# Patient Record
Sex: Female | Born: 1980 | Race: White | Hispanic: No | Marital: Married | Smoking: Never smoker
Health system: Southern US, Community
[De-identification: ages and names within clinical notes are randomized; demographics above are authoritative.]

## PROBLEM LIST (undated history)

## (undated) DIAGNOSIS — N390 Urinary tract infection, site not specified: Secondary | ICD-10-CM

## (undated) DIAGNOSIS — K760 Fatty (change of) liver, not elsewhere classified: Secondary | ICD-10-CM

## (undated) DIAGNOSIS — L509 Urticaria, unspecified: Secondary | ICD-10-CM

## (undated) DIAGNOSIS — R946 Abnormal results of thyroid function studies: Secondary | ICD-10-CM

## (undated) DIAGNOSIS — E782 Mixed hyperlipidemia: Secondary | ICD-10-CM

## (undated) DIAGNOSIS — J309 Allergic rhinitis, unspecified: Secondary | ICD-10-CM

## (undated) DIAGNOSIS — R5383 Other fatigue: Secondary | ICD-10-CM

## (undated) DIAGNOSIS — G43909 Migraine, unspecified, not intractable, without status migrainosus: Secondary | ICD-10-CM

## (undated) DIAGNOSIS — M25552 Pain in left hip: Secondary | ICD-10-CM

## (undated) DIAGNOSIS — A549 Gonococcal infection, unspecified: Secondary | ICD-10-CM

## (undated) DIAGNOSIS — R109 Unspecified abdominal pain: Secondary | ICD-10-CM

## (undated) DIAGNOSIS — R0789 Other chest pain: Secondary | ICD-10-CM

## (undated) DIAGNOSIS — N83209 Unspecified ovarian cyst, unspecified side: Secondary | ICD-10-CM

## (undated) DIAGNOSIS — N760 Acute vaginitis: Secondary | ICD-10-CM

## (undated) DIAGNOSIS — K219 Gastro-esophageal reflux disease without esophagitis: Secondary | ICD-10-CM

## (undated) DIAGNOSIS — R7611 Nonspecific reaction to tuberculin skin test without active tuberculosis: Secondary | ICD-10-CM

## (undated) DIAGNOSIS — F410 Panic disorder [episodic paroxysmal anxiety] without agoraphobia: Secondary | ICD-10-CM

## (undated) DIAGNOSIS — G510 Bell's palsy: Secondary | ICD-10-CM

## (undated) DIAGNOSIS — Z0000 Encounter for general adult medical examination without abnormal findings: Secondary | ICD-10-CM

## (undated) DIAGNOSIS — K589 Irritable bowel syndrome without diarrhea: Secondary | ICD-10-CM

## (undated) DIAGNOSIS — H469 Unspecified optic neuritis: Secondary | ICD-10-CM

## (undated) DIAGNOSIS — Z8669 Personal history of other diseases of the nervous system and sense organs: Secondary | ICD-10-CM

## (undated) DIAGNOSIS — L739 Follicular disorder, unspecified: Secondary | ICD-10-CM

## (undated) DIAGNOSIS — R768 Other specified abnormal immunological findings in serum: Secondary | ICD-10-CM

## (undated) HISTORY — DX: Pain in left hip: M25.552

## (undated) HISTORY — DX: Gastro-esophageal reflux disease without esophagitis: K21.9

## (undated) HISTORY — DX: Urticaria, unspecified: L50.9

## (undated) HISTORY — DX: Other chest pain: R07.89

## (undated) HISTORY — DX: Abnormal results of thyroid function studies: R94.6

## (undated) HISTORY — DX: Mixed hyperlipidemia: E78.2

## (undated) HISTORY — DX: Encounter for general adult medical examination without abnormal findings: Z00.00

## (undated) HISTORY — PX: RHINOPLASTY: SHX2354

## (undated) HISTORY — DX: Unspecified optic neuritis: H46.9

## (undated) HISTORY — DX: Unspecified abdominal pain: R10.9

## (undated) HISTORY — DX: Unspecified ovarian cyst, unspecified side: N83.209

## (undated) HISTORY — DX: Follicular disorder, unspecified: L73.9

## (undated) HISTORY — DX: Allergic rhinitis, unspecified: J30.9

## (undated) HISTORY — DX: Urinary tract infection, site not specified: N39.0

## (undated) HISTORY — DX: Acute vaginitis: N76.0

## (undated) HISTORY — DX: Migraine, unspecified, not intractable, without status migrainosus: G43.909

## (undated) HISTORY — DX: Gonococcal infection, unspecified: A54.9

## (undated) HISTORY — DX: Fatty (change of) liver, not elsewhere classified: K76.0

## (undated) HISTORY — DX: Other specified abnormal immunological findings in serum: R76.8

## (undated) HISTORY — PX: OTHER SURGICAL HISTORY: SHX169

## (undated) HISTORY — DX: Panic disorder (episodic paroxysmal anxiety): F41.0

## (undated) HISTORY — DX: Other fatigue: R53.83

## (undated) HISTORY — DX: Bell's palsy: G51.0

## (undated) HISTORY — PX: UPPER GASTROINTESTINAL ENDOSCOPY: SHX188

## (undated) HISTORY — DX: Nonspecific reaction to tuberculin skin test without active tuberculosis: R76.11

## (undated) HISTORY — DX: Personal history of other diseases of the nervous system and sense organs: Z86.69

## (undated) HISTORY — DX: Irritable bowel syndrome without diarrhea: K58.9

---

## 2004-07-02 HISTORY — PX: APPENDECTOMY: SHX54

## 2006-07-02 HISTORY — PX: BREAST ENHANCEMENT SURGERY: SHX7

## 2007-07-03 LAB — CONVERTED CEMR LAB

## 2010-06-01 ENCOUNTER — Ambulatory Visit: Payer: Self-pay | Admitting: Family Medicine

## 2010-06-01 ENCOUNTER — Telehealth: Payer: Self-pay | Admitting: Family Medicine

## 2010-06-01 DIAGNOSIS — K219 Gastro-esophageal reflux disease without esophagitis: Secondary | ICD-10-CM | POA: Insufficient documentation

## 2010-06-05 ENCOUNTER — Telehealth (INDEPENDENT_AMBULATORY_CARE_PROVIDER_SITE_OTHER): Payer: Self-pay | Admitting: *Deleted

## 2010-07-14 ENCOUNTER — Encounter: Payer: Self-pay | Admitting: Family Medicine

## 2010-07-23 ENCOUNTER — Encounter: Payer: Self-pay | Admitting: Orthopedic Surgery

## 2010-08-01 NOTE — Assessment & Plan Note (Signed)
Summary: NEW PT EST // RS   Vital Signs:  Patient profile:   30 year old female Height:      59.25 inches (150.50 cm) Weight:      106 pounds (48.18 kg) BMI:     21.31 O2 Sat:      100 % on Room air Temp:     98.3 degrees F (36.83 degrees C) oral Pulse rate:   72 / minute BP sitting:   106 / 75  (left arm) Cuff size:   regular  Vitals Entered By: Josph Macho RMA (June 01, 2010 8:14 AM)  O2 Flow:  Room air CC: Establish new patient/ Left hip pain X1 month/ CF Is Patient Diabetic? No   History of Present Illness: Patient is a 30 year old Hispanic female who is in today for a new patient appointment in complaining of left hip pain. She denies ever having a similar pain in the past. About a month ago she was running and stepped into a hole members tweaking her left hip slightly but it was not severe pain at that time. By that night terrors the pain intensified somewhat and she was wearing high heels and unfortunately since that time the pain has become progressively worse. 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 She reports the pain is primarily lateral hip initially this was the only place she had pain but then over time she developed pain around the posterior hip. She has a chronic baseline pain and with certain movements and twists the pain gets significantly sharper. Sometimes the pain that awakens her at night at times. She denies any radicular symptoms, weakness, numbness tingling or pain down into the leg or the foot. She denies any central back pain but does note the pain radiates up from her posterior hip into her left flank with certain movements. She has tried alternating some Tylenol and Ibuprofen but only in small doses infrequently. No relief noted.   She has a long history of gastritis which is doing well at the moment but has caused her  trouble intermittently since her teens. She even underwent upper endoscopy years ago due to a family h/o gastric CA and parents both had H Pylori gastritis in past.   Her other history is significant for right breast pain s/p augmentation and trouble with Migraines related to menses which are irregular. No recent illness/f/c/CP/palp/SOB/GI or GU c/o.  Preventive Screening-Counseling & Management  Alcohol-Tobacco     Smoking Status: never  Caffeine-Diet-Exercise     Does Patient Exercise: no      Drug Use:  no.    Current Medications (verified): 1)  None  Allergies (verified): No Known Drug Allergies  Past History:  Past Surgical History: elective rhinoplasty and cheek bones corrected at 30yo breast augmentaion in 2008 Appendectomy in 2006, enlarged upper endoscopy  Family History: Father: 42, BPH,  H Pylori gastritis Mother: 52, strokes, heart disease, hyperlipidemia, rheumatoid arthritis, uterine fibroids s/p hysterectomy in late 20s, fibrocystic breasts, H Pylori gastritis Siblings: 2 step brothers and 1 step sister MGM: 35, hyperlipidemia, hypertriglyceridemia, OA, possible RA, glaucoma MGF: deceased in mid 47s, skin cancer, prostate cancer, throat cancer, stomach cancer, gastritis PGM: 86, dementia, arthritis, thrombophlebitis PGF: deceased in 93s, MVA Children:  son: 29 yo, A[employer]W  Social History: Occupation: Engineer, 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  Occupation:  employed Smoking Status:  never Drug Use:  no Does Patient Exercise:  no  Review of Systems  The patient denies anorexia, fever, weight loss, weight gain, vision loss, decreased hearing, hoarseness, chest pain, syncope, dyspnea on exertion, peripheral edema, prolonged cough, headaches, hemoptysis, abdominal pain, melena, hematochezia, severe  indigestion/heartburn, hematuria, incontinence, genital sores, muscle weakness, suspicious skin lesions, transient blindness, difficulty walking, depression, unusual weight change, abnormal bleeding, and enlarged lymph nodes.    Physical Exam  General:  Well-developed,well-nourished,in no acute distress; alert,appropriate and cooperative throughout examination Head:  Normocephalic and atraumatic without obvious abnormalities. No apparent alopecia or balding. Eyes:  No corneal or conjunctival inflammation noted. EOMI.  Ears:  External ear exam shows no significant lesions or deformities.  Otoscopic examination reveals clear canals, tympanic membranes are intact bilaterally without bulging, retraction, inflammation or discharge. Hearing is grossly normal bilaterally. Nose:  External nasal examination shows no deformity or inflammation. Nasal mucosa are pink and moist without lesions or exudates. Mouth:  Oral mucosa and oropharynx without lesions or exudates.  Teeth in good repair. Neck:  No deformities, masses, or tenderness noted. Lungs:  Normal respiratory effort, chest expands symmetrically. Lungs are clear to auscultation, no crackles or wheezes. Heart:  Normal rate and regular rhythm. S1 and S2 normal without gallop, murmur, click, rub or other extra sounds. Abdomen:  Bowel sounds positive,abdomen soft and non-tender without masses, organomegaly or hernias noted. Msk:  left hip pain with palp over posterior hip and lateral hip, pain with straight leg raise over lateral hip and pain with internal and external of left hip. No pain with rotation right hip Pulses:  R and L carotid, dorsalis pedis and posterior tibial pulses are full and equal bilaterally Extremities:  No clubbing, cyanosis, edema, or deformity noted with normal full range of motion of all joints.   Neurologic:  No cranial nerve deficits noted. Station and gait are normal. Plantar reflexes are down-going bilaterally. DTRs are  symmetrical throughout. Sensory, motor and coordinative functions appear intact. Skin:  Intact without suspicious lesions or rashes Cervical Nodes:  No lymphadenopathy noted Psych:  Cognition and judgment appear intact. Alert and cooperative with normal attention span and concentration. No apparent delusions, illusions, hallucinations   Impression & Recommendations:  Problem # 1:  HIP PAIN, LEFT (ICD-719.45)  Her updated medication list for this problem includes:    Cyclobenzaprine Hcl 10 Mg Tabs (Cyclobenzaprine hcl) .Marland Kitchen... 1/2 to 1 tab by mouth three times a day as needed pain  muscle relaxer may cause sedation    Naproxen Sodium 220 Mg Tabs (Naproxen sodium) .Marland Kitchen... 1-2 tabs by mouth two times a day as needed pain with food  Orders: T-Hip Comp Left Min 2-views (73510TC) Depo- Medrol 40mg  (J1030) Admin of Therapeutic Inj  intramuscular or subcutaneous (96372) Moist heat and gentle stretching, call if symptoms worsen.  Problem # 2:  BREAST PAIN, RIGHT (ICD-611.71) since she had breast augmentation she has had chronic pain in right breast she says it ranges from a 4 to an 8 in intensity.  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 Will check CBC  Problem # 4:  GASTRITIS, HX OF (ICD-V12.79) Doing well at this time, will use Prilosec while using NSAIDs  Complete Medication List: 1)  Cyclobenzaprine Hcl 10 Mg Tabs (Cyclobenzaprine hcl) .... 1/2 to 1 tab by mouth three  times a day as needed pain  muscle relaxer may cause sedation 2)  Naproxen Sodium 220 Mg Tabs (Naproxen sodium) .Marland Kitchen.. 1-2 tabs by mouth two times a day as needed pain with food 3)  Prilosec 20 Mg Cpdr (Omeprazole) .Marland Kitchen.. 1 tab by mouth daily as needed gastritis or naproxen use 4)  Fish Oil 1000 Mg Caps (Omega-3 fatty acids) .Marland Kitchen.. 1 cap by mouth daily  Patient Instructions: 1)  You may move around but avoid painful motions. Apply moist heat three times a day and then  very gentle stretching to area as tolerated. 2)  Take 5001000 mg of tylenol every 6-8 hours as needed for relief of pain or comfort of fever. Avoid taking more than 3000 mg in a 24 hour period( can cause liver damage in higher doses). May alternate with the Naproxen. 3)  Call if not improving 4)  needs fasting labs  at [facility] in 4-6 weeks prior to visit, needs visit for annual check up/gyn visit after that 5)  FLP, Renal, hepatic, CBC, TSH, Sed rate, UA 6)  for v70.0 7)  Please give her the address and directions to [facility] on [address] to get the xray done today Prescriptions: CYCLOBENZAPRINE HCL 10 MG TABS (CYCLOBENZAPRINE HCL) 1/2 to 1 tab by mouth three times a day as needed pain  muscle relaxer may cause sedation  #45 x 1   Entered and Authorized by:   Danise Edge MD   Signed by:   Danise Edge MD on 06/01/2010   Method used:   [facility]ronically to        [facility] [address] (retail[facility]       [address]       [place], [place]  [address]       Ph: [phone] or [phone]       Fax: [phone]   RxID:   [id number]    Medication Administration  Injection # 1:    Medication: Depo- Medrol 40mg     Diagnosis: HIP PAIN, LEFT (ICD-719.45)    Route: IM    Site: LUOQ gluteus    Exp Date: 5/12    Lot #: [id number]    Mfr: Pharmacia    Patient tolerated injection without complications    Given by: Josph Macho RMA (June 01, 2010 10:06 AM)  Orders Added: 1)  T-Hip Comp Left Min 2-views [73510TC] 2)  Depo- Medrol 40mg  [J1030] 3)  Admin of Therapeutic Inj  intramuscular or subcutaneous [96372] 4)  New Patient Level IV [22025]    Preventive Care Screening  Last Tetanus Booster:    Date:  07/02/2008    Results:  Tdap   Pap Smear:    Date:  07/03/2007    Results:  historical

## 2010-08-01 NOTE — Progress Notes (Signed)
Summary: Ortho referral  Phone Note Call from Patient   Summary of Call: Pt called this morning stating her hip pain is still the same as last week. Pt would like the referral to Ortho? And for an MRI? Please advise? Initial call taken by: Josph Macho RMA,  June 05, 2010 8:11 AM  Follow-up for Phone Call        Please notify patient I put the orthopaedic referral through. They will likely want the MRI upon referral. Follow-up by: Danise Edge MD,  June 05, 2010 8:17 AM  Additional Follow-up for Phone Call Additional follow up Details #1::        Patient informed Additional Follow-up by: Josph Macho RMA,  June 05, 2010 8:58 AM

## 2010-08-01 NOTE — Progress Notes (Signed)
Summary: Hip pain  Phone Note Call from Patient Call back at [phone]   Caller: Patient Reason for Call: Talk to Nurse Summary of Call: Pt says her hip pain has gotten worse, pls advise Initial call taken by: Lannette Donath,  June 01, 2010 2:43 PM  Follow-up for Phone Call        Patient states she didn't notice any relief from the Depo injection. Pt  also states she did take the muscle relaxer (Cyclobenzaprine) an hour ago and is just very "sleepy" now, pain is still the same. Pt is just wandering how long it will take for the Depo or the muscle relaxer to work? pt did do the xray around 11am this morning. Follow-up by: Josph Macho RMA,  June 01, 2010 2:57 PM  Additional Follow-up for Phone Call Additional follow up Details #1::        Call in Tramadol 50mg  1-2q hrs as needed for pain #15.  No RF.  Inform pt that this could cause more sedation and the depo can take 16-24 hours to work. / per Dr Milinda Cave.  Left a detailed message on pts spouses cell phone [phone] Additional Follow-up by: Josph Macho RMA,  June 01, 2010 4:42 PM    New/Updated Medications: TRAMADOL HCL 50 MG TABS (TRAMADOL HCL) 1-2 q 6 hours as needed for pain Prescriptions: TRAMADOL HCL 50 MG TABS (TRAMADOL HCL) 1-2 q 6 hours as needed for pain  #15 x 0   Entered by:   Josph Macho RMA   Authorized by:   Michell Heinrich M.D.   Signed by:   Josph Macho RMA on 06/01/2010   Method used:   [facility]        [facility]  [place] [address] [facility]       [address]       [place], [place]  [address]       Ph: [phone] or [phone]       Fax: [phone]   RxID:   [id number]   Appended Document: Hip pain Pt would like to know if this is bursitis since nothing showed on xray? States that is what was mentioned to her during the visit? pt would also like to know what the next steps would be? Does she need to get a steriod injection?  Appended Document: Hip pain If patient  is too uncomfortable we can give her a referral to ortho for further eval or she can call us on Monday for same if no better.  Appended Document: Hip pain Pt states she is feeling a little better.Marland Kitchen She will call us Monday if she decides she needs to proceed with the referral

## 2010-10-26 ENCOUNTER — Encounter: Payer: Self-pay | Admitting: Family Medicine

## 2010-10-26 ENCOUNTER — Ambulatory Visit (INDEPENDENT_AMBULATORY_CARE_PROVIDER_SITE_OTHER): Payer: Managed Care, Other (non HMO) | Admitting: Family Medicine

## 2010-10-26 VITALS — BP 112/75 | HR 67 | Temp 99.2°F | Ht 59.25 in | Wt 112.5 lb

## 2010-10-26 DIAGNOSIS — H8120 Vestibular neuronitis, unspecified ear: Secondary | ICD-10-CM

## 2010-10-26 MED ORDER — PROMETHAZINE HCL 50 MG/ML IJ SOLN
12.5000 mg | Freq: Once | INTRAMUSCULAR | Status: AC
  Administered 2010-10-26: 15 mg via INTRAMUSCULAR

## 2010-10-26 MED ORDER — PROMETHAZINE HCL 12.5 MG RE SUPP: 12.5000 mg | Freq: Four times a day (QID) | RECTAL | Status: AC | PRN

## 2010-10-26 MED ORDER — MECLIZINE HCL 32 MG PO TABS: 32.0000 mg | ORAL_TABLET | Freq: Three times a day (TID) | ORAL | Status: AC | PRN

## 2010-10-26 MED ORDER — PROMETHAZINE HCL 12.5 MG PO TABS: 12.5000 mg | ORAL_TABLET | Freq: Four times a day (QID) | ORAL | Status: AC | PRN

## 2010-10-26 NOTE — Assessment & Plan Note (Addendum)
DDX: vestibular neuronitis, meniere's dz, migrainous vertigo, early stage of zoster affecting 8th CN, BPPV, CNS lesion (less likely). Her ear pain and ringing fit more with meniere's dz, but her disease course at this point can't be described as RECURRENT/EPISODIC. Also, her ear pain (and possibly her episodic HA's) could be a totally separate issue from her current vertiginous illness (like a manifestation of TMJ syndrome on the left--she does grind her teeth a lot in her sleep per husband).    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   Discussed self-limited nature of vestibular neuronitis, viral etiology. Symptomatic care discussed: phenergan for nausea (12.5mg  IM in office, rx for pills/supp given), meclizine for dizziness. Out of work today and tomorrow.  Call if not significantly improved in 3-4 days or if new problems or worsening of illness occur.

## 2010-10-26 NOTE — Progress Notes (Addendum)
OFFICE VISIT  10/26/2010   CC:  Chief Complaint  Patient presents with  . Dizziness    since 6 this morning  . Headache    X 1 day  . Otalgia    X 1 day, left      HPI:    Patient is a 30 y.o. Hispanic female who presents for nausea and dizziness. Feeling tired/fatigued last 2-3 days, then got mild HA yesterday--like one of her typical HA's (left sided predominantly, with ear aching), ears were ringing. Upon waking up this morning at 6AM, her headache was gone but she had severe sensation of the room spinning, severe nausea but no vomiting, and still having left ear pain and some ringing (milder than yesterday; she's unable to localize the ringing to one side).  The vertigo is constant but is slightly improved by lying down and made a bit worse by getting up and walking (this is NOT a strictly positional vertigo she describes).  She says she feels like she is falling/veering to one side when she tries to walk. No known fever, no ear drainage.  She denies ST or cough but has hay fever symptoms that have been no worse than her usual lately.  Took excedrin yesterday but none today.  Took a loratidine tab today. No vision complaints.  Hearing feels normal.  She says she has a family member with meniere's dz (aunt).  She does report a similar occurrence of an episode like this about 2 yrs ago and says it lasted only an hour or so.    Of note, she is seeing a chiropracter for her left hip pain and says she no longer takes tramadol, flexeril, or naproxen.  Past Medical History  Diagnosis Date  . Migraines   Left hip pain 06/2010  Past Surgical History  Procedure Date  . Rhinoplasty     at 30 yo  . Cheek bones corrected     at 30 yo  . Breast enhancement surgery 2008  . Appendectomy 2006    Enlarged  . Upper gastrointestinal endoscopy    Family History  Problem Relation Age of Onset  . Stroke Mother   . Heart disease Mother   . Hyperlipidemia Mother   . Rheum arthritis Mother    . Fibroids Mother     Uterine  . Fibrocystic breast disease Mother   . Hyperlipidemia Maternal Grandmother   . Osteoarthritis Maternal Grandmother   . Glaucoma Maternal Grandmother   . Cancer Maternal Grandfather     Skin, prostate, throat, stomach   History   Social History  . Marital Status: Married    Spouse Name: N/A    Number of Children: N/A  . Years of Education: N/A   Occupational History  . Not on file.   Social History Main Topics  . Smoking status: Never Smoker   . Smokeless tobacco: Never Used  . Alcohol Use: 0.0 oz/week    0 drink(s) per week     ocassional  . Drug Use: No  . Sexually Active: Yes -- Female partner(s)   Other Topics Concern  . Not on file   Social History Narrative  . 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   Outpatient Prescriptions Prior to Visit  Medication Sig Dispense Refill  . naproxen sodium (ANAPROX) 220 MG tablet Take 220 mg by mouth 2 (two) times daily between meals as needed. For pain/ take with food       .  omeprazole (PRILOSEC) 20 MG capsule Take 20 mg by mouth daily.        . cyclobenzaprine (FLEXERIL) 10 MG tablet Take 10 mg by mouth 3 (three) times daily as needed. For pain       . fish oil-omega-3 fatty acids 1000 MG capsule Take 1,000 mg by mouth daily.        . traMADol (ULTRAM) 50 MG tablet Take 50 mg by mouth every 6 (six) hours as needed. For pain        No facility-administered medications prior to visit.    No Known Allergies  ROS As per HPI.  Also, no abdominal pain, no rash, no joint pains, no CP, no SOB.  PE: Blood pressure 112/75, pulse 67, temperature 99.2 F (37.3 C), temperature source Oral, height 4' 11.25" (1.505 m), weight 112 lb 8 oz (51.03 kg), last menstrual period 10/06/2010, SpO2 99.00%. GEN: alert, lying on exam bed on her side, tired appearing but NAD.  Oriented x 4.  Female companion in room with her. HEENT: Scalp without lesions or hair loss.  Ears: EACs: moderate  cerumen bilat, but TMs viewed fine and appear normal.  Mild tenderness on left with insertion of ear speculum.   Eyes: no injection, icteris, swelling, or exudate.  EOMI, PERRLA. Nose: no drainage or turbinate edema/swelling.  No injection or focal lesion.  Mouth: lips without lesion/swelling.  Oral mucosa pink and moist.  Dentition intact and without obvious caries or gingival swelling.  Oropharynx without erythema, exudate, or swelling.  Neck: supple, ROM full. No lymphadenopathy, thyromegaly, or mass. Chest: symmetric expansion, nonlabored respirations.  Clear and equal breath sounds in all lung fields.   CV: RRR, no m/r/g.  Peripheral pulses 2+ and symmetric. EXT: no clubbing, cyanosis, or edema.  NEURO: CN 2-12 intact grossly bilaterally.  No focal weakness.  Unsteady gait. Extraocular motion caused the patient intense increase in her nausea so much that testing for nystagmus was limited, but I did question some horizontal nystagmus.   LABS:  none  IMPRESSION AND PLAN:  Vestibular neuronitis DDX: vestibular neuronitis, meniere's dz, migrainous vertigo, early stage of zoster affecting 8th CN, BPPV, CNS lesion (less likely). Her ear pain and ringing fit more with meniere's dz, but her disease course at this point can't be described as RECURRENT/EPISODIC. Also, her ear pain (and possibly her episodic HA's) could be a totally separate issue from her current vertiginous illness (like a manifestation of TMJ syndrome on the left--she does grind her teeth a lot in her sleep per husband).    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   Discussed self-limited nature of vestibular neuronitis, viral etiology. Symptomatic care discussed: phenergan for nausea (12.5mg  IM in office, rx for pills/supp given), meclizine for dizziness. Out of work today and tomorrow.   Call if not significantly improved in 3-4 days or if new problems or worsening of illness occur.     FOLLOW UP: Return if symptoms worsen or fail to improve.

## 2010-10-30 ENCOUNTER — Telehealth: Payer: Self-pay | Admitting: Family Medicine

## 2010-10-30 NOTE — Telephone Encounter (Signed)
Please advise 

## 2010-10-30 NOTE — Telephone Encounter (Signed)
Patient still having dizziness. Husband [place] would like to know how long is this suppose to last.

## 2010-10-31 NOTE — Telephone Encounter (Signed)
Her dizziness should be MUCH better if not gone by now.  I called the home phone # we have in her demographics and LMOM to call back so I could get some specifics from her.

## 2011-01-17 ENCOUNTER — Ambulatory Visit (INDEPENDENT_AMBULATORY_CARE_PROVIDER_SITE_OTHER): Payer: Managed Care, Other (non HMO) | Admitting: Family Medicine

## 2011-01-17 ENCOUNTER — Encounter: Payer: Self-pay | Admitting: Family Medicine

## 2011-01-17 VITALS — BP 113/75 | HR 67 | Temp 98.6°F | Ht 59.25 in | Wt 114.1 lb

## 2011-01-17 DIAGNOSIS — J029 Acute pharyngitis, unspecified: Secondary | ICD-10-CM

## 2011-01-17 DIAGNOSIS — J40 Bronchitis, not specified as acute or chronic: Secondary | ICD-10-CM

## 2011-01-17 DIAGNOSIS — J209 Acute bronchitis, unspecified: Secondary | ICD-10-CM

## 2011-01-17 MED ORDER — AZITHROMYCIN 250 MG PO TABS: ORAL_TABLET | ORAL | Status: AC

## 2011-01-17 MED ORDER — BENZONATATE 100 MG PO CAPS: 100.0000 mg | ORAL_CAPSULE | Freq: Three times a day (TID) | ORAL | Status: DC | PRN

## 2011-01-17 NOTE — Patient Instructions (Signed)
Bronchitis Bronchitis is the body's way of reacting to injury and/or infection (inflammation) of the bronchi. Bronchi are the air tubes that extend from the windpipe into the lungs. If the inflammation becomes severe, it may cause shortness of breath.  CAUSES Inflammation may be caused by:  A virus.   Germs (bacteria).   Dust.   Allergens.   Pollutants and many other irritants.  The cells lining the bronchial tree are covered with tiny hairs (cilia). These constantly beat upward, away from the lungs, toward the mouth. This keeps the lungs free of pollutants. When these cells become too irritated and are unable to do their job, mucus begins to develop. This causes the characteristic cough of bronchitis. The cough clears the lungs when the cilia are unable to do their job. Without either of these protective mechanisms, the mucus would settle in the lungs. Then you would develop pneumonia. Smoking is a common cause of bronchitis and can contribute to pneumonia. Stopping this habit is the single most important thing you can do to help yourself. TREATMENT  Your caregiver may prescribe an antibiotic if the cough is caused by bacteria. Also, medicines that open up your airways make it easier to breathe. Your caregiver may also recommend or prescribe an expectorant. It will loosen the mucus to be coughed up. Only take over-the-counter or prescription medicines for pain, discomfort, or fever as directed by your caregiver.   Removing whatever causes the problem (smoking, for example) is critical to preventing the problem from getting worse.   Cough suppressants may be prescribed for relief of cough symptoms.   Inhaled medicines may be prescribed to help with symptoms now and to help prevent problems from returning.   For those with recurrent (chronic) bronchitis, there may be a need for steroid medicines.  SEEK IMMEDIATE MEDICAL CARE IF:  During treatment, you develop more pus-like mucus  (purulent sputum).   You or your child has an oral temperature above 104, not controlled by medicine.   Your baby is older than 3 months with a rectal temperature of 102 F (38.9 C) or higher.   Your baby is 57 months old or younger with a rectal temperature of 100.4 F (38 C) or higher.   You become progressively more ill.   You have increased difficulty breathing, wheezing, or shortness of breath.  It is necessary to seek immediate medical care if you are elderly or sick from any other disease. MAKE SURE YOU:  Understand these instructions.   Will watch your condition.   Will get help right away if you are not doing well or get worse.  Document Released: 06/18/2005 Document Re-Released: 09/12/2009 [facility] Patient Information 2011 [place], [place].

## 2011-01-18 ENCOUNTER — Encounter: Payer: Self-pay | Admitting: Family Medicine

## 2011-01-18 NOTE — Progress Notes (Signed)
Danielle Sims [id number] [birth year] 01/18/2011      Progress Note-Follow Up  Subjective  Chief Complaint  Chief Complaint  Patient presents with  . Bronchitis    chest tightness, sore throat, cough, lost voice X 2 days    HPI  Patient is a 30 year old female in today with 2 days worth of chest congestion, cough, clear to yellowish rhinorrhea, hoarseness and sore throat. She's also noted some mild nausea and malaise as well as some low-grade chills. No obvious fevers, ear pain, headache or chest pain. No anorexia or nausea. She does work in a pediatric office and it had some recent cases of strep throat come through. No abdominal pain, diarrhea. She has not taken any medications to help her with her symptoms and her cough did awaken her on several occasions last night.  Past Medical History  Diagnosis Date  . Migraines   . Bronchitis, acute 01/18/2011    Past Surgical History  Procedure Date  . Rhinoplasty     at 30 yo  . Cheek bones corrected     at 30 yo  . Breast enhancement surgery 2008  . Appendectomy 2006    Enlarged  . Upper gastrointestinal endoscopy     Family History  Problem Relation Age of Onset  . Stroke Mother   . Heart disease Mother   . Hyperlipidemia Mother   . Rheum arthritis Mother   . Fibroids Mother     Uterine  . Fibrocystic breast disease Mother   . Hyperlipidemia Maternal Grandmother   . Osteoarthritis Maternal Grandmother   . Glaucoma Maternal Grandmother   . Cancer Maternal Grandfather     Skin, prostate, throat, stomach    History   Social History  . Marital Status: Married    Spouse Name: N/A    Number of Children: N/A  . Years of Education: N/A   Occupational History  . Not on file.   Social History Main Topics  . Smoking status: Never Smoker   . Smokeless tobacco: Never Used  . Alcohol Use: 0.0 oz/week    0 drink(s) per week     ocassional  . Drug Use: No  . Sexually Active: Yes -- Female partner(s)   Other Topics Concern    . Not on file   Social History Narrative  . No narrative on file    Current Outpatient Prescriptions on File Prior to Visit  Medication Sig Dispense Refill  . aspirin-acetaminophen-caffeine (EXCEDRIN MIGRAINE) 250-250-65 MG per tablet Take 1 tablet by mouth every 6 (six) hours as needed.        Marland Kitchen omeprazole (PRILOSEC) 20 MG capsule Take 20 mg by mouth daily.        Marland Kitchen loratadine (CLARITIN) 10 MG tablet Take 10 mg by mouth daily.        . naproxen sodium (ANAPROX) 220 MG tablet Take 220 mg by mouth 2 (two) times daily between meals as needed. For pain/ take with food         No Known Allergies  Review of Systems  Review of Systems  Constitutional: Positive for chills and malaise/fatigue. Negative for fever.  HENT: Positive for congestion and sore throat. Negative for nosebleeds.   Eyes: Negative for discharge.  Respiratory: Positive for cough. Negative for sputum production, shortness of breath and wheezing.   Cardiovascular: Negative for chest pain, palpitations and leg swelling.  Gastrointestinal: Positive for nausea. Negative for heartburn, vomiting, abdominal pain, diarrhea, constipation, blood in stool and melena.  Genitourinary: Negative for dysuria.  Musculoskeletal: Negative for myalgias and falls.  Skin: Negative for rash.  Neurological: Negative for loss of consciousness and headaches.  Endo/Heme/Allergies: Negative for polydipsia.  Psychiatric/Behavioral: Negative for depression and suicidal ideas. The patient is not nervous/anxious and does not have insomnia.     Objective  BP 113/75  Pulse 67  Temp(Src) 98.6 F (37 C) (Oral)  Ht 4' 11.25" (1.505 m)  Wt 114 lb 1.9 oz (51.764 kg)  BMI 22.86 kg/m2  SpO2 99%  LMP 12/25/2010  Physical Exam See below    Assessment & Plan  Bronchitis, acute Patient is in with several days worth of SOB, cough, hoarseness and irritation. 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 Will treat with Tessalon Perles for the cough and a  Zpack. Call if symptoms worsen    Physical Exam  Constitutional: She is oriented to person, place, and time and well-developed, well-nourished, and in no distress. No distress.  HENT:  Head: Normocephalic and atraumatic.  Eyes: Conjunctivae are normal.  Neck: Neck supple. No thyromegaly present.  Cardiovascular: Normal rate, regular rhythm and normal heart sounds.   No murmur heard. Pulmonary/Chest: Effort normal and breath sounds normal. She has no wheezes.       Slight crackle rul  Abdominal: She exhibits no distension and no mass.  Musculoskeletal: She exhibits no edema.  Lymphadenopathy:    She has no cervical adenopathy.  Neurological: She is alert and oriented to person, place, and time.  Skin: Skin is warm and dry. No rash noted. She is not diaphoretic.  Psychiatric: Memory, affect and judgment normal.

## 2011-01-18 NOTE — Assessment & Plan Note (Signed)
Patient is in with several days worth of SOB, cough, hoarseness and irritation. 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 Will treat with Tessalon Perles for the cough and a Zpack. Call if symptoms worsen

## 2011-01-19 ENCOUNTER — Encounter: Payer: Self-pay | Admitting: *Deleted

## 2011-01-19 ENCOUNTER — Telehealth: Payer: Self-pay | Admitting: Family Medicine

## 2011-01-19 NOTE — Telephone Encounter (Signed)
Patient not getting any better on her 3rd day of Z pack she has been taking her cough medicine also . She is at home from work today and would like a note for work faxed to her home at [phone].

## 2011-01-19 NOTE — Telephone Encounter (Signed)
Letter printed.  I have attempted to contact this patient by phone with the following results: left message for husband, [place] to return my call on answering machine (home).

## 2011-01-19 NOTE — Telephone Encounter (Signed)
OK to give her a work note to keep her out until Monday and fax to her home. Remind her the Zpak will work for a full ten days but if this is viral it will just take 7 -14 days to run its course. She can call next week if she thinks she needs to be seen again

## 2011-01-19 NOTE — Telephone Encounter (Signed)
RC from husband, advised of below and faxed note to number given.

## 2011-02-09 ENCOUNTER — Other Ambulatory Visit: Payer: Self-pay | Admitting: Physician Assistant

## 2011-02-09 DIAGNOSIS — G44009 Cluster headache syndrome, unspecified, not intractable: Secondary | ICD-10-CM

## 2011-02-09 DIAGNOSIS — R42 Dizziness and giddiness: Secondary | ICD-10-CM

## 2011-02-11 ENCOUNTER — Ambulatory Visit
Admission: RE | Admit: 2011-02-11 | Discharge: 2011-02-11 | Disposition: A | Discharge status: Home / Self Care | Payer: Managed Care, Other (non HMO) | Source: Ambulatory Visit | Attending: Physician Assistant | Admitting: Physician Assistant

## 2011-02-11 DIAGNOSIS — G44009 Cluster headache syndrome, unspecified, not intractable: Secondary | ICD-10-CM

## 2011-02-11 DIAGNOSIS — R42 Dizziness and giddiness: Secondary | ICD-10-CM

## 2011-02-11 MED ORDER — GADOBENATE DIMEGLUMINE 529 MG/ML IV SOLN
10.0000 mL | Freq: Once | INTRAVENOUS | Status: AC | PRN
  Administered 2011-02-11: 10 mL via INTRAVENOUS

## 2011-02-21 ENCOUNTER — Ambulatory Visit: Payer: Managed Care, Other (non HMO) | Admitting: Family Medicine

## 2011-05-31 ENCOUNTER — Encounter: Payer: Self-pay | Admitting: Family Medicine

## 2011-05-31 ENCOUNTER — Ambulatory Visit (INDEPENDENT_AMBULATORY_CARE_PROVIDER_SITE_OTHER): Payer: Managed Care, Other (non HMO) | Admitting: Family Medicine

## 2011-05-31 ENCOUNTER — Encounter: Payer: Self-pay | Admitting: Internal Medicine

## 2011-05-31 DIAGNOSIS — K5289 Other specified noninfective gastroenteritis and colitis: Secondary | ICD-10-CM

## 2011-05-31 DIAGNOSIS — R109 Unspecified abdominal pain: Secondary | ICD-10-CM

## 2011-05-31 DIAGNOSIS — R102 Pelvic and perineal pain: Secondary | ICD-10-CM

## 2011-05-31 DIAGNOSIS — R1110 Vomiting, unspecified: Secondary | ICD-10-CM

## 2011-05-31 DIAGNOSIS — K529 Noninfective gastroenteritis and colitis, unspecified: Secondary | ICD-10-CM

## 2011-05-31 DIAGNOSIS — K219 Gastro-esophageal reflux disease without esophagitis: Secondary | ICD-10-CM

## 2011-05-31 DIAGNOSIS — Z8719 Personal history of other diseases of the digestive system: Secondary | ICD-10-CM

## 2011-05-31 DIAGNOSIS — K589 Irritable bowel syndrome without diarrhea: Secondary | ICD-10-CM

## 2011-05-31 DIAGNOSIS — D510 Vitamin B12 deficiency anemia due to intrinsic factor deficiency: Secondary | ICD-10-CM

## 2011-05-31 HISTORY — DX: Gastro-esophageal reflux disease without esophagitis: K21.9

## 2011-05-31 HISTORY — DX: Irritable bowel syndrome, unspecified: K58.9

## 2011-05-31 LAB — RENAL FUNCTION PANEL
BUN: 14 mg/dL (ref 6–23)
Creatinine, Ser: 0.6 mg/dL (ref 0.4–1.2)
GFR: 132.5 mL/min (ref >60.00)
Phosphorus: 3.2 mg/dL (ref 2.3–4.6)
Sodium: 140 mEq/L (ref 135–145)

## 2011-05-31 LAB — POCT URINALYSIS DIPSTICK
Nitrite, UA: NEGATIVE
Protein, UA: NEGATIVE
Urobilinogen, UA: 0.2

## 2011-05-31 LAB — CBC
HCT: 39.8 % (ref 36.0–46.0)
MCV: 91.4 fl (ref 78.0–100.0)
RDW: 14.1 % (ref 11.5–14.6)
WBC: 8.2 10*3/uL (ref 4.5–10.5)

## 2011-05-31 LAB — HEPATIC FUNCTION PANEL
Albumin: 4.2 g/dL (ref 3.5–5.2)
Total Bilirubin: 0.5 mg/dL (ref 0.3–1.2)

## 2011-05-31 LAB — H. PYLORI ANTIBODY, IGG: H Pylori IgG: NEGATIVE

## 2011-05-31 LAB — SEDIMENTATION RATE: Sed Rate: 20 mm/hr (ref 0–22)

## 2011-05-31 LAB — VITAMIN B12: Vitamin B-12: 384 pg/mL (ref 211–911)

## 2011-05-31 MED ORDER — PROMETHAZINE HCL 25 MG PO TABS: 25.0000 mg | ORAL_TABLET | Freq: Four times a day (QID) | ORAL | Status: AC | PRN

## 2011-05-31 NOTE — Assessment & Plan Note (Addendum)
Last endoscopy was roughly 5 years ago and she has a family history of Gastric CA in a maternal aunt who died at 87 and a FH of a MGF who died at 17 from colon cancer so she is referred today to Gastroenterology for further surveillance. Can continue Omeprazole prn

## 2011-05-31 NOTE — Progress Notes (Signed)
Danielle Sims [id number] [birth year] 05/31/2011      Progress Note-Follow Up  Subjective  Chief Complaint  Chief Complaint  Patient presents with  . Emesis    dry hives  . stomach discomfort    this morning- cramps in lower abdomen  . Diarrhea    started at 1:00 this morning    HPI  30 year old female who is in today with sudden onset of nausea vomiting and diarrhea overnight. She went to bed last night without any symptoms and woke up at 1 AM nausea has vomited small amounts 3 times but has also had 7-8 loose stool tonight. Unfortunately she has now rectal irritation and burning and occasional spots of blood on the tissue as well. She has been struggling with nausea and heaving. No blood in the vomitus. She does describe most her, pain is lower. She does have a history of gastritis and the pain is usually higher. She has had an appendectomy in the past she had an upper endoscopy roughly 5 years ago and she reports a colonoscopy was recommended for 2 years ago due to a family history of a maternal aunt who died at age 61 with gastric cancer and a maternal grandfather who died at 45 of colon cancer. She has not proceeded. Of note she has a father who has previously been H. pylori positive with his gastritis but a mother who has had H. pylori negative with his gastritis. She has felt flushed and cold but no obvious fevers. She has not routinely been taking anti-inflammatories although she did take a dose of Excedrin migraine earlier in the week but just once. She has gone very few fluids down overnight but was eating and drinking normally prior to and after the clear  Past Medical History  Diagnosis Date  . Migraines   . Bronchitis, acute 01/18/2011    Past Surgical History  Procedure Date  . Rhinoplasty     at 30 yo  . Cheek bones corrected     at 30 yo  . Breast enhancement surgery 2008  . Appendectomy 2006    Enlarged  . Upper gastrointestinal endoscopy     Family History  Problem  Relation Age of Onset  . Stroke Mother   . Heart disease Mother   . Hyperlipidemia Mother   . Rheum arthritis Mother   . Fibroids Mother     Uterine  . Fibrocystic breast disease Mother   . Hyperlipidemia Maternal Grandmother   . Osteoarthritis Maternal Grandmother   . Glaucoma Maternal Grandmother   . Cancer Maternal Grandfather     Skin, prostate, throat, stomach    History   Social History  . Marital Status: Married    Spouse Name: N/A    Number of Children: N/A  . Years of Education: N/A   Occupational History  . Not on file.   Social History Main Topics  . Smoking status: Never Smoker   . Smokeless tobacco: Never Used  . Alcohol Use: 0.0 oz/week    0 drink(s) per week     ocassional  . Drug Use: No  . Sexually Active: Yes -- Female partner(s)   Other Topics Concern  . Not on file   Social History Narrative  . No narrative on file    Current Outpatient Prescriptions on File Prior to Visit  Medication Sig Dispense Refill  . aspirin-acetaminophen-caffeine (EXCEDRIN MIGRAINE) 250-250-65 MG per tablet Take 1 tablet by mouth every 6 (six) hours as needed.        Marland Kitchen  omeprazole (PRILOSEC) 20 MG capsule Take 20 mg by mouth daily.          No Known Allergies  Review of Systems  Review of Systems  Constitutional: Negative for fever and malaise/fatigue.  HENT: Negative for congestion.   Eyes: Negative for discharge.  Respiratory: Negative for shortness of breath.   Cardiovascular: Negative for chest pain, palpitations and leg swelling.  Gastrointestinal: Negative for nausea, abdominal pain and diarrhea.  Genitourinary: Negative for dysuria.  Musculoskeletal: Negative for falls.  Skin: Negative for rash.  Neurological: Negative for loss of consciousness and headaches.  Endo/Heme/Allergies: Negative for polydipsia.  Psychiatric/Behavioral: Negative for depression and suicidal ideas. The patient is not nervous/anxious and does not have insomnia.      Objective  BP 102/69  Pulse 70  Temp(Src) 98.3 F (36.8 C) (Oral)  Ht 4' 11.25" (1.505 m)  Wt 113 lb (51.256 kg)  BMI 22.63 kg/m2  SpO2 100%  LMP 05/22/2011  Physical Exam  Physical Exam  Constitutional: She is oriented to person, place, and time and well-developed, well-nourished, and in no distress. No distress.  HENT:  Head: Normocephalic and atraumatic.  Eyes: Conjunctivae are normal.  Neck: Neck supple. No thyromegaly present.  Cardiovascular: Normal rate, regular rhythm and normal heart sounds.   No murmur heard. Pulmonary/Chest: Effort normal and breath sounds normal. She has no wheezes.  Abdominal: She exhibits no distension and no mass.  Musculoskeletal: She exhibits no edema.  Lymphadenopathy:    She has no cervical adenopathy.  Neurological: She is alert and oriented to person, place, and time.  Skin: Skin is warm and dry. No rash noted. She is not diaphoretic.  Psychiatric: Memory, affect and judgment normal.       Assessment & Plan  Gastroenteritis Abrupt onset of symptoms about 9 hours ago with nausea, vomitting and diarrhea. 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 We will check labs including CBC, sed rate, UA and H Pylori. She has a family history of family members with H Pylori although her personal testing has been negative in the past. She is sent home with promethazine to use and push fluids and rest. Taken out of work for today. Seek further care if symptoms worsen  ANEMIA, PERNICIOUS Patient no longer taking her shots and is instead taking Vitamin B12 2500mg  SL daily. Will recheck Vitamin B 12 levels with blood draw today  Suprapubic pain Will check a UA today  GASTRITIS, HX OF Last endoscopy was roughly 5 years ago and she has a family history of Gastric CA in a maternal aunt who died at 45 and a FH of a MGF who died at 87 from colon cancer so she is referred today to Gastroenterology for further  surveillance. Can continue Omeprazole prn

## 2011-05-31 NOTE — Assessment & Plan Note (Signed)
Will check a UA today

## 2011-05-31 NOTE — Assessment & Plan Note (Signed)
Patient no longer taking her shots and is instead taking Vitamin B12 2500mg  SL daily. Will recheck Vitamin B 12 levels with blood draw today

## 2011-05-31 NOTE — Patient Instructions (Signed)
Viral Gastroenteritis Viral gastroenteritis is also known as stomach flu. This condition affects the stomach and intestinal tract. The illness typically lasts 3 to 8 days. Most people develop an immune response. This eventually gets rid of the virus. While this natural response develops, the virus can make you quite ill.  CAUSES  Diarrhea and vomiting are often caused by a virus. Medicines (antibiotics) that kill germs will not help unless there is also a germ (bacterial) infection. SYMPTOMS  The most common symptom is diarrhea. This can cause severe loss of fluids (dehydration) and body salt (electrolyte) imbalance. TREATMENT  Treatments for this illness are aimed at rehydration. Antidiarrheal medicines are not recommended. They do not decrease diarrhea volume and may be harmful. Usually, home treatment is all that is needed. The most serious cases involve vomiting so severely that you are not able to keep down fluids taken by mouth (orally). In these cases, intravenous (IV) fluids are needed. Vomiting with viral gastroenteritis is common, but it will usually go away with treatment. HOME CARE INSTRUCTIONS  Small amounts of fluids should be taken frequently. Large amounts at one time may not be tolerated. Plain water may be harmful in infants and the elderly. Oral rehydration solutions (ORS) are available at pharmacies and grocery stores. ORS replace water and important electrolytes in proper proportions. Sports drinks are not as effective as ORS and may be harmful due to sugars worsening diarrhea.  As a general guideline for children, replace any new fluid losses from diarrhea or vomiting with ORS as follows:   If your child weighs 22 pounds or under (10 kg or less), give 60-120 mL (1/4 - 1/2 cup or 2 - 4 ounces) of ORS for each diarrheal stool or vomiting episode.   If your child weighs more than 22 pounds (more than 10 kgs), give 120-240 mL (1/2 - 1 cup or 4 - 8 ounces) of ORS for each diarrheal  stool or vomiting episode.   In a child with vomiting, it may be helpful to give the above ORS replacement in 5 mL (1 teaspoon) amounts every 5 minutes, then increase as tolerated.   While correcting for dehydration, children should eat normally. However, foods high in sugar should be avoided because this may worsen diarrhea. Large amounts of carbonated soft drinks, juice, gelatin desserts, and other highly sugared drinks should be avoided.   After correction of dehydration, other liquids that are appealing to the child may be added. Children should drink small amounts of fluids frequently and fluids should be increased as tolerated.   Adults should eat normally while drinking more fluids than usual. Drink small amounts of fluids frequently and increase as tolerated. Drink enough water and fluids to keep your urine clear or pale yellow. Broths, weak decaffeinated tea, lemon-lime soft drinks (allowed to go flat), and ORS replace fluids and electrolytes.   Avoid:   Carbonated drinks.   Juice.   Extremely hot or cold fluids.   Caffeine drinks.   Fatty, greasy foods.   Alcohol.   Tobacco.   Too much intake of anything at one time.   Gelatin desserts.   Probiotics are active cultures of beneficial bacteria. They may lessen the amount and number of diarrheal stools in adults. Probiotics can be found in yogurt with active cultures and in supplements.   Wash your hands well to avoid spreading bacteria and viruses.   Antidiarrheal medicines are not recommended for infants and children.   Only take over-the-counter or prescription medicines for   pain, discomfort, or fever as directed by your caregiver. Do not give aspirin to children.   For adults with dehydration, ask your caregiver if you should continue all prescribed and over-the-counter medicines.   If your caregiver has given you a follow-up appointment, it is very important to keep that appointment. Not keeping the appointment  could result in a lasting (chronic) or permanent injury and disability. If there is any problem keeping the appointment, you must call to reschedule.  SEEK IMMEDIATE MEDICAL CARE IF:   You are unable to keep fluids down.   There is no urine output in 6 to 8 hours or there is only a small amount of very dark urine.   You develop shortness of breath.   There is blood in the vomit (may look like coffee grounds) or stool.   Belly (abdominal) pain develops, increases, or localizes.   There is persistent vomiting or diarrhea.   You have a fever.   Your baby is older than 3 months with a rectal temperature of 102 F (38.9 C) or higher.   Your baby is 84 months old or younger with a rectal temperature of 100.4 F (38 C) or higher.  MAKE SURE YOU:   Understand these instructions.   Will watch your condition.   Will get help right away if you are not doing well or get worse.  Document Released: 06/18/2005 Document Revised: 02/28/2011 Document Reviewed: 10/30/2006 [facility] Patient Information 2012 [place], [place]. 24 hours of clear liquids with gatorade/pedialyte and ginger ale Then BRAT diet as tolerated Try Preparation H for the rectal irritation if no response consider Anusol HC supppositories twice daily

## 2011-05-31 NOTE — Assessment & Plan Note (Addendum)
Abrupt onset of symptoms about 9 hours ago with nausea, vomitting and diarrhea. 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 We will check labs including CBC, sed rate, UA and H Pylori. She has a family history of family members with H Pylori although her personal testing has been negative in the past. She is sent home with promethazine to use and push fluids and rest. Taken out of work for today. Seek further care if symptoms worsen

## 2011-06-01 ENCOUNTER — Telehealth: Payer: Self-pay | Admitting: Family Medicine

## 2011-06-01 NOTE — Telephone Encounter (Signed)
Patient requesting work excuse 05/31/11 & 06/01/11. Please fax to [phone]. Please let patient know if any results are avail from [facility]

## 2011-06-01 NOTE — Telephone Encounter (Signed)
Work note is fine for 11/29 and 06/01/11

## 2011-06-01 NOTE — Telephone Encounter (Signed)
Please advise 

## 2011-06-01 NOTE — Telephone Encounter (Signed)
I left a detailed message on patients voicemail about lab results. Please just advise the work note?

## 2011-06-01 NOTE — Telephone Encounter (Signed)
Letter faxed and patient informed.

## 2011-06-18 ENCOUNTER — Ambulatory Visit: Payer: Managed Care, Other (non HMO) | Admitting: Internal Medicine

## 2011-07-12 ENCOUNTER — Other Ambulatory Visit (HOSPITAL_COMMUNITY)
Admission: RE | Admit: 2011-07-12 | Discharge: 2011-07-12 | Disposition: A | Discharge status: Home / Self Care | Payer: Managed Care, Other (non HMO) | Source: Ambulatory Visit | Attending: Family Medicine | Admitting: Family Medicine

## 2011-07-12 ENCOUNTER — Encounter: Payer: Self-pay | Admitting: Family Medicine

## 2011-07-12 ENCOUNTER — Ambulatory Visit (INDEPENDENT_AMBULATORY_CARE_PROVIDER_SITE_OTHER): Payer: Managed Care, Other (non HMO) | Admitting: Family Medicine

## 2011-07-12 DIAGNOSIS — H469 Unspecified optic neuritis: Secondary | ICD-10-CM

## 2011-07-12 DIAGNOSIS — S61209A Unspecified open wound of unspecified finger without damage to nail, initial encounter: Secondary | ICD-10-CM

## 2011-07-12 DIAGNOSIS — D229 Melanocytic nevi, unspecified: Secondary | ICD-10-CM

## 2011-07-12 DIAGNOSIS — K219 Gastro-esophageal reflux disease without esophagitis: Secondary | ICD-10-CM

## 2011-07-12 DIAGNOSIS — K625 Hemorrhage of anus and rectum: Secondary | ICD-10-CM

## 2011-07-12 DIAGNOSIS — D239 Other benign neoplasm of skin, unspecified: Secondary | ICD-10-CM

## 2011-07-12 DIAGNOSIS — R51 Headache: Secondary | ICD-10-CM

## 2011-07-12 DIAGNOSIS — Z0000 Encounter for general adult medical examination without abnormal findings: Secondary | ICD-10-CM

## 2011-07-12 DIAGNOSIS — O99280 Endocrine, nutritional and metabolic diseases complicating pregnancy, unspecified trimester: Secondary | ICD-10-CM

## 2011-07-12 DIAGNOSIS — IMO0002 Reserved for concepts with insufficient information to code with codable children: Secondary | ICD-10-CM

## 2011-07-12 DIAGNOSIS — T7840XA Allergy, unspecified, initial encounter: Secondary | ICD-10-CM

## 2011-07-12 DIAGNOSIS — Z124 Encounter for screening for malignant neoplasm of cervix: Secondary | ICD-10-CM

## 2011-07-12 DIAGNOSIS — Z01419 Encounter for gynecological examination (general) (routine) without abnormal findings: Secondary | ICD-10-CM | POA: Insufficient documentation

## 2011-07-12 HISTORY — DX: Unspecified optic neuritis: H46.9

## 2011-07-12 MED ORDER — HYDROCORTISONE ACETATE 25 MG RE SUPP: 25.0000 mg | Freq: Two times a day (BID) | RECTAL | Status: AC

## 2011-07-12 MED ORDER — DIPHENHYDRAMINE HCL 25 MG PO TABS: 25.0000 mg | ORAL_TABLET | Freq: Four times a day (QID) | ORAL | Status: DC | PRN

## 2011-07-12 MED ORDER — FLUTICASONE PROPIONATE 50 MCG/ACT NA SUSP: 2.0000 | Freq: Every day | NASAL | Status: DC

## 2011-07-12 MED ORDER — LORATADINE 10 MG PO TABS: 10.0000 mg | ORAL_TABLET | Freq: Every day | ORAL | Status: DC

## 2011-07-12 NOTE — Patient Instructions (Signed)
Rectal Bleeding     Rectal bleeding is when blood passes out of the anus. It is usually a sign that something is wrong. It may not be serious, but it should always be evaluated. Rectal bleeding may present as bright red blood or extremely dark stools. The color may range from dark red or maroon to black (like tar). It is important that the cause of rectal bleeding be identified so treatment can be started and the problem corrected.  CAUSES   · Hemorrhoids. These are enlarged (dilated) blood vessels or veins in the anal or rectal area.   · Fistulas. These are abnormal, burrowing channels that usually run from inside the rectum to the skin around the anus. They can bleed.   · Anal fissures. This is a tear in the tissue of the anus. Bleeding occurs with bowel movements.   · Diverticulosis. This is a condition in which pockets or sacs project from the bowel wall. Occasionally, the sacs can bleed.   · Diverticulitis. This is an infection involving diverticulosis of the colon.   · Proctitis and colitis. These are conditions in which the rectum, colon, or both, can become inflamed and pitted (ulcerated).   · Polyps and cancer. Polyps are non-cancerous (benign) growths in the colon that may bleed. Certain types of polyps turn into cancer.   · Protrusion of the rectum. Part of the rectum can project from the anus and bleed.   · Certain medicines.   · Intestinal infections.   · Blood vessel abnormalities.   HOME CARE INSTRUCTIONS  · Eat a high-fiber diet to keep your stool soft.   · Limit activity.   · Drink enough fluids to keep your urine clear or pale yellow.   · Warm baths may be useful to soothe rectal pain.   · Follow up with your caregiver as directed.   SEEK IMMEDIATE MEDICAL CARE IF:  · You develop increased bleeding.   · You have black or dark red stools.   · You vomit blood or material that looks like coffee grounds.   · You have abdominal pain or tenderness.   · You have a fever.   · You feel weak, nauseous, or  you faint.   · You have severe rectal pain or you are unable to have a bowel movement.   MAKE SURE YOU:  · Understand these instructions.   · Will watch your condition.   · Will get help right away if you are not doing well or get worse.   Document Released: 12/08/2001 Document Revised: 02/28/2011 Document Reviewed: 12/03/2010  [employer]® Patient Information ©2012 [employer].

## 2011-07-13 ENCOUNTER — Encounter: Payer: Self-pay | Admitting: Family Medicine

## 2011-07-13 DIAGNOSIS — T7840XA Allergy, unspecified, initial encounter: Secondary | ICD-10-CM | POA: Insufficient documentation

## 2011-07-13 DIAGNOSIS — J309 Allergic rhinitis, unspecified: Secondary | ICD-10-CM

## 2011-07-13 DIAGNOSIS — G510 Bell's palsy: Secondary | ICD-10-CM

## 2011-07-13 DIAGNOSIS — G43909 Migraine, unspecified, not intractable, without status migrainosus: Secondary | ICD-10-CM

## 2011-07-13 HISTORY — DX: Bell's palsy: G51.0

## 2011-07-13 HISTORY — DX: Allergic rhinitis, unspecified: J30.9

## 2011-07-13 HISTORY — DX: Migraine, unspecified, not intractable, without status migrainosus: G43.909

## 2011-07-13 NOTE — Assessment & Plan Note (Signed)
Using Excedrine Migraine as needed. Encouraged good hydration and sleep

## 2011-07-13 NOTE — Assessment & Plan Note (Signed)
Previously referred to GI for persistent and worsening reflux but did not keep the appt. Agrees to referral again due to new symptoms. Hemeoccult negative today but is given Anusol HC suppositories to use if symptoms return while she awaits her appt

## 2011-07-13 NOTE — Assessment & Plan Note (Signed)
Try Claritin in am and Benadryl qhs, given rx for Fluticasone to use prn.

## 2011-07-13 NOTE — Assessment & Plan Note (Signed)
She has had it since her teens but she believes it is enlarging and now has two colors, will refer to dermatology for further evaluation

## 2011-07-13 NOTE — Assessment & Plan Note (Signed)
Pap smear taken today 

## 2011-07-13 NOTE — Progress Notes (Signed)
Patient ID: Danielle Sims, female   DOB: 12-23-1980, 31 y.o.   MRN: [id number] Danielle Sims [id number] 24-Apr-1981 07/13/2011      Progress Note-Follow Up  Subjective  Chief Complaint  Chief Complaint  Patient presents with  . Annual Exam    no problems  . Gynecologic Exam    no problems  . Hemorrhoids    bleeding    HPI  Patient is a 31 year old Hispanic female who is in today for annual GYN exam. She is denying any significant GYN complaints. No discharge, lesions, breast complaints but she is struggling with a few concerns. She has worsening allergies at the present time. Is using Benadryl at bedtime with some effect but her symptoms returned the next day. She has congestion, itching, rhinorrhea. No fevers, chills. She has had some headaches recently which have been responsive to Excedrin Migraine and are tolerable. No chest pains, palpitations, shortness of breath, GU complaints. She does continue to struggle with some reflux symptoms and unfortunately also is struggling with some low-grade constipation. She is to strain every one to 2 days and recently has noted some blood on the stool intermittently over the past 2 weeks. It occurs after she's had to strain. His most recent episode did have some blood dripping after the bowel movement occurred. She has recently been referred to gastroenterology for her persistent worsening reflux symptoms but was unable to make the appointment. Now agrees to proceed. She has a mole in her left ear she said since he thinks roughly 17 but not recently has begun to enlarge and she would like to have evaluated.  Past Medical History  Diagnosis Date  . Migraines   . Bronchitis, acute 01/18/2011  . Gastroenteritis 05/31/2011  . Suprapubic pain 05/31/2011  . Cervical cancer screening 07/12/2011  . Rectal bleeding 07/13/2011  . Headache 07/13/2011  . Allergic state 07/13/2011    Past Surgical History  Procedure Date  . Rhinoplasty     at 31 yo  . Cheek bones  corrected     at 31 yo  . Breast enhancement surgery 2008  . Appendectomy 2006    Enlarged  . Upper gastrointestinal endoscopy     Family History  Problem Relation Age of Onset  . Stroke Mother   . Heart disease Mother   . Hyperlipidemia Mother   . Rheum arthritis Mother   . Fibroids Mother     Uterine  . Fibrocystic breast disease Mother   . Hyperlipidemia Maternal Grandmother   . Osteoarthritis Maternal Grandmother   . Glaucoma Maternal Grandmother   . Cancer Maternal Grandfather     Skin, prostate, throat, stomach    History   Social History  . Marital Status: Married    Spouse Name: N/A    Number of Children: N/A  . Years of Education: N/A   Occupational History  . Not on file.   Social History Main Topics  . Smoking status: Never Smoker   . Smokeless tobacco: Never Used  . Alcohol Use: 0.0 oz/week    0 drink(s) per week     ocassional  . Drug Use: No  . Sexually Active: Yes -- Female partner(s)   Other Topics Concern  . Not on file   Social History Narrative  . No narrative on file    Current Outpatient Prescriptions on File Prior to Visit  Medication Sig Dispense Refill  . aspirin-acetaminophen-caffeine (EXCEDRIN MIGRAINE) 250-250-65 MG per tablet Take 1 tablet by mouth every 6 (six) hours as  needed.        Marland Kitchen omeprazole (PRILOSEC) 20 MG capsule Take 20 mg by mouth daily.          No Known Allergies  Review of Systems  Review of Systems  Constitutional: Negative for fever and malaise/fatigue.  HENT: Positive for congestion. Negative for sore throat.   Eyes: Negative for discharge.  Respiratory: Positive for sputum production. Negative for cough, shortness of breath and wheezing.   Cardiovascular: Negative for chest pain, palpitations and leg swelling.  Gastrointestinal: Positive for heartburn, constipation and blood in stool. Negative for nausea, abdominal pain, diarrhea and melena.  Genitourinary: Negative for dysuria.  Musculoskeletal:  Negative for falls.  Skin: Positive for itching. Negative for rash.  Neurological: Negative for loss of consciousness and headaches.  Endo/Heme/Allergies: Positive for environmental allergies. Negative for polydipsia.  Psychiatric/Behavioral: Negative for depression and suicidal ideas. The patient is not nervous/anxious and does not have insomnia.     Objective  BP 101/66  Pulse 70  Temp(Src) 99.2 F (37.3 C) (Temporal)  Ht 4' 11.25" (1.505 m)  Wt 112 lb (50.803 kg)  BMI 22.43 kg/m2  SpO2 99%  LMP 06/26/2011  Physical Exam  Physical Exam  Constitutional: She is oriented to person, place, and time and well-developed, well-nourished, and in no distress. No distress.  HENT:  Head: Normocephalic and atraumatic.  Right Ear: External ear normal.  Left Ear: External ear normal.  Nose: Nose normal.  Mouth/Throat: Oropharynx is clear and moist. No oropharyngeal exudate.  Eyes: Conjunctivae and EOM are normal. Pupils are equal, round, and reactive to light. Right eye exhibits no discharge. Left eye exhibits no discharge. No scleral icterus.  Neck: Neck supple. No thyromegaly present.  Cardiovascular: Normal rate, regular rhythm and normal heart sounds.   No murmur heard. Pulmonary/Chest: Effort normal and breath sounds normal. She has no wheezes.  Abdominal: Soft. Bowel sounds are normal. She exhibits no distension and no mass. There is no tenderness.  Genitourinary: Vagina normal, uterus normal, cervix normal, right adnexa normal and left adnexa normal. Guaiac negative stool. No vaginal discharge found.       Breast exam normal, no lesions, skin changes, discharge.  Musculoskeletal: She exhibits no edema.  Lymphadenopathy:    She has no cervical adenopathy.  Neurological: She is alert and oriented to person, place, and time.  Skin: Skin is warm and dry. No rash noted. She is not diaphoretic.       Raised, dark mole on tragus of left ear. Roughly 8 mm and 2 shades of brown    Psychiatric: Memory, affect and judgment normal.    No results found for this basename: TSH   Lab Results  Component Value Date   WBC 8.2 05/31/2011   HGB 13.3 05/31/2011   HCT 39.8 05/31/2011   MCV 91.4 05/31/2011   PLT 361.0 05/31/2011   Lab Results  Component Value Date   CREATININE 0.6 05/31/2011   BUN 14 05/31/2011   NA 140 05/31/2011   K 4.2 05/31/2011   CL 107 05/31/2011   CO2 27 05/31/2011   Lab Results  Component Value Date   ALT 20 05/31/2011   AST 20 05/31/2011   ALKPHOS 62 05/31/2011   BILITOT 0.5 05/31/2011      Assessment & Plan  Cervical cancer screening Pap smear taken today  Mole (skin) She has had it since her teens but she believes it is enlarging and now has two colors, will refer to dermatology for further evaluation  Rectal bleeding Previously referred to GI for persistent and worsening reflux but did not keep the appt. Agrees to referral again due to new symptoms. Hemeoccult negative today but is given Anusol HC suppositories to use if symptoms return while she awaits her appt  Headache Using Excedrine Migraine as needed. Encouraged good hydration and sleep  Allergic state Try Claritin in am and Benadryl qhs, given rx for Fluticasone to use prn.

## 2011-07-16 ENCOUNTER — Encounter: Payer: Self-pay | Admitting: Family Medicine

## 2011-07-16 ENCOUNTER — Ambulatory Visit (INDEPENDENT_AMBULATORY_CARE_PROVIDER_SITE_OTHER): Payer: Managed Care, Other (non HMO) | Admitting: Family Medicine

## 2011-07-16 VITALS — BP 94/68 | HR 80 | Temp 98.2°F | Ht 59.25 in | Wt 112.4 lb

## 2011-07-16 DIAGNOSIS — J209 Acute bronchitis, unspecified: Secondary | ICD-10-CM

## 2011-07-16 DIAGNOSIS — J40 Bronchitis, not specified as acute or chronic: Secondary | ICD-10-CM

## 2011-07-16 MED ORDER — AZITHROMYCIN 250 MG PO TABS: ORAL_TABLET | ORAL | Status: AC

## 2011-07-16 MED ORDER — PREDNISONE 20 MG PO TABS: 20.0000 mg | ORAL_TABLET | Freq: Two times a day (BID) | ORAL | Status: DC

## 2011-07-16 NOTE — Assessment & Plan Note (Signed)
With pharyngitis, push fluids increase rest, and started on Mucinex and antibiotics, kept on bed rest for a t least the next 24 hours. Call if no improvement

## 2011-07-16 NOTE — Patient Instructions (Signed)

## 2011-07-16 NOTE — Progress Notes (Signed)
Patient ID: Danielle Sims, female   DOB: [birth year], 31 y.o.   MRN: [id number] Sharene Krikorian [id number] February 26, 1981 07/16/2011      Progress Note-Follow Up  Subjective  Chief Complaint  Chief Complaint  Patient presents with  . Sore Throat    laryngitis? X 3 days    HPI  Patient is a 31 year old Hispanic female returned today with 3 day history of pharyngitis with throat pain, congestion, malaise, chills, cough and some shortness of breath. No GI symptoms. No palpitations or chest pain. No ear pain. She does have mild headache and anorexia.  Past Medical History  Diagnosis Date  . Migraines   . Bronchitis, acute 01/18/2011  . Gastroenteritis 05/31/2011  . Suprapubic pain 05/31/2011  . Cervical cancer screening 07/12/2011  . Rectal bleeding 07/13/2011  . Headache 07/13/2011  . Allergic state 07/13/2011  . Bronchitis 07/16/2011    Past Surgical History  Procedure Date  . Rhinoplasty     at 31 yo  . Cheek bones corrected     at 31 yo  . Breast enhancement surgery 2008  . Appendectomy 2006    Enlarged  . Upper gastrointestinal endoscopy     Family History  Problem Relation Age of Onset  . Stroke Mother   . Heart disease Mother   . Hyperlipidemia Mother   . Rheum arthritis Mother   . Fibroids Mother     Uterine  . Fibrocystic breast disease Mother   . Hyperlipidemia Maternal Grandmother   . Osteoarthritis Maternal Grandmother   . Glaucoma Maternal Grandmother   . Cancer Maternal Grandfather     Skin, prostate, throat, stomach    History   Social History  . Marital Status: Married    Spouse Name: N/A    Number of Children: N/A  . Years of Education: N/A   Occupational History  . Not on file.   Social History Main Topics  . Smoking status: Never Smoker   . Smokeless tobacco: Never Used  . Alcohol Use: 0.0 oz/week    0 drink(s) per week     ocassional  . Drug Use: No  . Sexually Active: Yes -- Female partner(s)   Other Topics Concern  . Not on file   Social  History Narrative  . No narrative on file    Current Outpatient Prescriptions on File Prior to Visit  Medication Sig Dispense Refill  . aspirin-acetaminophen-caffeine (EXCEDRIN MIGRAINE) 250-250-65 MG per tablet Take 1 tablet by mouth every 6 (six) hours as needed.        . diphenhydrAMINE (BENADRYL) 25 MG tablet Take 1 tablet (25 mg total) by mouth every 6 (six) hours as needed for itching.  30 tablet  0  . fluticasone (FLONASE) 50 MCG/ACT nasal spray Place 2 sprays into the nose daily.  16 g  6  . hydrocortisone (ANUSOL-HC) 25 MG suppository Place 1 suppository (25 mg total) rectally 2 (two) times daily.  12 suppository  1  . loratadine (CLARITIN) 10 MG tablet Take 1 tablet (10 mg total) by mouth daily.  30 tablet  0  . omeprazole (PRILOSEC) 20 MG capsule Take 20 mg by mouth daily.        . OXcarbazepine (TRILEPTAL) 150 MG tablet Take 1 tablet by mouth Daily.        No Known Allergies  Review of Systems  Review of Systems  Constitutional: Positive for chills and malaise/fatigue. Negative for fever.  HENT: Positive for congestion and sore throat.   Eyes: Negative for  discharge.  Respiratory: Positive for cough. Negative for shortness of breath.   Cardiovascular: Negative for chest pain, palpitations and leg swelling.  Gastrointestinal: Negative for nausea, abdominal pain and diarrhea.  Genitourinary: Negative for dysuria.  Musculoskeletal: Negative for falls.  Skin: Negative for rash.  Neurological: Negative for loss of consciousness and headaches.  Endo/Heme/Allergies: Negative for polydipsia.  Psychiatric/Behavioral: Negative for depression and suicidal ideas. The patient is not nervous/anxious and does not have insomnia.     Objective  BP 94/68  Pulse 80  Temp(Src) 98.2 F (36.8 C) (Temporal)  Ht 4' 11.25" (1.505 m)  Wt 112 lb 6.4 oz (50.984 kg)  BMI 22.51 kg/m2  SpO2 100%  LMP 06/26/2011  Physical Exam  Physical Exam  Constitutional: She is oriented to person,  place, and time and well-developed, well-nourished, and in no distress. No distress.  HENT:  Head: Normocephalic and atraumatic.       Dry mucus membranes, oropharynx erythematous  Eyes: Conjunctivae are normal.  Neck: Neck supple. No thyromegaly present.  Cardiovascular: Normal rate, regular rhythm and normal heart sounds.   No murmur heard. Pulmonary/Chest: Effort normal. She has no wheezes.       LLL rhonchi  Abdominal: She exhibits no distension and no mass.  Musculoskeletal: She exhibits no edema.  Lymphadenopathy:    She has no cervical adenopathy.  Neurological: She is alert and oriented to person, place, and time.  Skin: Skin is warm and dry. No rash noted. She is not diaphoretic.  Psychiatric: Memory, affect and judgment normal.    No results found for this basename: TSH   Lab Results  Component Value Date   WBC 8.2 05/31/2011   HGB 13.3 05/31/2011   HCT 39.8 05/31/2011   MCV 91.4 05/31/2011   PLT 361.0 05/31/2011   Lab Results  Component Value Date   CREATININE 0.6 05/31/2011   BUN 14 05/31/2011   NA 140 05/31/2011   K 4.2 05/31/2011   CL 107 05/31/2011   CO2 27 05/31/2011   Lab Results  Component Value Date   ALT 20 05/31/2011   AST 20 05/31/2011   ALKPHOS 62 05/31/2011   BILITOT 0.5 05/31/2011      Assessment & Plan  Bronchitis, acute With pharyngitis, push fluids increase rest, and started on Mucinex and antibiotics, kept on bed rest for a t least the next 24 hours. Call if no improvement

## 2011-07-17 ENCOUNTER — Encounter (HOSPITAL_COMMUNITY): Payer: Self-pay | Admitting: *Deleted

## 2011-07-17 ENCOUNTER — Emergency Department (HOSPITAL_COMMUNITY)
Admission: EM | Admit: 2011-07-17 | Discharge: 2011-07-17 | Disposition: A | Discharge status: Home / Self Care | Payer: Managed Care, Other (non HMO) | Attending: Emergency Medicine | Admitting: Emergency Medicine

## 2011-07-17 ENCOUNTER — Emergency Department (HOSPITAL_COMMUNITY): Payer: Managed Care, Other (non HMO)

## 2011-07-17 ENCOUNTER — Telehealth: Payer: Self-pay | Admitting: Family Medicine

## 2011-07-17 DIAGNOSIS — Z79899 Other long term (current) drug therapy: Secondary | ICD-10-CM | POA: Insufficient documentation

## 2011-07-17 DIAGNOSIS — J3489 Other specified disorders of nose and nasal sinuses: Secondary | ICD-10-CM | POA: Insufficient documentation

## 2011-07-17 DIAGNOSIS — R059 Cough, unspecified: Secondary | ICD-10-CM | POA: Insufficient documentation

## 2011-07-17 DIAGNOSIS — R6883 Chills (without fever): Secondary | ICD-10-CM | POA: Insufficient documentation

## 2011-07-17 DIAGNOSIS — R062 Wheezing: Secondary | ICD-10-CM | POA: Insufficient documentation

## 2011-07-17 DIAGNOSIS — R070 Pain in throat: Secondary | ICD-10-CM | POA: Insufficient documentation

## 2011-07-17 DIAGNOSIS — R05 Cough: Secondary | ICD-10-CM | POA: Insufficient documentation

## 2011-07-17 MED ORDER — ALBUTEROL SULFATE HFA 108 (90 BASE) MCG/ACT IN AERS
2.0000 | INHALATION_SPRAY | RESPIRATORY_TRACT | Status: DC | PRN
  Administered 2011-07-17: 2 via RESPIRATORY_TRACT
  Filled 2011-07-17 (×2): qty 6.7

## 2011-07-17 NOTE — Telephone Encounter (Signed)
Patient's husband called & said patient is spitting up blood, Christy advised that patient should be taken to the hospital. Patient's husband is agreeable

## 2011-07-17 NOTE — ED Notes (Signed)
Discharge instructions reviewed; verbalizes understanding.  No questions asked; no further c/o's voiced.  Ambulatory to lobby.  NAD noted. 

## 2011-07-17 NOTE — ED Notes (Signed)
Pt is here with cough and headache.  Pt was told that she had sinus infection and bronchitis.  Pt was started on prednisone and azithromycin and today started coughing up blood.

## 2011-07-17 NOTE — ED Provider Notes (Signed)
History     CSN: [id number]  Arrival date & time 07/17/11  1205   First MD Initiated Contact with Patient 07/17/11 1404      Chief Complaint  Patient presents with  . Sinusitis     Patient is a 31 y.o. female presenting with cough. The history is provided by the patient and the spouse.  Cough This is a new problem. The current episode started more than 2 days ago. The problem occurs hourly. The problem has been gradually worsening. The cough is productive of bloody sputum. Associated symptoms include chills, rhinorrhea, sore throat and wheezing. Pertinent negatives include no chest pain and no shortness of breath.  Pt with recent cough/congestion/sore throat and reports "lost my voice" Seen by PCP, started on zpack This morning, has been coughing more and she had small amt of blood mixed in No epistaxis No hematemesis Reports mostly sputum with blood mixed, has not coughed up large quantities No CP/SOB reported She is a nonsmoker She called her PCP who instructed her to go to the ED  Past Medical History  Diagnosis Date  . Migraines   . Bronchitis, acute 01/18/2011  . Gastroenteritis 05/31/2011  . Suprapubic pain 05/31/2011  . Cervical cancer screening 07/12/2011  . Rectal bleeding 07/13/2011  . Headache 07/13/2011  . Allergic state 07/13/2011  . Bronchitis 07/16/2011    Past Surgical History  Procedure Date  . Rhinoplasty     at 31 yo  . Cheek bones corrected     at 31 yo  . Breast enhancement surgery 2008  . Appendectomy 2006    Enlarged  . Upper gastrointestinal endoscopy     Family History  Problem Relation Age of Onset  . Stroke Mother   . Heart disease Mother   . Hyperlipidemia Mother   . Rheum arthritis Mother   . Fibroids Mother     Uterine  . Fibrocystic breast disease Mother   . Hyperlipidemia Maternal Grandmother   . Osteoarthritis Maternal Grandmother   . Glaucoma Maternal Grandmother   . Cancer Maternal Grandfather     Skin, prostate, throat,  stomach    History  Substance Use Topics  . Smoking status: Never Smoker   . Smokeless tobacco: Never Used  . Alcohol Use: 0.0 oz/week    0 drink(s) per week     ocassional    OB History    Grav Para Term Preterm Abortions TAB SAB Ect Mult Living                  Review of Systems  Constitutional: Positive for chills.  HENT: Positive for sore throat and rhinorrhea.   Respiratory: Positive for cough and wheezing. Negative for shortness of breath.   Cardiovascular: Negative for chest pain.    Allergies  Review of patient's allergies indicates no known allergies.  Home Medications   Current Outpatient Rx  Name Route Sig Dispense Refill  . ASPIRIN-ACETAMINOPHEN-CAFFEINE 250-250-65 MG PO TABS Oral Take 1 tablet by mouth every 6 (six) hours as needed.      . AZITHROMYCIN 250 MG PO TABS  2 tabs po once then 1 tab po daily x 4 days 6 tablet 0  . DIPHENHYDRAMINE HCL 25 MG PO TABS Oral Take 1 tablet (25 mg total) by mouth every 6 (six) hours as needed for itching. 30 tablet 0  . ESOMEPRAZOLE MAGNESIUM 40 MG PO CPDR Oral Take 40 mg by mouth daily before breakfast.    . FLUTICASONE PROPIONATE 50 MCG/ACT NA  SUSP Nasal Place 2 sprays into the nose daily. 16 g 6  . HYDROCORTISONE ACETATE 25 MG RE SUPP Rectal Place 1 suppository (25 mg total) rectally 2 (two) times daily. 12 suppository 1  . OXCARBAZEPINE 150 MG PO TABS Oral Take 1 tablet by mouth Daily.    Marland Kitchen PREDNISONE 20 MG PO TABS Oral Take 1 tablet (20 mg total) by mouth 2 (two) times daily. 10 tablet 0  . LORATADINE 10 MG PO TABS Oral Take 1 tablet (10 mg total) by mouth daily. 30 tablet 0    BP 145/86  Pulse 80  Temp(Src) 98.5 F (36.9 C) (Oral)  Resp 20  SpO2 98%  LMP 06/26/2011  Physical Exam CONSTITUTIONAL: Well developed/well nourished HEAD AND FACE: Normocephalic/atraumatic EYES: EOMI/PERRL ENMT: Mucous membranes moist, no evidence of epistaxis Uvula midline, pharynx normal No stridor NECK: supple no meningeal  signs SPINE:entire spine nontender CV: S1/S2 noted, no murmurs/rubs/gallops noted LUNGS: scattered wheeze but no rales, good air entry, no distress ABDOMEN: soft, nontender, no rebound or guarding GU:no cva tenderness NEURO: Pt is awake/alert, moves all extremitiesx4 EXTREMITIES: pulses normal, full ROM SKIN: warm, color normal PSYCH: no abnormalities of mood noted  ED Course  Procedures (including critical care time)  Labs Reviewed - No data to display Dg Chest 2 View  07/17/2011  *RADIOLOGY REPORT*  Clinical Data: Cough, hemoptysis  CHEST - 2 VIEW  Comparison: None  Findings: Cardiomediastinal silhouette is unremarkable.  No acute infiltrate or pleural effusion.  No pulmonary edema.  Bony thorax is unremarkable.  IMPRESSION: No active disease.  Original Report Authenticated By: Natasha Mead, M.D.     1. Cough    Pt well appearing, cxr negative, already on abx, does not appear to be massive hemoptysis She is taking PO fluids without difficulty Vitals appropriate Doubt PE at this tme The patient appears reasonably screened and/or stabilized for discharge and I doubt any other medical condition or other [facility] requiring further screening, evaluation, or treatment in the ED at this time prior to discharge.    MDM  Nursing notes reviewed and considered in documentation xrays reviewed and considered Previous records reviewed and considered         Joya Gaskins, MD 07/17/11 1655

## 2011-07-17 NOTE — Telephone Encounter (Signed)
FYI

## 2011-07-19 ENCOUNTER — Other Ambulatory Visit (INDEPENDENT_AMBULATORY_CARE_PROVIDER_SITE_OTHER): Payer: Managed Care, Other (non HMO)

## 2011-07-19 ENCOUNTER — Telehealth: Payer: Self-pay | Admitting: Family Medicine

## 2011-07-19 DIAGNOSIS — Z0000 Encounter for general adult medical examination without abnormal findings: Secondary | ICD-10-CM

## 2011-07-19 LAB — RENAL FUNCTION PANEL
Albumin: 4.1 g/dL (ref 3.5–5.2)
CO2: 26 mEq/L (ref 19–32)
Calcium: 8.9 mg/dL (ref 8.4–10.5)
Creatinine, Ser: 0.6 mg/dL (ref 0.4–1.2)
Glucose, Bld: 78 mg/dL (ref 70–99)

## 2011-07-19 LAB — CBC
HCT: 39.4 % (ref 36.0–46.0)
Hemoglobin: 13.1 g/dL (ref 12.0–15.0)
MCHC: 33.3 g/dL (ref 30.0–36.0)
Platelets: 336 10*3/uL (ref 150.0–400.0)
RDW: 13.6 % (ref 11.5–14.6)

## 2011-07-19 LAB — HEPATIC FUNCTION PANEL: Albumin: 4.1 g/dL (ref 3.5–5.2)

## 2011-07-19 LAB — LIPID PANEL
Cholesterol: 169 mg/dL (ref 0–200)
HDL: 52.4 mg/dL (ref >39.00)
Triglycerides: 123 mg/dL (ref 0.0–149.0)
VLDL: 24.6 mg/dL (ref 0.0–40.0)

## 2011-07-19 NOTE — Telephone Encounter (Signed)
Please mail last lab results to patient. 

## 2011-07-19 NOTE — Telephone Encounter (Signed)
Left a message for patient to return my call. 

## 2011-07-26 ENCOUNTER — Encounter: Payer: Managed Care, Other (non HMO) | Admitting: Family Medicine

## 2011-07-30 NOTE — Telephone Encounter (Signed)
Sent to patient.

## 2011-08-27 ENCOUNTER — Encounter: Payer: Self-pay | Admitting: Family Medicine

## 2011-08-27 ENCOUNTER — Ambulatory Visit (HOSPITAL_BASED_OUTPATIENT_CLINIC_OR_DEPARTMENT_OTHER)
Admission: RE | Admit: 2011-08-27 | Discharge: 2011-08-27 | Disposition: A | Discharge status: Home / Self Care | Payer: Managed Care, Other (non HMO) | Source: Ambulatory Visit | Attending: Family Medicine | Admitting: Family Medicine

## 2011-08-27 ENCOUNTER — Ambulatory Visit (INDEPENDENT_AMBULATORY_CARE_PROVIDER_SITE_OTHER): Payer: Managed Care, Other (non HMO) | Admitting: Family Medicine

## 2011-08-27 DIAGNOSIS — R1012 Left upper quadrant pain: Secondary | ICD-10-CM | POA: Insufficient documentation

## 2011-08-27 DIAGNOSIS — K219 Gastro-esophageal reflux disease without esophagitis: Secondary | ICD-10-CM

## 2011-08-27 LAB — POCT URINALYSIS DIPSTICK
Ketones, UA: NEGATIVE
Leukocytes, UA: NEGATIVE
Nitrite, UA: NEGATIVE
Protein, UA: NEGATIVE
Urobilinogen, UA: 0.2
pH, UA: 5.5

## 2011-08-27 LAB — CBC
Hemoglobin: 13.3 g/dL (ref 12.0–15.0)
MCHC: 32.8 g/dL (ref 30.0–36.0)
MCV: 91.5 fl (ref 78.0–100.0)
RDW: 13.7 % (ref 11.5–14.6)

## 2011-08-27 LAB — HEPATIC FUNCTION PANEL
Albumin: 4.2 g/dL (ref 3.5–5.2)
Alkaline Phosphatase: 62 U/L (ref 39–117)
Total Protein: 7.2 g/dL (ref 6.0–8.3)

## 2011-08-27 LAB — RENAL FUNCTION PANEL
BUN: 14 mg/dL (ref 6–23)
Chloride: 108 mEq/L (ref 96–112)
GFR: 127.12 mL/min (ref >60.00)
Glucose, Bld: 91 mg/dL (ref 70–99)
Phosphorus: 3 mg/dL (ref 2.3–4.6)
Potassium: 4.2 mEq/L (ref 3.5–5.1)

## 2011-08-27 LAB — H. PYLORI ANTIBODY, IGG: H Pylori IgG: NEGATIVE

## 2011-08-27 LAB — SEDIMENTATION RATE: Sed Rate: 12 mm/hr (ref 0–22)

## 2011-08-27 MED ORDER — OMEPRAZOLE 20 MG PO CPDR: 20.0000 mg | DELAYED_RELEASE_CAPSULE | Freq: Every day | ORAL | Status: DC

## 2011-08-27 NOTE — Patient Instructions (Signed)

## 2011-08-27 NOTE — Assessment & Plan Note (Addendum)
Has not been taking her omeprazole regularly. She is encouraged to take a daily at the present time and we will check an H. pylori. She will notify us if symptoms are persistent or do not resolve. Complete ultrasound of the abdomen today was unremarkable.

## 2011-08-27 NOTE — Progress Notes (Signed)
Patient ID: Danielle Sims, female   DOB: Dec 28, 1980, 31 y.o.   MRN: [id number] Danielle Sims [id number] 1981-04-17 08/27/2011      Progress Note-Follow Up  Subjective  Chief Complaint  Chief Complaint  Patient presents with  . pain under ribs    X 3 weeks off and on feels like pain is coming from spleen?    HPI  Patient is a 31 year old female in today with a roughly three-week history of upper quadrant pain. She describes it as intermittent but more on than off. Low level at most times, she rates it at 1-4/10. In the last 24 hour she's had 2 episodes of the pain increasing to 7-8/10 and her pain is now back to a 4. The first time was when she was palpating the area and the second time was when she was lying on the area. She reports also some low-grade nausea but no vomiting, diarrhea, constipation. No blood or tarry stool. She's noted several days of decreased appetite, malaise, fatigue and some anorexia as well.  Past Medical History  Diagnosis Date  . Migraines   . Bronchitis, acute 01/18/2011  . Gastroenteritis 05/31/2011  . Suprapubic pain 05/31/2011  . Cervical cancer screening 07/12/2011  . Rectal bleeding 07/13/2011  . Headache 07/13/2011  . Allergic state 07/13/2011  . Bronchitis 07/16/2011  . LUQ pain 08/27/2011    Past Surgical History  Procedure Date  . Rhinoplasty     at 31 yo  . Cheek bones corrected     at 31 yo  . Breast enhancement surgery 2008  . Appendectomy 2006    Enlarged  . Upper gastrointestinal endoscopy     Family History  Problem Relation Age of Onset  . Stroke Mother   . Heart disease Mother   . Hyperlipidemia Mother   . Rheum arthritis Mother   . Fibroids Mother     Uterine  . Fibrocystic breast disease Mother   . Hyperlipidemia Maternal Grandmother   . Osteoarthritis Maternal Grandmother   . Glaucoma Maternal Grandmother   . Cancer Maternal Grandfather     Skin, prostate, throat, stomach    History   Social History  . Marital Status: Married     Spouse Name: N/A    Number of Children: N/A  . Years of Education: N/A   Occupational History  . Not on file.   Social History Main Topics  . Smoking status: Never Smoker   . Smokeless tobacco: Never Used  . Alcohol Use: 0.0 oz/week    0 drink(s) per week     ocassional  . Drug Use: No  . Sexually Active: Yes -- Female partner(s)   Other Topics Concern  . Not on file   Social History Narrative  . No narrative on file    Current Outpatient Prescriptions on File Prior to Visit  Medication Sig Dispense Refill  . aspirin-acetaminophen-caffeine (EXCEDRIN MIGRAINE) 250-250-65 MG per tablet Take 1 tablet by mouth every 6 (six) hours as needed.        . loratadine (CLARITIN) 10 MG tablet Take 1 tablet (10 mg total) by mouth daily.  30 tablet  0  . OXcarbazepine (TRILEPTAL) 150 MG tablet Take 1 tablet by mouth Daily.      . fluticasone (FLONASE) 50 MCG/ACT nasal spray Place 2 sprays into the nose daily.  16 g  6    No Known Allergies  Review of Systems  Review of Systems  Constitutional: Positive for malaise/fatigue. Negative for fever.  HENT:  Negative for congestion.   Eyes: Negative for discharge.  Respiratory: Negative for shortness of breath.   Cardiovascular: Negative for chest pain, palpitations and leg swelling.  Gastrointestinal: Positive for nausea and abdominal pain. Negative for diarrhea.  Genitourinary: Negative for dysuria.  Musculoskeletal: Negative for falls.  Skin: Negative for rash.  Neurological: Positive for dizziness. Negative for loss of consciousness and headaches.  Endo/Heme/Allergies: Negative for polydipsia.  Psychiatric/Behavioral: Negative for depression and suicidal ideas. The patient is not nervous/anxious and does not have insomnia.     Objective  BP 113/74  Pulse 69  Temp(Src) 98.4 F (36.9 C) (Temporal)  Ht 4' 11.25" (1.505 m)  Wt 111 lb 12.8 oz (50.712 kg)  BMI 22.39 kg/m2  SpO2 100%  LMP 08/08/2011  Physical Exam  Physical  Exam  Constitutional: She is oriented to person, place, and time and well-developed, well-nourished, and in no distress. No distress.  HENT:  Head: Normocephalic and atraumatic.  Eyes: Conjunctivae are normal.  Neck: Neck supple. No thyromegaly present.  Cardiovascular: Normal rate, regular rhythm and normal heart sounds.   No murmur heard. Pulmonary/Chest: Effort normal and breath sounds normal. She has no wheezes.  Abdominal: Bowel sounds are normal. She exhibits no distension and no mass. There is tenderness. There is no rebound and no guarding.       luq pain with palp  Musculoskeletal: She exhibits no edema.  Lymphadenopathy:    She has no cervical adenopathy.  Neurological: She is alert and oriented to person, place, and time.  Skin: Skin is warm and dry. No rash noted. She is not diaphoretic.  Psychiatric: Memory, affect and judgment normal.    Lab Results  Component Value Date   TSH 2.27 07/19/2011   Lab Results  Component Value Date   WBC 7.0 07/19/2011   HGB 13.1 07/19/2011   HCT 39.4 07/19/2011   MCV 91.4 07/19/2011   PLT 336.0 07/19/2011   Lab Results  Component Value Date   CREATININE 0.6 07/19/2011   BUN 12 07/19/2011   NA 138 07/19/2011   K 4.2 07/19/2011   CL 104 07/19/2011   CO2 26 07/19/2011   Lab Results  Component Value Date   ALT 20 07/19/2011   AST 18 07/19/2011   ALKPHOS 62 07/19/2011   BILITOT 0.2* 07/19/2011   Lab Results  Component Value Date   CHOL 169 07/19/2011   Lab Results  Component Value Date   HDL 52.40 07/19/2011   Lab Results  Component Value Date   LDLCALC 92 07/19/2011   Lab Results  Component Value Date   TRIG 123.0 07/19/2011   Lab Results  Component Value Date   CHOLHDL 3 07/19/2011     Assessment & Plan  GERD Has not been taking her omeprazole regularly. She is encouraged to take a daily at the present time and we will check an H. pylori. She will notify us if symptoms are persistent or do not resolve. Complete ultrasound of  the abdomen today was unremarkable.  LUQ pain Has been struggling with low-level constant abdominal pain in the left upper quadrant for roughly 3 weeks in the last 24 hours she's had 2 episodes of the pain increasing to a 7 or an 8. One time as with palpation the other times when she was lying on it she describes a sharp. In the office she says the pain struck for a 10. She does have some nausea and low-grade swelling as well. Unclear ideology but concern for H.  pylori exists, avoid spicy and fatty foods. [place] of abdomen is unremarkable

## 2011-08-27 NOTE — Assessment & Plan Note (Signed)
Has been struggling with low-level constant abdominal pain in the left upper quadrant for roughly 3 weeks in the last 24 hours she's had 2 episodes of the pain increasing to a 7 or an 8. One time as with palpation the other times when she was lying on it she describes a sharp. In the office she says the pain struck for a 10. She does have some nausea and low-grade swelling as well. Unclear ideology but concern for H. pylori exists, avoid spicy and fatty foods. [place] of abdomen is unremarkable

## 2011-09-05 ENCOUNTER — Telehealth: Payer: Self-pay | Admitting: Family Medicine

## 2011-09-05 MED ORDER — OLOPATADINE HCL 0.1 % OP SOLN: 1.0000 [drp] | Freq: Two times a day (BID) | OPHTHALMIC | Status: DC

## 2011-09-05 NOTE — Telephone Encounter (Signed)
Please advise 

## 2011-09-05 NOTE — Telephone Encounter (Signed)
RX sent and pts husband informed and states understandment

## 2011-09-05 NOTE — Telephone Encounter (Signed)
patanol eye drops is fine with me but make sure she is taking an antihistamine twice daily such as Zyrtec. Patanol 1 drop b/l eyes bid prn allergies, Disp # 1 bottle with 1 rf

## 2011-09-14 ENCOUNTER — Telehealth: Payer: Self-pay

## 2011-09-14 ENCOUNTER — Encounter: Payer: Self-pay | Admitting: Family Medicine

## 2011-09-14 ENCOUNTER — Ambulatory Visit (INDEPENDENT_AMBULATORY_CARE_PROVIDER_SITE_OTHER): Payer: Managed Care, Other (non HMO) | Admitting: Family Medicine

## 2011-09-14 VITALS — BP 122/81 | HR 67 | Temp 100.4°F | Wt 110.0 lb

## 2011-09-14 DIAGNOSIS — G510 Bell's palsy: Secondary | ICD-10-CM

## 2011-09-14 DIAGNOSIS — G518 Other disorders of facial nerve: Secondary | ICD-10-CM

## 2011-09-14 DIAGNOSIS — Z8669 Personal history of other diseases of the nervous system and sense organs: Secondary | ICD-10-CM

## 2011-09-14 DIAGNOSIS — G500 Trigeminal neuralgia: Secondary | ICD-10-CM

## 2011-09-14 HISTORY — DX: Personal history of other diseases of the nervous system and sense organs: Z86.69

## 2011-09-14 MED ORDER — ACYCLOVIR 800 MG PO TABS: ORAL_TABLET | ORAL | Status: DC

## 2011-09-14 MED ORDER — METHYLPREDNISOLONE 4 MG PO KIT: PACK | ORAL | Status: DC

## 2011-09-14 NOTE — Progress Notes (Signed)
OFFICE NOTE  09/14/2011  CC:  Chief Complaint  Patient presents with  . ? Bell's Palsy recurrence    face tingling began last week, face drooping began today     HPI: Patient is a 31 y.o. Caucasian female who is here accompanied by her husband for facial droop. Had onset of left sided facial tingling about a week ago, then this morning her husband noted her left lower face looked droopy.  This is same presentation as last fall when apparently she went to Dr. Quentin Mulling at [facility] and ultimately a dx of Bell's palsy was given (MRI brain normal) and she was rx'd zovirax x 10d and medrol dose pack.  She gradually got total return of normal facial function over the next 9mo or so.   Feeling diffusely tired/generalized weakness today, mildly light-headed, with NO focal weakness anywhere except her face. She says she feels like her left eye may be weak upon opening and closing but she's not sure, and it does not appear to be different than the right on inspection.   No recent illness but Temp today here is 100.4. She did get a nose ring put in yesterday.  ROS: no rash, +Mild HA diffusely today.  No swallowing dysfunction.  Bowel and bladder function normal. LMP 08/08/11--irregular menses are her usual.  No contraceptive use (her husband has had a vasectomy).   Pertinent PMH:  Past Medical History  Diagnosis Date  . Migraines   . Bronchitis, acute 01/18/2011  . Gastroenteritis 05/31/2011  . Suprapubic pain 05/31/2011  . Cervical cancer screening 07/12/2011  . Rectal bleeding 07/13/2011  . Headache 07/13/2011  . Allergic state 07/13/2011  . Bronchitis 07/16/2011  . LUQ pain 08/27/2011  Meniere's disease Facial pain syndrome Bell's palsy (atypical) 01/2011  MEDS:  Outpatient Prescriptions Prior to Visit  Medication Sig Dispense Refill  . aspirin-acetaminophen-caffeine (EXCEDRIN MIGRAINE) 250-250-65 MG per tablet Take 1 tablet by mouth every 6 (six) hours as needed.        .  fluticasone (FLONASE) 50 MCG/ACT nasal spray Place 2 sprays into the nose daily.  16 g  6  . loratadine (CLARITIN) 10 MG tablet Take 1 tablet (10 mg total) by mouth daily.  30 tablet  0  . OXcarbazepine (TRILEPTAL) 150 MG tablet Take 1 tablet by mouth daily as needed.       Marland Kitchen olopatadine (PATANOL) 0.1 % ophthalmic solution Place 1 drop into both eyes 2 (two) times daily.  5 mL  1  . omeprazole (PRILOSEC) 20 MG capsule Take 1 capsule (20 mg total) by mouth daily.  30 capsule  3    PE: Blood pressure 122/81, pulse 67, temperature 100.4 F (38 C), temperature source Temporal, weight 110 lb (49.896 kg), last menstrual period 08/08/2011, SpO2 100.00%. Gen: Alert, well appearing.  Patient is oriented to person, place, time, and situation. Neuro: EOMI, PERRLA.  Eyelids close and open symmetrically and forehead wrinkling is symmetric.   Obvious absence of movement of left lower facial muscles when asked to smile.  Tongue protrusion intact/midline.  Palatal elevation without deviation.  EACs clear, TMs normal.   Strength 5/5 in proximal and distal upper extremities and lower extremities bilaterally.  No sensory deficits except a focal area over left zygomatic region of cheek.   No tremor.  No disdiadochokinesis.  No ataxia.  Upper extremity and lower extremity DTRs symmetric.  No pronator drift.  Babinski is downgoing bilat.   IMPRESSION AND PLAN:  Facial paralysis/Bells palsy Recurrence of  exact same sx's as when she was dx'd with Bell's palsy 01/2011 by neurologist, Dr. Quentin Mulling.  MRI neg at that time. Of note, we discussed the fact that Bell's palsy is typically a nerve palsy that affects both the upper and lower face, so the possibility of a CNS lesion must be entertained when just the lower 1/2 of the face is affected.  However, since MRI was normal at the time of initial dx and sx's were LOWER only at that time as well, we'll forego any further w/u at this time.  This was discussed fully with pt and  husband today and they are in agreement.  Rx'd medrol dose pack (7d) and acyclovir 800mg  five times per day for 10d.  They do request referral to a different neurologist--maybe a younger one that is closer to [facility] area (apparently they had the feeling that Dr. Quentin Mulling was close to retiring).  I ordered referral to Dr. Modesto Charon with [facility] neurology today.  Facial pain syndrome Apparently she was not given sure dx of trigeminal neuralgia, but was given oxcarbazepine and this helps when she just uses it on a prn basis.  History of Meniere's disease Apparently quiescent lately, responded somewhat to DASH diet.  She was intolerant of med that she was rx'd for this (they can't recall the name and I don't have this record). Again, we'll refer to Dr. Modesto Charon at [facility] neuro, gather old records from [facility] from the eval done there last fall.      FOLLOW UP: prn

## 2011-09-14 NOTE — Telephone Encounter (Signed)
I recommend we see her in the office first.  If unable to come in this afternoon, I'd like to talk with either the pt or husband on the phone today to ask some confirmatory questions before calling anything in to pharmacy.  Let me know--thx

## 2011-09-14 NOTE — Assessment & Plan Note (Signed)
Apparently she was not given sure dx of trigeminal neuralgia, but was given oxcarbazepine and this helps when she just uses it on a prn basis.

## 2011-09-14 NOTE — Assessment & Plan Note (Signed)
Apparently quiescent lately, responded somewhat to DASH diet.  She was intolerant of med that she was rx'd for this (they can't recall the name and I don't have this record). Again, we'll refer to Dr. Modesto Charon at [facility] neuro, gather old records from [facility] from the eval done there last fall.

## 2011-09-14 NOTE — Assessment & Plan Note (Addendum)
Recurrence of exact same sx's as when she was dx'd with Bell's palsy 01/2011 by neurologist, Dr. Quentin Mulling.  MRI neg at that time. Of note, we discussed the fact that Bell's palsy is typically a nerve palsy that affects both the upper and lower face, so the possibility of a CNS lesion must be entertained when just the lower 1/2 of the face is affected.  However, since MRI was normal at the time of initial dx and sx's were LOWER only at that time as well, we'll forego any further w/u at this time.  This was discussed fully with pt and husband today and they are in agreement.  Rx'd medrol dose pack (7d) and acyclovir 800mg  five times per day for 10d.  They do request referral to a different neurologist--maybe a younger one that is closer to [facility] area (apparently they had the feeling that Dr. Quentin Mulling was close to retiring).  I ordered referral to Dr. Modesto Charon with [facility] neurology today.

## 2011-09-14 NOTE — Telephone Encounter (Signed)
Please advise? Pts spouse has not called me back?

## 2011-09-14 NOTE — Telephone Encounter (Signed)
Patients husband left a message stating that Danielle Sims seems to be getting her Bells Palsy back? Patient had facial tingles a couple of days ago, and this morning they noticed some drooping on the left side? Pt did get her nose pierced yesterday. Spouse doesn't think this has anything to do with it but he wants MD to know everything. Pts spouse would like a steriod or medication called in? Please advise?  I spoke with patients husband and he states Dr Quentin Mulling diagnosed pt with the Bells Palsy? Pts spouse states he will call me back because he is driving.

## 2011-09-14 NOTE — Telephone Encounter (Signed)
Left a  Message for patient to return our call.

## 2011-09-17 ENCOUNTER — Encounter: Payer: Self-pay | Admitting: Neurology

## 2011-09-17 NOTE — Telephone Encounter (Signed)
Pt came in at 2:30 on Friday

## 2011-09-18 ENCOUNTER — Other Ambulatory Visit: Payer: Self-pay

## 2011-09-18 DIAGNOSIS — R1012 Left upper quadrant pain: Secondary | ICD-10-CM

## 2011-09-18 MED ORDER — OMEPRAZOLE 20 MG PO CPDR: 20.0000 mg | DELAYED_RELEASE_CAPSULE | Freq: Every day | ORAL | Status: DC

## 2011-09-20 ENCOUNTER — Other Ambulatory Visit: Payer: Self-pay | Admitting: *Deleted

## 2011-09-20 DIAGNOSIS — R1012 Left upper quadrant pain: Secondary | ICD-10-CM

## 2011-09-20 MED ORDER — OMEPRAZOLE 20 MG PO CPDR: 20.0000 mg | DELAYED_RELEASE_CAPSULE | Freq: Every day | ORAL | Status: DC

## 2011-09-20 NOTE — Telephone Encounter (Signed)
Faxed refill request received from pharmacy for 90 day supply Omeprazole RX was sent on 09/18/11.  RX resent.

## 2011-10-25 ENCOUNTER — Other Ambulatory Visit: Payer: Self-pay | Admitting: Family Medicine

## 2011-10-25 NOTE — Telephone Encounter (Signed)
Please advise 

## 2011-10-25 NOTE — Telephone Encounter (Signed)
OK to give rx for the generic Montelukast 10 mg po daily prn allergies, disp # 30, 1 rf. I do not know it's availability at local pharmacies or it's PA status

## 2011-10-26 ENCOUNTER — Other Ambulatory Visit: Payer: Self-pay

## 2011-10-26 MED ORDER — MONTELUKAST SODIUM 10 MG PO TABS: 10.0000 mg | ORAL_TABLET | Freq: Every day | ORAL | Status: DC | PRN

## 2011-11-06 ENCOUNTER — Ambulatory Visit: Payer: Managed Care, Other (non HMO) | Admitting: Neurology

## 2011-11-13 ENCOUNTER — Ambulatory Visit (INDEPENDENT_AMBULATORY_CARE_PROVIDER_SITE_OTHER): Payer: Managed Care, Other (non HMO) | Admitting: Family Medicine

## 2011-11-13 ENCOUNTER — Encounter: Payer: Self-pay | Admitting: Family Medicine

## 2011-11-13 ENCOUNTER — Ambulatory Visit (HOSPITAL_BASED_OUTPATIENT_CLINIC_OR_DEPARTMENT_OTHER)
Admission: RE | Admit: 2011-11-13 | Discharge: 2011-11-13 | Disposition: A | Discharge status: Home / Self Care | Payer: Managed Care, Other (non HMO) | Source: Ambulatory Visit | Attending: Family Medicine | Admitting: Family Medicine

## 2011-11-13 VITALS — BP 113/89 | HR 69 | Resp 20

## 2011-11-13 DIAGNOSIS — F419 Anxiety disorder, unspecified: Secondary | ICD-10-CM

## 2011-11-13 DIAGNOSIS — F410 Panic disorder [episodic paroxysmal anxiety] without agoraphobia: Secondary | ICD-10-CM

## 2011-11-13 DIAGNOSIS — K219 Gastro-esophageal reflux disease without esophagitis: Secondary | ICD-10-CM

## 2011-11-13 DIAGNOSIS — IMO0002 Reserved for concepts with insufficient information to code with codable children: Secondary | ICD-10-CM

## 2011-11-13 DIAGNOSIS — J40 Bronchitis, not specified as acute or chronic: Secondary | ICD-10-CM

## 2011-11-13 DIAGNOSIS — R079 Chest pain, unspecified: Secondary | ICD-10-CM | POA: Insufficient documentation

## 2011-11-13 DIAGNOSIS — R05 Cough: Secondary | ICD-10-CM | POA: Insufficient documentation

## 2011-11-13 DIAGNOSIS — R55 Syncope and collapse: Secondary | ICD-10-CM

## 2011-11-13 DIAGNOSIS — G510 Bell's palsy: Secondary | ICD-10-CM

## 2011-11-13 DIAGNOSIS — R059 Cough, unspecified: Secondary | ICD-10-CM | POA: Insufficient documentation

## 2011-11-13 DIAGNOSIS — F411 Generalized anxiety disorder: Secondary | ICD-10-CM

## 2011-11-13 HISTORY — DX: Panic disorder (episodic paroxysmal anxiety): F41.0

## 2011-11-13 LAB — RENAL FUNCTION PANEL
Albumin: 3.9 g/dL (ref 3.5–5.2)
BUN: 11 mg/dL (ref 6–23)
Calcium: 9.5 mg/dL (ref 8.4–10.5)
Creatinine, Ser: 0.6 mg/dL (ref 0.4–1.2)
Glucose, Bld: 87 mg/dL (ref 70–99)
Phosphorus: 2.5 mg/dL (ref 2.3–4.6)

## 2011-11-13 LAB — HEPATIC FUNCTION PANEL
ALT: 19 U/L (ref 0–35)
Albumin: 3.9 g/dL (ref 3.5–5.2)
Bilirubin, Direct: 0 mg/dL (ref 0.0–0.3)
Total Protein: 7.5 g/dL (ref 6.0–8.3)

## 2011-11-13 LAB — CBC
MCV: 90.6 fl (ref 78.0–100.0)
Platelets: 299 10*3/uL (ref 150.0–400.0)
RBC: 4.33 Mil/uL (ref 3.87–5.11)

## 2011-11-13 LAB — SEDIMENTATION RATE: Sed Rate: 18 mm/hr (ref 0–22)

## 2011-11-13 LAB — GLUCOSE, POCT (MANUAL RESULT ENTRY): POC Glucose: 103

## 2011-11-13 LAB — HCG, SERUM, QUALITATIVE: Preg, Serum: NEGATIVE

## 2011-11-13 MED ORDER — ALPRAZOLAM 0.5 MG PO TABS: ORAL_TABLET | ORAL | Status: DC

## 2011-11-13 MED ORDER — ALPRAZOLAM 0.25 MG PO TABS: 0.2500 mg | ORAL_TABLET | Freq: Two times a day (BID) | ORAL | Status: AC | PRN

## 2011-11-13 MED ORDER — CEFDINIR 300 MG PO CAPS: 300.0000 mg | ORAL_CAPSULE | Freq: Two times a day (BID) | ORAL | Status: AC

## 2011-11-13 NOTE — Patient Instructions (Signed)
Costochondritis  Costochondritis (Tietze syndrome), or costochondral separation, is a swelling and irritation (inflammation) of the tissue (cartilage) that connects your ribs with your breastbone (sternum). It may occur on its own (spontaneously), through damage caused by an accident (trauma), or simply from coughing or minor exercise. It may take up to 6 weeks to get better and longer if you are unable to be conservative in your activities.  HOME CARE INSTRUCTIONS    Avoid exhausting physical activity. Try not to strain your ribs during normal activity. This would include any activities using chest, belly (abdominal), and side muscles, especially if heavy weights are used.   Use ice for 15 to 20 minutes per hour while awake for the first 2 days. Place the ice in a plastic bag, and place a towel between the bag of ice and your skin.   Only take over-the-counter or prescription medicines for pain, discomfort, or fever as directed by your caregiver.  SEEK IMMEDIATE MEDICAL CARE IF:    Your pain increases or you are very uncomfortable.   You have a fever.   You develop difficulty with your breathing.   You cough up blood.   You develop worse chest pains, shortness of breath, sweating, or vomiting.   You develop new, unexplained problems (symptoms).  MAKE SURE YOU:    Understand these instructions.   Will watch your condition.   Will get help right away if you are not doing well or get worse.  Document Released: 03/28/2005 Document Revised: 06/07/2011 Document Reviewed: 02/04/2008  [employer] Patient Information 2012 [employer].

## 2011-11-15 ENCOUNTER — Telehealth: Payer: Self-pay | Admitting: Family Medicine

## 2011-11-15 ENCOUNTER — Ambulatory Visit: Payer: Managed Care, Other (non HMO) | Admitting: Neurology

## 2011-11-15 NOTE — Telephone Encounter (Signed)
Today was the first day that patient has been out of house since she was in our office. She has had several "attempts" to have panic attacks so she has stayed at home & slept a lot. Patient said she got lost driving to her son's school today to pick him up. She states that this is a route she often travels but yet today she felt confused and went the wrong way & then passed by her own street on the way home. Patient is going to try to go back to work tomorrow but she would like to talk to you to see if these reactions are normal. Advised patient someone would contact her 11/16/11.

## 2011-11-16 MED ORDER — CITALOPRAM HYDROBROMIDE 20 MG PO TABS: 20.0000 mg | ORAL_TABLET | Freq: Every day | ORAL | Status: DC

## 2011-11-16 NOTE — Telephone Encounter (Signed)
Patient hasn't taken Xanax since Wed. Patient is very confused about local surroundings. Pt is having chest pain all morning. Pt is still having panic attacks.  Patient is wandering if the Oxcarbazepine could be causing the panic attacks because she saw that this is one of the symptoms.

## 2011-11-16 NOTE — Telephone Encounter (Signed)
Xanax is making patient very sleepy so she can't take it while she is trying to work.

## 2011-11-16 NOTE — Telephone Encounter (Signed)
Please advise 

## 2011-11-16 NOTE — Telephone Encounter (Signed)
Pt informed. Pt states she will try the Citalopram but doesn't want to try the Clonazepam yet. Patient will let us know next week if she decides to add the Clonazepam. I will send RX to pharmacy.

## 2011-11-16 NOTE — Telephone Encounter (Signed)
I'd say it is very unlikely that the oxcarbazapine is causing her panic attacks.  I recommend she get started on a daily antidepressant med---tell her this is the usual chronic treatment to make panic attacks less frequent and less severe, even if you are not depressed. We can call in citalopram 20mg , 1 qd, #30, RF x 0.  F/u in office in 3-4 wks with Dr. Abner Greenspan.   We can also try a milder med for acute anxiety instead of the xanax if she wants, for example maybe 0.5mg  clonazepam. Let me know if she wants to try this.--PM

## 2011-11-18 NOTE — Assessment & Plan Note (Signed)
Has appt with neurology soon for consultation, regarding recurrence

## 2011-11-18 NOTE — Assessment & Plan Note (Signed)
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 She was given a GI cocktail and and an EKG. No response to the GI cocktail and her EKG was normal. In our office she is visibly agitated and tremulous. Panting and unable to help with ambulation and transfers for most of her visit. Her spouse picks up a prescription for Xanax and brings it back to the office, 20 minutes after taking one of these she is much calmer and only has a small amount of discomfort remaining. She is sent fo CXR and labs are performed as a precaution which all come back negative. She is given a few extra Alprazolam which she may use prn. Return in 2 weeks for reevaluation or as needed.

## 2011-11-18 NOTE — Progress Notes (Signed)
Patient ID: Danielle Sims, female   DOB: 24-May-1981, 31 y.o.   MRN: [id number] [name] [id number] [birth year] 11/18/2011      Progress Note-Follow Up  Subjective  Chief Complaint  Chief Complaint  Patient presents with  . Panic Attack    questionable, began at work; normal EKG at work, given GI cocktail at work    HPI  Patient is a 31 year old female who is brought in today urgently from her workplace by her husband. 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 They were behind schedule and she was under stress as a result. She began having some left-sided chest pain was given a GI cocktail at work which was not beneficial. Had an EKG which was also normal. They then sent her here for further evaluation when she gets here she is shaking, speaking rapidly, unable to ambulate. She complains that her left leg feels weak. She is shaking diffusely. She describes numbness and tingling in her fingertips and periorally. She does not speak clearly. She has trouble getting a deep breath her heart is racing. In the office while being assisted by 2 nurses she is highly agitated and loses consciousness for an instant. No incontinence or seizure behaviors noted  Past Medical History  Diagnosis Date  . Migraines   . Bronchitis, acute 01/18/2011  . Gastroenteritis 05/31/2011  . Suprapubic pain 05/31/2011  . Cervical cancer screening 07/12/2011  . Rectal bleeding 07/13/2011  . Headache 07/13/2011  . Allergic state 07/13/2011  . Bronchitis 07/16/2011  . LUQ pain 08/27/2011  . Anxiety attack 11/13/2011    Past Surgical History  Procedure Date  . Rhinoplasty     at 31 yo  . Cheek bones corrected     at 31 yo  . Breast enhancement surgery 2008  . Appendectomy 2006    Enlarged  . Upper gastrointestinal endoscopy     Family History  Problem Relation Age of Onset  . Stroke Mother   . Heart disease Mother   . Hyperlipidemia Mother   . Rheum arthritis Mother   . Fibroids Mother     Uterine  .  Fibrocystic breast disease Mother   . Hyperlipidemia Maternal Grandmother   . Osteoarthritis Maternal Grandmother   . Glaucoma Maternal Grandmother   . Cancer Maternal Grandfather     Skin, prostate, throat, stomach    History   Social History  . Marital Status: Married    Spouse Name: N/A    Number of Children: N/A  . Years of Education: N/A   Occupational History  . Not on file.   Social History Main Topics  . Smoking status: Never Smoker   . Smokeless tobacco: Never Used  . Alcohol Use: 0.0 oz/week    0 drink(s) per week     ocassional  . Drug Use: No  . Sexually Active: Yes -- Female partner(s)   Other Topics Concern  . Not on file   Social History Narrative  . No narrative on file    Current Outpatient Prescriptions on File Prior to Visit  Medication Sig Dispense Refill  . acyclovir (ZOVIRAX) 800 MG tablet 1 tab po five times per day x 10d  50 tablet  0  . aspirin-acetaminophen-caffeine (EXCEDRIN MIGRAINE) 250-250-65 MG per tablet Take 1 tablet by mouth every 6 (six) hours as needed.        . citalopram (CELEXA) 20 MG tablet Take 1 tablet (20 mg total) by mouth daily.  30 tablet  0  .  fluticasone (FLONASE) 50 MCG/ACT nasal spray Place 2 sprays into the nose daily.  16 g  6  . loratadine (CLARITIN) 10 MG tablet Take 1 tablet (10 mg total) by mouth daily.  30 tablet  0  . montelukast (SINGULAIR) 10 MG tablet Take 1 tablet (10 mg total) by mouth daily as needed.  90 tablet  0  . olopatadine (PATANOL) 0.1 % ophthalmic solution Place 1 drop into both eyes 2 (two) times daily as needed.      Marland Kitchen omeprazole (PRILOSEC) 20 MG capsule Take 1 capsule (20 mg total) by mouth daily.  90 capsule  1  . OXcarbazepine (TRILEPTAL) 150 MG tablet Take 1 tablet by mouth daily as needed.         No Known Allergies  Review of Systems  Review of Systems  Constitutional: Positive for diaphoresis. Negative for fever and malaise/fatigue.  HENT: Negative for congestion.   Eyes: Negative  for discharge.  Respiratory: Positive for sputum production. Negative for shortness of breath.   Cardiovascular: Positive for chest pain and palpitations. Negative for leg swelling.  Gastrointestinal: Negative for nausea, abdominal pain and diarrhea.  Genitourinary: Negative for dysuria.  Musculoskeletal: Negative for falls.  Skin: Negative for rash.  Neurological: Positive for tingling, tremors, sensory change, loss of consciousness and weakness. Negative for headaches.  Endo/Heme/Allergies: Negative for polydipsia.  Psychiatric/Behavioral: Negative for depression and suicidal ideas. The patient is nervous/anxious. The patient does not have insomnia.     Objective  BP 113/89  Pulse 69  Resp 20  Physical Exam  Physical Exam  Constitutional: She is oriented to person, place, and time. She appears distressed.  HENT:  Head: Normocephalic and atraumatic.  Right Ear: External ear normal.  Left Ear: External ear normal.  Nose: Nose normal.  Eyes: Conjunctivae and EOM are normal. Pupils are equal, round, and reactive to light.  Neck: Neck supple. No thyromegaly present.  Cardiovascular: Normal rate, regular rhythm and normal heart sounds.   No murmur heard. Pulmonary/Chest: Effort normal and breath sounds normal. She has no wheezes.  Abdominal: She exhibits no distension and no mass.  Musculoskeletal: She exhibits no edema and no tenderness.  Lymphadenopathy:    She has no cervical adenopathy.  Neurological: She is alert and oriented to person, place, and time. She has normal reflexes. She displays normal reflexes. No cranial nerve deficit. She exhibits normal muscle tone. Coordination abnormal.       Patient looses consciousness for an instant, witnessed and assisted by 2 CMAs in office. No fall or sustained injury  Skin: Skin is warm. No rash noted. She is diaphoretic.  Psychiatric: Memory normal.       Labile, very anxious    Lab Results  Component Value Date   TSH 2.67  11/13/2011   Lab Results  Component Value Date   WBC 6.0 11/13/2011   HGB 12.9 11/13/2011   HCT 39.2 11/13/2011   MCV 90.6 11/13/2011   PLT 299.0 11/13/2011   Lab Results  Component Value Date   CREATININE 0.6 11/13/2011   BUN 11 11/13/2011   NA 140 11/13/2011   K 4.2 11/13/2011   CL 106 11/13/2011   CO2 26 11/13/2011   Lab Results  Component Value Date   ALT 19 11/13/2011   AST 24 11/13/2011   ALKPHOS 56 11/13/2011   BILITOT 0.3 11/13/2011   Lab Results  Component Value Date   CHOL 169 07/19/2011   Lab Results  Component Value Date   HDL  52.40 07/19/2011   Lab Results  Component Value Date   LDLCALC 92 07/19/2011   Lab Results  Component Value Date   TRIG 123.0 07/19/2011   Lab Results  Component Value Date   CHOLHDL 3 07/19/2011     Assessment & Plan  Anxiety attack 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 She was given a GI cocktail and and an EKG. No response to the GI cocktail and her EKG was normal. In our office she is visibly agitated and tremulous. Panting and unable to help with ambulation and transfers for most of her visit. Her spouse picks up a prescription for Xanax and brings it back to the office, 20 minutes after taking one of these she is much calmer and only has a small amount of discomfort remaining. She is sent fo CXR and labs are performed as a precaution which all come back negative. She is given a few extra Alprazolam which she may use prn. Return in 2 weeks for reevaluation or as needed.  Facial paralysis/Bells palsy Has appt with neurology soon for consultation, regarding recurrence  GERD May need to consider adding ranitidine to the omeprazole she is already taking    Spent 60 minutes with this patient

## 2011-11-18 NOTE — Assessment & Plan Note (Signed)
May need to consider adding ranitidine to the omeprazole she is already taking

## 2011-11-22 ENCOUNTER — Ambulatory Visit: Payer: Managed Care, Other (non HMO) | Admitting: Neurology

## 2011-11-29 ENCOUNTER — Encounter: Payer: Self-pay | Admitting: Family Medicine

## 2011-11-29 ENCOUNTER — Ambulatory Visit (INDEPENDENT_AMBULATORY_CARE_PROVIDER_SITE_OTHER): Payer: Managed Care, Other (non HMO) | Admitting: Family Medicine

## 2011-11-29 VITALS — BP 114/73 | HR 69 | Temp 99.2°F | Ht 59.25 in | Wt 110.8 lb

## 2011-11-29 DIAGNOSIS — R0602 Shortness of breath: Secondary | ICD-10-CM

## 2011-11-29 DIAGNOSIS — L739 Follicular disorder, unspecified: Secondary | ICD-10-CM

## 2011-11-29 DIAGNOSIS — J40 Bronchitis, not specified as acute or chronic: Secondary | ICD-10-CM

## 2011-11-29 DIAGNOSIS — L738 Other specified follicular disorders: Secondary | ICD-10-CM

## 2011-11-29 DIAGNOSIS — G510 Bell's palsy: Secondary | ICD-10-CM

## 2011-11-29 DIAGNOSIS — F410 Panic disorder [episodic paroxysmal anxiety] without agoraphobia: Secondary | ICD-10-CM

## 2011-11-29 DIAGNOSIS — Z87898 Personal history of other specified conditions: Secondary | ICD-10-CM

## 2011-11-29 DIAGNOSIS — R0789 Other chest pain: Secondary | ICD-10-CM

## 2011-11-29 DIAGNOSIS — T753XXA Motion sickness, initial encounter: Secondary | ICD-10-CM

## 2011-11-29 HISTORY — DX: Other chest pain: R07.89

## 2011-11-29 HISTORY — DX: Follicular disorder, unspecified: L73.9

## 2011-11-29 MED ORDER — ACYCLOVIR 800 MG PO TABS: 800.0000 mg | ORAL_TABLET | Freq: Three times a day (TID) | ORAL | Status: DC

## 2011-11-29 MED ORDER — PREDNISONE 20 MG PO TABS: 20.0000 mg | ORAL_TABLET | Freq: Two times a day (BID) | ORAL | Status: AC

## 2011-11-29 MED ORDER — SCOPOLAMINE 1 MG/3DAYS TD PT72: 1.0000 | MEDICATED_PATCH | TRANSDERMAL | Status: DC

## 2011-11-29 NOTE — Assessment & Plan Note (Signed)
Patient is leaving on a cruise and fishing soon is given a prescription for Scopolamine patch to try given her history

## 2011-11-29 NOTE — Progress Notes (Signed)
Patient ID: Danielle Sims, female   DOB: 06-24-1981, 31 y.o.   MRN: [id number] Danielle Sims [id number] 21-Nov-1980 11/29/2011      Progress Note-Follow Up  Subjective  Chief Complaint  Chief Complaint  Patient presents with  . bells palsy symptoms  . lump in buttocks    painful in crack     HPI  Patient is a 31 year old Hispanic female who is in today for followup. At her last visit she came in with atypical chest pain and severe anxiety. In the evening dose of Xanax before finally being able to catch her breath however. She's not had any symptoms this year at her last visit with the tip at the time with good results. He has not started the citalopram as prescribed she feels a pulsation manages her anxiety RI and she is concerned that he continues to have atypical chest pain. They can be sharp, left upper chest wall fleeting lasting only seconds. No associated symptoms at the time they occur such as shortness of breath or palpitations. No diaphoresis or nausea but she does note off of the next 24 hours her left shoulder and left arm feels he she denies any certain activities bring on her symptoms if she is overly anxious about them. Her paternal uncle did die of sudden cardiac death at 74 and her mother is being worked up for cardiac disease in her 56s. Is also complaining of a follicular lesion medial cleft which is mildly tender. Appears to be improving. No fluctuance or erythema. She's leaving on a cruise and requesting a prescription for some scopolamine.  Past Medical History  Diagnosis Date  . Migraines   . Bronchitis, acute 01/18/2011  . Gastroenteritis 05/31/2011  . Suprapubic pain 05/31/2011  . Cervical cancer screening 07/12/2011  . Rectal bleeding 07/13/2011  . Headache 07/13/2011  . Allergic state 07/13/2011  . Bronchitis 07/16/2011  . LUQ pain 08/27/2011  . Anxiety attack 11/13/2011  . History of motion sickness 11/29/2011  . Chest pain, atypical 11/29/2011  . Folliculitis 11/29/2011     Past Surgical History  Procedure Date  . Rhinoplasty     at 31 yo  . Cheek bones corrected     at 31 yo  . Breast enhancement surgery 2008  . Appendectomy 2006    Enlarged  . Upper gastrointestinal endoscopy     Family History  Problem Relation Age of Onset  . Stroke Mother   . Heart disease Mother   . Hyperlipidemia Mother   . Rheum arthritis Mother   . Fibroids Mother     Uterine  . Fibrocystic breast disease Mother   . Hyperlipidemia Maternal Grandmother   . Osteoarthritis Maternal Grandmother   . Glaucoma Maternal Grandmother   . Cancer Maternal Grandfather     Skin, prostate, throat, stomach    History   Social History  . Marital Status: Married    Spouse Name: N/A    Number of Children: N/A  . Years of Education: N/A   Occupational History  . Not on file.   Social History Main Topics  . Smoking status: Never Smoker   . Smokeless tobacco: Never Used  . Alcohol Use: 0.0 oz/week    0 drink(s) per week     ocassional  . Drug Use: No  . Sexually Active: Yes -- Female partner(s)   Other Topics Concern  . Not on file   Social History Narrative  . No narrative on file    Current Outpatient Prescriptions on  File Prior to Visit  Medication Sig Dispense Refill  . aspirin-acetaminophen-caffeine (EXCEDRIN MIGRAINE) 250-250-65 MG per tablet Take 1 tablet by mouth every 6 (six) hours as needed.        . fluticasone (FLONASE) 50 MCG/ACT nasal spray Place 2 sprays into the nose daily.  16 g  6  . montelukast (SINGULAIR) 10 MG tablet Take 1 tablet (10 mg total) by mouth daily as needed.  90 tablet  0  . olopatadine (PATANOL) 0.1 % ophthalmic solution Place 1 drop into both eyes 2 (two) times daily as needed.      . ALPRAZolam (XANAX) 0.25 MG tablet Take 1 tablet (0.25 mg total) by mouth 2 (two) times daily as needed for sleep or anxiety.  10 tablet  0  . citalopram (CELEXA) 20 MG tablet Take 1 tablet (20 mg total) by mouth daily.  30 tablet  0  . diphenhydrAMINE  (BENADRYL) 25 MG tablet Take 1 tablet (25 mg total) by mouth every 6 (six) hours as needed for itching.  30 tablet  0  . omeprazole (PRILOSEC) 20 MG capsule Take 1 capsule (20 mg total) by mouth daily.  90 capsule  1    No Known Allergies  Review of Systems  Review of Systems  Constitutional: Negative for fever and malaise/fatigue.  HENT: Negative for congestion.   Eyes: Negative for discharge.  Respiratory: Positive for shortness of breath.        With exertion  Cardiovascular: Positive for chest pain. Negative for palpitations and leg swelling.       Sharp, fleeting LU chest wall  Gastrointestinal: Negative for nausea, abdominal pain and diarrhea.  Genitourinary: Negative for dysuria.  Musculoskeletal: Positive for myalgias and joint pain. Negative for falls.       Left shoulder and upper arm  Skin: Positive for rash.       Tender bump left gluteal cleft  Neurological: Negative for loss of consciousness and headaches.  Endo/Heme/Allergies: Negative for polydipsia.  Psychiatric/Behavioral: Negative for depression and suicidal ideas. The patient is not nervous/anxious and does not have insomnia.     Objective  BP 114/73  Pulse 69  Temp(Src) 99.2 F (37.3 C) (Temporal)  Ht 4' 11.25" (1.505 m)  Wt 110 lb 12.8 oz (50.259 kg)  BMI 22.19 kg/m2  SpO2 100%  LMP 10/16/2011  Physical Exam  Physical Exam  Vitals reviewed. Constitutional: She is oriented to person, place, and time and well-developed, well-nourished, and in no distress. No distress.  HENT:  Head: Normocephalic and atraumatic.  Eyes: Conjunctivae are normal.  Neck: Neck supple. No thyromegaly present.  Cardiovascular: Normal rate, regular rhythm and normal heart sounds.   No murmur heard. Pulmonary/Chest: Effort normal and breath sounds normal. She has no wheezes.  Abdominal: She exhibits no distension and no mass.  Musculoskeletal: She exhibits no edema.  Lymphadenopathy:    She has no cervical adenopathy.    Neurological: She is alert and oriented to person, place, and time.  Skin: Skin is warm and dry. No rash noted. She is not diaphoretic.       1 cm, firm round raised lesion left gluteal cleft, no surrounding erythema  Psychiatric: Memory, affect and judgment normal.    Lab Results  Component Value Date   TSH 2.67 11/13/2011   Lab Results  Component Value Date   WBC 6.0 11/13/2011   HGB 12.9 11/13/2011   HCT 39.2 11/13/2011   MCV 90.6 11/13/2011   PLT 299.0 11/13/2011   Lab  Results  Component Value Date   CREATININE 0.6 11/13/2011   BUN 11 11/13/2011   NA 140 11/13/2011   K 4.2 11/13/2011   CL 106 11/13/2011   CO2 26 11/13/2011   Lab Results  Component Value Date   ALT 19 11/13/2011   AST 24 11/13/2011   ALKPHOS 56 11/13/2011   BILITOT 0.3 11/13/2011   Lab Results  Component Value Date   CHOL 169 07/19/2011   Lab Results  Component Value Date   HDL 52.40 07/19/2011   Lab Results  Component Value Date   LDLCALC 92 07/19/2011   Lab Results  Component Value Date   TRIG 123.0 07/19/2011   Lab Results  Component Value Date   CHOLHDL 3 07/19/2011     Assessment & Plan  Anxiety attack Patient had a good response to Xanax at last visit and slept the rest of the day. She has used a 1/2 tab once since then with good results and has not had another episode of impending doom. She does continue to have some intermittent, sharp chest wall pain which causes her anxiety to increase but she is able to calm herself before she gets panicked. She had been given an rx for Citalopram but thus far she has chosen not to take it.   Facial paralysis/Bells palsy Has recurrent episodes of facial drooping and pain on left side of face and has appt with neurology soon. Is also leaving on several vacations soon and is worried about having a flare while she is gone. Is given prescriptions for Acyclovir and Prednisone to use prn  If she has a flare  History of motion sickness Patient is leaving on a  cruise and fishing soon is given a prescription for Scopolamine patch to try given her history  Chest pain, atypical She describes an intermittent sharp chest pain. It is usually left upper outer chest wall. She does not associated with particular activities or positions. Does seem to occur more in the evenings. She also notes after a day with more pain she often feels the next days if her left arm is slightly weak and uncomfortable. She's been having these episodes for years but recently they have been more frequent. She is concerned she had a cardiac condition secondary to a paternal uncle who died of sudden cardiac death at 68 years old and her mother now being diagnosed with cardiac disease in her mid 28s. Patient suggests a holter monitor for reassurance but is not describing palpitations. Will send her for cardiac consultation for reassurance. She does also c/o increased sob with exertion  Folliculitis Small, resolving lesion left gluteal cleft, no fluctuance or erythema. She is to report any worsening or persistent symptoms

## 2011-11-29 NOTE — Assessment & Plan Note (Addendum)
Patient had a good response to Xanax at last visit and slept the rest of the day. She has used a 1/2 tab once since then with good results and has not had another episode of impending doom. She does continue to have some intermittent, sharp chest wall pain which causes her anxiety to increase but she is able to calm herself before she gets panicked. She had been given an rx for Citalopram but thus far she has chosen not to take it.

## 2011-11-29 NOTE — Patient Instructions (Signed)
Motion Sickness Motion sickness is an unpleasant, temporary feeling of dizziness, nausea, and vomiting that occurs when a person is traveling. It can occur during travel by boat, car, airplane, or even on an amusement park ride. The symptoms of motion sickness usually get better once the motion or traveling stops, but problems may persist for hours or days. CAUSES  Motion of the body can cause fluid changes in your inner ear, which can result in motion sickness. Some people are more susceptible to motion sickness than others. Stress, other illnesses, or drinking too much alcohol may add to motion sickness. SYMPTOMS   Nausea.   Dizziness.   Unsteadiness when walking.   Vomiting.  DIAGNOSIS  Motion sickness is diagnosed based on your symptoms while you are traveling or moving. TREATMENT  Most people improve rapidly after the motion stops. There are also over-the-counter medicines that can help with motion sickness. Your caregiver can prescribe motion sickness patches. These patches are placed behind your ear. PREVENTION   Avoid situations that cause your motion sickness, if possible.   Consider taking medicines such as meclizine or dimenhydrinate before going on a trip that may cause motion sickness.   Do not eat large meals or drink alcohol before or during travel.   Take small, frequent sips of liquids as needed.   Sit in an area of the airplane or boat with the least motion. On an airplane, sit near the wing. Lie back in your seat, if possible. When riding in a car, try to avoid sitting in the backseat.   Breathe slowly and deeply.   Do not read or focus on nearby objects, if possible, especially if the water or air is rough. However, watching the horizon or a distant object is sometimes helpful, especially in a boat.   Avoid areas where people are smoking, if possible.   Plan ahead for vacations. Your caregiver can help you with a prescription or suggestions for over-the-counter  medicines.  HOME CARE INSTRUCTIONS   Only take over-the-counter or prescription medicines as directed by your caregiver.   If you use a motion sickness patch, wash your hands after you put the patch on. Touching your hands to your eyes after using the patch can enlarge (dilate) your pupils for 1 to 2 days and disturb your vision.  SEEK MEDICAL CARE IF:   Your vomiting or nausea cannot be controlled with medicine and rest.   You notice blood in your vomit. This could be dark red or look like coffee grounds.   You faint or have severe dizziness or lightheadedness upon standing. These may be signs of dehydration.   You have a fever.   You have severe abdominal or chest pain.   You have trouble breathing.   You have a severe headache.   You develop weakness or numbness on one side of the body.   You have trouble speaking.  MAKE SURE YOU:   Understand these instructions.   Will watch your condition.   Will get help right away if you are not doing well or get worse.  Document Released: 06/18/2005 Document Revised: 06/07/2011 Document Reviewed: 01/16/2011 [facility] Patient Information 2012 [employer], [place].

## 2011-11-29 NOTE — Assessment & Plan Note (Signed)
Small, resolving lesion left gluteal cleft, no fluctuance or erythema. She is to report any worsening or persistent symptoms

## 2011-11-29 NOTE — Assessment & Plan Note (Addendum)
She describes an intermittent sharp chest pain. It is usually left upper outer chest wall. She does not associated with particular activities or positions. Does seem to occur more in the evenings. She also notes after a day with more pain she often feels the next days if her left arm is slightly weak and uncomfortable. She's been having these episodes for years but recently they have been more frequent. She is concerned she had a cardiac condition secondary to a paternal uncle who died of sudden cardiac death at 64 years old and her mother now being diagnosed with cardiac disease in her mid 71s. Patient suggests a holter monitor for reassurance but is not describing palpitations. Will send her for cardiac consultation for reassurance. She does also c/o increased sob with exertion

## 2011-11-29 NOTE — Assessment & Plan Note (Signed)
Has recurrent episodes of facial drooping and pain on left side of face and has appt with neurology soon. Is also leaving on several vacations soon and is worried about having a flare while she is gone. Is given prescriptions for Acyclovir and Prednisone to use prn  If she has a flare

## 2011-12-03 ENCOUNTER — Other Ambulatory Visit: Payer: Self-pay

## 2011-12-03 ENCOUNTER — Ambulatory Visit: Payer: Managed Care, Other (non HMO) | Admitting: Cardiovascular Disease

## 2011-12-27 ENCOUNTER — Ambulatory Visit: Payer: Managed Care, Other (non HMO) | Admitting: Cardiovascular Disease

## 2012-01-08 ENCOUNTER — Ambulatory Visit (INDEPENDENT_AMBULATORY_CARE_PROVIDER_SITE_OTHER): Payer: Managed Care, Other (non HMO) | Admitting: *Deleted

## 2012-01-08 DIAGNOSIS — Z111 Encounter for screening for respiratory tuberculosis: Secondary | ICD-10-CM

## 2012-01-08 NOTE — Progress Notes (Signed)
Pt presented for TB and to have forms filled out for college.  TB test placed today and she will come back on Thursday to have read.  Advised we will try to have forms ready by that time.  Immunization record provided by patient to complete form.

## 2012-01-10 ENCOUNTER — Ambulatory Visit (INDEPENDENT_AMBULATORY_CARE_PROVIDER_SITE_OTHER): Payer: Managed Care, Other (non HMO) | Admitting: Family Medicine

## 2012-01-10 ENCOUNTER — Encounter: Payer: Self-pay | Admitting: Family Medicine

## 2012-01-10 ENCOUNTER — Ambulatory Visit (INDEPENDENT_AMBULATORY_CARE_PROVIDER_SITE_OTHER)
Admission: RE | Admit: 2012-01-10 | Discharge: 2012-01-10 | Disposition: A | Discharge status: Home / Self Care | Payer: Managed Care, Other (non HMO) | Source: Ambulatory Visit | Attending: Family Medicine | Admitting: Family Medicine

## 2012-01-10 VITALS — BP 107/78 | HR 69 | Ht 59.25 in | Wt 112.0 lb

## 2012-01-10 DIAGNOSIS — L509 Urticaria, unspecified: Secondary | ICD-10-CM

## 2012-01-10 DIAGNOSIS — N926 Irregular menstruation, unspecified: Secondary | ICD-10-CM

## 2012-01-10 DIAGNOSIS — N939 Abnormal uterine and vaginal bleeding, unspecified: Secondary | ICD-10-CM

## 2012-01-10 DIAGNOSIS — R7611 Nonspecific reaction to tuberculin skin test without active tuberculosis: Secondary | ICD-10-CM

## 2012-01-10 HISTORY — DX: Nonspecific reaction to tuberculin skin test without active tuberculosis: R76.11

## 2012-01-10 MED ORDER — DESOGESTREL-ETHINYL ESTRADIOL 0.15-0.02/0.01 MG (21/5) PO TABS: 1.0000 | ORAL_TABLET | Freq: Every day | ORAL | Status: DC

## 2012-01-10 MED ORDER — METHYLPREDNISOLONE 4 MG PO KIT: PACK | ORAL | Status: AC

## 2012-01-10 MED ORDER — LORATADINE 10 MG PO TABS: ORAL_TABLET | ORAL | Status: DC

## 2012-01-10 MED ORDER — TRIAMCINOLONE ACETONIDE 0.1 % EX CREA: TOPICAL_CREAM | Freq: Two times a day (BID) | CUTANEOUS | Status: DC | PRN

## 2012-01-10 NOTE — Patient Instructions (Addendum)
Tuberculosis Tuberculosis (TB) is a serious infection. TB often attacks the lungs, but any part of the body can be affected. TB can spread from person to person (contagious). TB is often spread by coughing or sneezing. TB can be treated with medicines. If TB is not treated, it can be life-threatening. HOME CARE  Take your medicines (antibiotics) as told. Finish them even if you start to feel better.   Keep all doctor visits as told. You will have doctor visits for at least 2 years.   Tell your doctor about all the people you live with or have close contact with. They may need to be tested for TB.   Eat a healthy diet.   Rest as needed.   Until your doctor says it is okay:   Avoid close contact with others, especially babies and older people.   Cover your mouth and nose when you cough or sneeze. Throw away used tissues properly.   Wash your hands often with soap and water.   Do not go back to work or school.  GET HELP RIGHT AWAY IF:   You have chest pain.   You cough up blood.   You have trouble breathing or shortness of breath.   You have a headache or stiff neck.   You have a fever.   The patient is older than 3 months with a rectal temperature of 102 F (38.9 C) or higher.   The patient is 1 months old or younger with a rectal temperature of 100.4 F (38 C) or higher.   You have new problems that may be caused by your medicine.   You lose your appetite, feel sick to your stomach (nauseous), or throw up (vomit).   Your pee (urine) is dark yellow.   Your skin or the white part of your eyes turns yellow.   Your problems do not go away or get worse.   You have a new cough, or your cough lasts longer than 3 to 4 weeks.   You keep losing weight.  MAKE SURE YOU:  Understand these instructions.   Will watch your condition.   Will get help right away if you are not doing well or get worse.  Document Released: 04/15/2009 Document Revised: 06/07/2011 Document  Reviewed: 02/15/2011 [facility] Patient Information 2012 [place], [place].

## 2012-01-11 ENCOUNTER — Encounter: Payer: Self-pay | Admitting: Family Medicine

## 2012-01-11 DIAGNOSIS — L509 Urticaria, unspecified: Secondary | ICD-10-CM

## 2012-01-11 HISTORY — DX: Urticaria, unspecified: L50.9

## 2012-01-11 NOTE — Assessment & Plan Note (Signed)
Migrating pruritic, erythematous raised lesions for past 2 days. Will treat with Allegra bid and Benadryl qhs as needed and given Triamcinolone to use prn. Report if symptoms worsen

## 2012-01-11 NOTE — Progress Notes (Signed)
Patient ID: Danielle Sims, female   DOB: 13-Oct-1980, 31 y.o.   MRN: [id number] Danielle [place] [id number] 04/12/1981 01/11/2012      Progress Note-Follow Up  Subjective  Chief Complaint  Chief Complaint  Patient presents with  . Follow-up    swelling in hands,     HPI  31 year old female who is in today to have her PPD read and is noted to have a reaction. She feels well. She denies any illness, fevers, congestion, cough, hemoptysis, chest pain, palpitations, shortness of breath, GI or GU concerns. She reports her anxiety is been well-controlled and she's had any panic attacks. She is to [employer] and is much happier in her new work and is preparing to return to school. She further reports that as a child she had the immunization against tuberculosis in her native country of [place]. 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 She notes the last 2 days she's also had migrating urticarial lesions. They pop up in various places arms, trunk are erythematous, raised red and didn't move. She has not had any treatment for them He began about 2 days ago  Past Medical History  Diagnosis Date  . Migraines   . Bronchitis, acute 01/18/2011  . Gastroenteritis 05/31/2011  . Suprapubic pain 05/31/2011  . Cervical cancer screening 07/12/2011  . Rectal bleeding 07/13/2011  . Headache 07/13/2011  . Allergic state 07/13/2011  . Bronchitis 07/16/2011  . LUQ pain 08/27/2011  . Anxiety attack 11/13/2011  . History of motion sickness 11/29/2011  . Chest pain, atypical 11/29/2011  . Folliculitis 11/29/2011  . 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 Rhinoplasty     at 31 yo  . Cheek bones corrected     at 31 yo  . Breast enhancement surgery 2008  . Appendectomy 2006    Enlarged  . Upper gastrointestinal endoscopy     Family History  Problem Relation Age of Onset  . Stroke Mother   . Heart disease Mother   .  Hyperlipidemia Mother   . Rheum arthritis Mother   . Fibroids Mother     Uterine  . Fibrocystic breast disease Mother   . Hyperlipidemia Maternal Grandmother   . Osteoarthritis Maternal Grandmother   . Glaucoma Maternal Grandmother   . Cancer Maternal Grandfather     Skin, prostate, throat, stomach    History   Social History  . Marital Status: Married    Spouse Name: N/A    Number of Children: N/A  . Years of Education: N/A   Occupational History  . Not on file.   Social History Main Topics  . Smoking status: Never Smoker   . Smokeless tobacco: Never Used  . Alcohol Use: 0.0 oz/week    0 drink(s) per week     ocassional  . Drug Use: No  . Sexually Active: Yes -- Female partner(s)   Other Topics Concern  . Not on file   Social History Narrative  . No narrative on file    Current Outpatient Prescriptions on File Prior to Visit  Medication Sig Dispense Refill  . diphenhydrAMINE (BENADRYL) 25 MG tablet Take 1 tablet (25 mg total) by mouth every 6 (six) hours as needed for itching.  30 tablet  0  . acyclovir (ZOVIRAX) 800 MG tablet Take 1 tablet (800 mg total) by mouth 3 (three) times daily.  21 tablet  1  . aspirin-acetaminophen-caffeine (EXCEDRIN MIGRAINE) 250-250-65 MG per tablet Take 1 tablet by mouth every 6 (six) hours as needed.        . citalopram (CELEXA) 20 MG tablet Take 1 tablet (20 mg total) by mouth daily.  30 tablet  0  . desogestrel-ethinyl estradiol (KARIVA) 0.15-0.02/0.01 MG (21/5) tablet Take 1 tablet by mouth daily.  1 Package  0  . diphenhydrAMINE (BENADRYL) 25 mg capsule Take 25 mg by mouth at bedtime.      . fluticasone (FLONASE) 50 MCG/ACT nasal spray Place 2 sprays into the nose daily.  16 g  6  . montelukast (SINGULAIR) 10 MG tablet Take 1 tablet (10 mg total) by mouth daily as needed.  90 tablet  0  . olopatadine (PATANOL) 0.1 % ophthalmic solution Place 1 drop into both eyes 2 (two) times daily as needed.      Marland Kitchen omeprazole (PRILOSEC) 20 MG  capsule Take 1 capsule (20 mg total) by mouth daily.  90 capsule  1  . scopolamine (TRANSDERM-SCOP) 1.5 MG Place 1 patch (1.5 mg total) onto the skin every 3 (three) days.  4 patch  0    No Known Allergies  Review of Systems  Review of Systems  Constitutional: Negative for fever and malaise/fatigue.  HENT: Negative for congestion.   Eyes: Negative for discharge.  Respiratory: Negative for shortness of breath.   Cardiovascular: Negative for chest pain, palpitations and leg swelling.  Gastrointestinal: Negative for nausea, abdominal pain and diarrhea.  Genitourinary: Negative for dysuria.  Musculoskeletal: Negative for falls.  Skin: Negative for rash.  Neurological: Negative for loss of consciousness and headaches.  Endo/Heme/Allergies: Negative for polydipsia.  Psychiatric/Behavioral: Negative for depression and suicidal ideas. The patient is not nervous/anxious and does not have insomnia.     Objective  BP 107/78  Pulse 69  Ht 4' 11.25" (1.505 m)  Wt 112 lb (50.803 kg)  BMI 22.43 kg/m2  LMP 12/16/2011  Physical Exam  Physical Exam  Constitutional: She is oriented to person, place, and time and well-developed, well-nourished, and in no distress. No distress.  HENT:  Head: Normocephalic and atraumatic.  Eyes: Conjunctivae are normal.  Neck: Neck supple. No thyromegaly present.  Cardiovascular: Normal rate, regular rhythm and normal heart sounds.   No murmur heard. Pulmonary/Chest: Effort normal and breath sounds normal. She has no wheezes.  Abdominal: She exhibits no distension and no mass.  Musculoskeletal: She exhibits no edema.  Lymphadenopathy:    She has no cervical adenopathy.  Neurological: She is alert and oriented to person, place, and time.  Skin: Skin is warm and dry. Rash noted. She is not diaphoretic. There is erythema.       Left forearm 4x3 erythematous oval with 1.8 x 1.8 cm raised center. Also with a urticarial reaction, raised erythematous on right arm   Psychiatric: Memory, affect and judgment normal.    Lab Results  Component Value Date   TSH 2.67 11/13/2011   Lab Results  Component Value Date   WBC 6.0 11/13/2011   HGB 12.9 11/13/2011   HCT 39.2 11/13/2011   MCV 90.6 11/13/2011   PLT 299.0 11/13/2011   Lab Results  Component Value Date   CREATININE 0.6 11/13/2011   BUN 11 11/13/2011   NA 140 11/13/2011   K 4.2 11/13/2011   CL 106 11/13/2011   CO2 26 11/13/2011   Lab Results  Component Value Date   ALT 19 11/13/2011   AST 24 11/13/2011   ALKPHOS  56 11/13/2011   BILITOT 0.3 11/13/2011   Lab Results  Component Value Date   CHOL 169 07/19/2011   Lab Results  Component Value Date   HDL 52.40 07/19/2011   Lab Results  Component Value Date   LDLCALC 92 07/19/2011   Lab Results  Component Value Date   TRIG 123.0 07/19/2011   Lab Results  Component Value Date   CHOLHDL 3 07/19/2011     Assessment & Plan  PPD positive Patient has erythematous patch 4x3 cm and the raised, indurated portion is 1.8 x 1.8 cm. She now reports a history of immunization as a child in [place], as well as a positive ppd in past followed by some unspecified treatment and then some negative ppd's since then. She feels well, has a negative CXR and denies any cough, fevers, hemoptysis etc. It is unlikely that she will need treatment but we will report the case to the health department for verification  Urticaria Migrating pruritic, erythematous raised lesions for past 2 days. Will treat with Allegra bid and Benadryl qhs as needed and given Triamcinolone to use prn. Report if symptoms worsen

## 2012-01-11 NOTE — Assessment & Plan Note (Addendum)
Patient has erythematous patch 4x3 cm and the raised, indurated portion is 1.8 x 1.8 cm. She now reports a history of immunization as a child in [place], as well as a positive ppd in past followed by some unspecified treatment and then some negative ppd's since then. She feels well, has a negative CXR and denies any cough, fevers, hemoptysis etc. It is unlikely that she will need treatment but we will report the case to the health department for verification

## 2012-01-29 ENCOUNTER — Encounter: Payer: Self-pay | Admitting: Neurology

## 2012-01-29 ENCOUNTER — Other Ambulatory Visit (INDEPENDENT_AMBULATORY_CARE_PROVIDER_SITE_OTHER): Payer: Managed Care, Other (non HMO)

## 2012-01-29 ENCOUNTER — Ambulatory Visit (INDEPENDENT_AMBULATORY_CARE_PROVIDER_SITE_OTHER): Payer: Managed Care, Other (non HMO) | Admitting: Neurology

## 2012-01-29 VITALS — BP 100/70 | HR 72 | Ht 59.25 in | Wt 112.0 lb

## 2012-01-29 DIAGNOSIS — D869 Sarcoidosis, unspecified: Secondary | ICD-10-CM

## 2012-01-29 DIAGNOSIS — G529 Cranial nerve disorder, unspecified: Secondary | ICD-10-CM

## 2012-01-29 NOTE — Patient Instructions (Addendum)
Go to the basement to have your labs drawn today.  Your Chest CT is schedule for Thursday, August 1 at 11:00am.  Please arrive to [facility] CT by 10:45am.  Nothing to eat or drink after midnight.  [address] Third floor.  [phone].  Your MRI is scheduled for Friday, August 2 at 3:00pm.  Please arrive to [facility], first floor admitting by 2:45pm.  [phone].

## 2012-01-29 NOTE — Progress Notes (Signed)
Dear Dr. 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  As you may recall, she is a 31 y.o. year old female with a history of positive ppd test, migraines, sinus congestion who presents with a six year history of facial pain involving the left face described as shooting in quality.  Not clearly triggered it typically lasts up to 3-4 days, and then remits.  It is frequently associated with tinnitus in the ear.  It occurs about every month.  She was on oxcarbazepine for this but now only takes it PRN as she is worried the OXC may have caused a panic attack.  In addition, she developed left sided facial paresis on 01/2011 that lasted about six months.  It was initially treated with steroids and anti-virals.   It was accompanied by fever. Interestingly it only involve her lower face. She was seen by Dr. Quentin Mulling at [facility] who also looked at her MRI brain which was read as normal.  She also developed numbness in the left face at the same time as well as lack of taste in the left tongue.  She did not have hyperacusis.  In January she had another worsening of her facial paresis.  She was given steroids again and anti-virals with gradual improvement.  She had a fever with this event as well.  She has had a reaction to the PPD(hx of BCG) but has had a CXR which was normal.  She has no significant joint pain or previous diagnosis of auto-immune diseases.  She had a negative HIV test 2 years ago.  She also carries a diagnosis of Meniere's disease.  This is described as unrelenting vertigo lasting several days.  She has had two spells, and according to her during one she had her ENT check her hearing and it was abnormal at that time.  She does not describe worsening of tinnitus during that time.  Past Medical History  Diagnosis Date  . Migraines   . Bronchitis, acute 01/18/2011  .  Gastroenteritis 05/31/2011  . Suprapubic pain 05/31/2011  . Cervical cancer screening 07/12/2011  . Rectal bleeding 07/13/2011  . Headache 07/13/2011  . Allergic state 07/13/2011  . Bronchitis 07/16/2011  . LUQ pain 08/27/2011  . Anxiety attack 11/13/2011  . History of motion sickness 11/29/2011  . Chest pain, atypical 11/29/2011  . Folliculitis 11/29/2011  . 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 Urticaria 01/11/2012    Past Surgical History  Procedure Date  . Rhinoplasty     at 31 yo  . Cheek bones corrected     at 31 yo  . Breast enhancement surgery 2008  . Appendectomy 2006    Enlarged  . Upper gastrointestinal endoscopy     History   Social History  . Marital Status: Married    Spouse Name: N/A    Number of Children: N/A  . Years of Education: N/A   Social History Main Topics  . Smoking status: Never Smoker   . Smokeless tobacco: Never Used  . Alcohol Use: 0.0 oz/week    0 drink(s) per week     ocassional  . Drug Use: No  . Sexually Active: Yes -- Female partner(s)   Other Topics Concern  . None   Social History Narrative  . None    Family History  Problem Relation Age of Onset  . Stroke Mother   . Heart  disease Mother   . Hyperlipidemia Mother   . Rheum arthritis Mother   . Fibroids Mother     Uterine  . Fibrocystic breast disease Mother   . Hyperlipidemia Maternal Grandmother   . Osteoarthritis Maternal Grandmother   . Glaucoma Maternal Grandmother   . Cancer Maternal Grandfather     Skin, prostate, throat, stomach    Current Outpatient Prescriptions on File Prior to Visit  Medication Sig Dispense Refill  . ALPRAZolam (XANAX) 0.5 MG tablet as needed.      Marland Kitchen aspirin-acetaminophen-caffeine (EXCEDRIN MIGRAINE) 250-250-65 MG per tablet Take 1 tablet by mouth every 6 (six) hours as needed.        . diphenhydrAMINE (BENADRYL) 25 MG tablet Take 1 tablet (25 mg total) by mouth every 6 (six) hours as needed for itching.  30 tablet  0  . fluticasone (FLONASE) 50 MCG/ACT  nasal spray Place 2 sprays into the nose daily.  16 g  6  . loratadine (CLARITIN) 10 MG tablet 1 tab po daily to bid as tolerated  30 tablet  0  . montelukast (SINGULAIR) 10 MG tablet Take 1 tablet (10 mg total) by mouth daily as needed.  90 tablet  0  . olopatadine (PATANOL) 0.1 % ophthalmic solution Place 1 drop into both eyes 2 (two) times daily as needed.      Marland Kitchen omeprazole (PRILOSEC) 20 MG capsule Take 1 capsule (20 mg total) by mouth daily.  90 capsule  1  . acyclovir (ZOVIRAX) 800 MG tablet Take 1 tablet (800 mg total) by mouth 3 (three) times daily.  21 tablet  1  . DISCONTD: diphenhydrAMINE (BENADRYL) 25 mg capsule Take 25 mg by mouth at bedtime.        No Known Allergies    ROS:  13 systems were reviewed and are notable for constant nasal congestion.  No chills or night sweats.  No significant arthralgias.  All other review of systems are unremarkable.   Examination:  Filed Vitals:   01/29/12 1026  BP: 100/70  Pulse: 72  Height: 4' 11.25" (1.505 m)  Weight: 112 lb (50.803 kg)     In general, well appearing young women.  Cardiovascular: The patient has a regular rate and rhythm and no carotid bruits.  Fundoscopy:  Disks are flat. Vessel caliber within normal limits.  Mental status:   The patient is oriented to person, place and time. Recent and remote memory are intact. Attention span and concentration are normal. Language including repetition, naming, following commands are intact. Fund of knowledge of current and historical events, as well as vocabulary are normal.  Cranial Nerves: Pupils are equally round and reactive to light. Visual fields full to confrontation.EOMs->eso deviation, worse on left gaze. Also possible right hypertropia worse on looking up to the left.  Corneals normal.  Facial sensation decreased on left. Muscles reveal mild weakness of left lower face.  T/U/P midline.  gag intact.  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   Shoulder shrug intact  Motor:  The patient has normal bulk and tone, with downward drift on left, but no orbiting.  There are no adventitious movements.  5/5 muscle strength bilaterally(although there is significant giveaway on left, but with encouragement patient can give me full strength.) Reflexes:   Biceps  Triceps Brachioradialis Knee Ankle  Right 2+  2+  2+   2+ 2+  Left  2+  2+  2+   2+ 2+  Toes down  Coordination:  Normal finger to nose.  No dysdiadokinesia.  Sensation is intact to temperature and vibration.  Gait and Station are normal.  Tandem gait is intact.  Romberg is negative  MRI brain was reviewed and appears normal.  However, the brainstem sections are quite thick and not good for appreciating the CN.   Impression/Recs: 1.  Multiple cranial neuropathies - I am concerned that this is NOT Bell's palsy.  She has signs of problems with both her CN6, CN5, CN7 and 8 on her left.  One should never get loss of sensation with Bell's palsy so this in itself makes me worried about a possible inflammatory process.  While clearly it is unlikely to be a mass lesion given a previous normal MRI that MRI was not the best study to look at the brain stem.  I am going to repeat an MRI with thin cuts through the brainstem as well as send of ANA, RF, CRP, ANCA, Lyme, serum ACE, antiRNP, Sjogren's markers.  Also, I am going to get a CT chest to look for possible sarcoidosis.  However, I suspect her vertigo, and facial pain are all tired together by one process.  I likely will have to get a CSF sample after this.   We will see the patient back in 2-3 weeks.  Thank you for having [place] see Danielle [place] in [place].  Feel free to contact me with any questions.  Lupita Raider Modesto Charon, MD [facility], [place] [address] [place], [place] [address] Phone: [phone] Fax: [phone].

## 2012-01-30 LAB — ANCA SCREEN W REFLEX TITER
Atypical p-ANCA Screen: NEGATIVE
c-ANCA Screen: NEGATIVE
p-ANCA Screen: NEGATIVE

## 2012-01-30 LAB — ANA: Anti Nuclear Antibody(ANA): NEGATIVE

## 2012-01-31 ENCOUNTER — Other Ambulatory Visit: Payer: Managed Care, Other (non HMO)

## 2012-01-31 ENCOUNTER — Other Ambulatory Visit: Payer: Self-pay

## 2012-01-31 LAB — SJOGREN'S SYNDROME ANTIBODS(SSA + SSB): SSB (La) (ENA) Antibody, IgG: 11 AU/mL (ref <30)

## 2012-01-31 NOTE — Telephone Encounter (Signed)
Left a message on patients cell to see if patient is still wanting the 90 day supply of Viorele?

## 2012-02-01 ENCOUNTER — Other Ambulatory Visit (HOSPITAL_COMMUNITY): Payer: Managed Care, Other (non HMO)

## 2012-02-01 ENCOUNTER — Telehealth: Payer: Self-pay | Admitting: Neurology

## 2012-02-01 NOTE — Telephone Encounter (Signed)
Pt would like blood work results. Okay to leave message on VM if she doesn't answer. Pt cancelled MRI and CT appointments pending results of blood work.

## 2012-02-01 NOTE — Telephone Encounter (Signed)
Danielle Sims returned my call. Information given as per Dr. Modesto Charon below. Reinforced the importance of rescheduling the tests that Dr. Modesto Charon originally ordered. She states she will. Offered to given her the numbers to call to schedule but she states she had them.

## 2012-02-01 NOTE — Telephone Encounter (Signed)
Patient states she does not want this refilled that it was only for a month

## 2012-02-01 NOTE — Telephone Encounter (Signed)
blood work normal.  she needs that MRI brain and CT chest.

## 2012-02-01 NOTE — Telephone Encounter (Signed)
Left a message for the patient to return my call.  

## 2012-02-01 NOTE — Telephone Encounter (Signed)
**  Dr. Modesto Charon, blood work looked ok (please review). She has not had the MRI with thin cuts through the brain stem as you recommended or the chest CT. Please advise testing. She is to f/u with you again in a couple of weeks.

## 2012-02-04 NOTE — Progress Notes (Signed)
Quick Note:  Patient is aware of normal labs. See phone note dated 02/01/12. ______

## 2012-02-05 ENCOUNTER — Ambulatory Visit (INDEPENDENT_AMBULATORY_CARE_PROVIDER_SITE_OTHER)
Admission: RE | Admit: 2012-02-05 | Discharge: 2012-02-05 | Disposition: A | Discharge status: Home / Self Care | Payer: Managed Care, Other (non HMO) | Source: Ambulatory Visit | Attending: Neurology | Admitting: Neurology

## 2012-02-05 DIAGNOSIS — D869 Sarcoidosis, unspecified: Secondary | ICD-10-CM

## 2012-02-05 MED ORDER — IOHEXOL 300 MG/ML  SOLN
80.0000 mL | Freq: Once | INTRAMUSCULAR | Status: AC | PRN
  Administered 2012-02-05: 80 mL via INTRAVENOUS

## 2012-02-06 ENCOUNTER — Telehealth: Payer: Self-pay | Admitting: Radiology

## 2012-02-08 ENCOUNTER — Ambulatory Visit (HOSPITAL_COMMUNITY)
Admission: RE | Admit: 2012-02-08 | Discharge: 2012-02-08 | Disposition: A | Discharge status: Home / Self Care | Payer: Managed Care, Other (non HMO) | Source: Ambulatory Visit | Attending: Neurology | Admitting: Neurology

## 2012-02-08 ENCOUNTER — Other Ambulatory Visit: Payer: Self-pay | Admitting: Neurology

## 2012-02-08 DIAGNOSIS — G527 Disorders of multiple cranial nerves: Secondary | ICD-10-CM | POA: Insufficient documentation

## 2012-02-08 MED ORDER — GADOBENATE DIMEGLUMINE 529 MG/ML IV SOLN
10.0000 mL | Freq: Once | INTRAVENOUS | Status: AC
  Administered 2012-02-08: 10 mL via INTRAVENOUS

## 2012-02-11 ENCOUNTER — Ambulatory Visit (HOSPITAL_COMMUNITY)
Admission: RE | Admit: 2012-02-11 | Discharge: 2012-02-11 | Disposition: A | Discharge status: Home / Self Care | Payer: Managed Care, Other (non HMO) | Source: Ambulatory Visit | Attending: Neurology | Admitting: Neurology

## 2012-02-11 ENCOUNTER — Encounter: Payer: Self-pay | Admitting: Neurology

## 2012-02-11 ENCOUNTER — Ambulatory Visit (INDEPENDENT_AMBULATORY_CARE_PROVIDER_SITE_OTHER): Payer: Managed Care, Other (non HMO) | Admitting: Neurology

## 2012-02-11 VITALS — BP 110/68 | HR 72 | Wt 111.0 lb

## 2012-02-11 DIAGNOSIS — H5710 Ocular pain, unspecified eye: Secondary | ICD-10-CM

## 2012-02-11 MED ORDER — GADOBENATE DIMEGLUMINE 529 MG/ML IV SOLN
10.0000 mL | Freq: Once | INTRAVENOUS | Status: AC | PRN
  Administered 2012-02-11: 10 mL via INTRAVENOUS

## 2012-02-11 NOTE — Patient Instructions (Addendum)
Go to [facility] now.  [address] [phone]  We will call you with your appointment to [facility] to see the Rheumatologist.

## 2012-02-11 NOTE — Progress Notes (Signed)
Dear Dr. Abner Greenspan,  I saw  Danielle Sims back in [facility] clinic for her problem with left atypical facial pain, recurrent left facial paresis and vergtigo.  As you may recall, she is a 31 y.o. year old female with a history of erythema nodosum who has had atypical facial pain for several years, and now has had two recurrent bouts of left facial paresis.  At her last visit, I was worried about primarily a rheumatologic or possibly infectious cause of  her multiple cranial neuropathies.  However, CRP, ANA, Lyme, ANCA, Sjogren's, ACE, HIV and RNP ab were normal.  CT chest was also unremarkable for signs of sarcoid, as well as an MRI brain with and without contrast with thin cuts through the brain stem.  However, she returns urgently today after developing conjunctival injection and periorbital swelling in her right eye.  She has had tearing of the eye, but no discharge.  There is pain with eye movement.   Medical history, social history, and family history were reviewed and have not changed since the last clinic visit.  Current Outpatient Prescriptions on File Prior to Visit  Medication Sig Dispense Refill  . acyclovir (ZOVIRAX) 800 MG tablet Take 1 tablet (800 mg total) by mouth 3 (three) times daily.  21 tablet  1  . ALPRAZolam (XANAX) 0.5 MG tablet as needed.      Marland Kitchen aspirin-acetaminophen-caffeine (EXCEDRIN MIGRAINE) 250-250-65 MG per tablet Take 1 tablet by mouth every 6 (six) hours as needed.        . diphenhydrAMINE (BENADRYL) 25 MG tablet Take 1 tablet (25 mg total) by mouth every 6 (six) hours as needed for itching.  30 tablet  0  . fluticasone (FLONASE) 50 MCG/ACT nasal spray Place 2 sprays into the nose daily.  16 g  6  . loratadine (CLARITIN) 10 MG tablet 1 tab po daily to bid as tolerated  30 tablet  0  . montelukast (SINGULAIR) 10 MG tablet Take 1 tablet (10 mg total) by mouth daily as needed.  90 tablet  0  . olopatadine (PATANOL) 0.1 % ophthalmic solution Place 1 drop into both eyes 2  (two) times daily as needed.      Marland Kitchen omeprazole (PRILOSEC) 20 MG capsule Take 1 capsule (20 mg total) by mouth daily.  90 capsule  1  . OXcarbazepine (TRILEPTAL) 150 MG tablet Take 150 mg by mouth as needed.        Allergies  Allergen Reactions  . Iodinated Diagnostic Agents Hives    Pt developed 2 hives around site of injection Lt antecubital that were itchy after leaving lobby post scan. Given 50 mg oral benadryl.     ROS:  13 systems were reviewed and are notable for a history of erythema nodosum.  All other review of systems are unremarkable.  Exam: . Filed Vitals:   02/11/12 0918  BP: 110/68  Pulse: 72  Weight: 111 lb (50.349 kg)    In general, thin appearing women in some distress.  Mental status:   The patient is oriented to person, place and time. Recent and remote memory are intact. Attention span and concentration are normal. Language including repetition, naming, following commands are intact. Fund of knowledge of current and historical events, as well as vocabulary are normal.  Cranial Nerves: Pupils are equally round and reactive to light. With photophobia in right eye.  Extraocular movements are intact without nystagmus(although she endorses pain behind right eye with eye movement) Facial sensation decreased on right  face. Muscles of facial expression reveal left lower facial droop.  Hearing decreased in left ear. Tongue deviation to right side.  Shoulder shrug intact. ? mild proptosis of left eye.  Motor:  Normal bulk and tone, no drift and 5/5 muscle strength on right, giveaway 4/5 strength on left.  Reflexes:  3+ thoughout, toes down.  Coordination:  Normal finger to nose  Gait:  Normal gait and station.  Romberg negative.  Impression/Recommendations:  1.  Multiple cranial neuropathies on left side, now with periorbital swelling on right and injection of right eye and pain with eye movement.  I am concerned about a possible auto-immune cause of her right eye  swelling - perhaps a uveitis. I am going to see if we can get her into see Dr. Dione Booze asap to get a good slit lamp examination.  I think she needs a LP and possibly an MRI of her orbits(although not sure this will add much -- I will rely on her eye exam in this respect).  Finally, I want to set her up to see Dr. Dierdre Forth or Dareen Piano in rheumatology to see if they think she has an obvious autoimmune disease.  However, I suspect we are going to end up putting her on long term immune suppression after the work up is complete.  We will see the patient back in 2 weeks.  Lupita Raider Modesto Charon, MD [facility], [place]

## 2012-02-12 ENCOUNTER — Ambulatory Visit: Payer: Managed Care, Other (non HMO) | Admitting: Neurology

## 2012-02-12 ENCOUNTER — Telehealth: Payer: Self-pay | Admitting: Neurology

## 2012-02-12 NOTE — Telephone Encounter (Signed)
Jan - let them know MRI was normal.  They need to f/u with ophthalmology on Wednesday to be re-examined and we need to proceed to LP if they are in agreement.  Does she need anything for pain at this time? I don't want to start her on steroids yet, as this may change the results.  If she needs something for pain Vicodin 5/500 q6h prn 60 tabs no refills can be prescribed.

## 2012-02-12 NOTE — Telephone Encounter (Signed)
Pt called for results of MRI done 02/11/2012 @ 4:45 pm.

## 2012-02-12 NOTE — Telephone Encounter (Signed)
Spoke with the patient. Information given as per Dr. Modesto Charon below. The patient declined the need for pain medication stating the drops she got from Dr. Dione Booze yesterday were helping with the pain. She is also taking OTC Ibuprofen as he recommended as well. She does not want to do the LP at this time as she wants to see the rheumatologist first and get their opinion. That referral is in process. We are waiting to hear form them re: appointment. I went ahead and scheduled the 2 week f/u appointment with Dr. Modesto Charon as states in the office note. Advised to call if questions or concerns. She is fine with this plan **Dr. Modesto Charon, FYI only....Marland KitchenMarland Kitchen

## 2012-02-14 ENCOUNTER — Telehealth: Payer: Self-pay | Admitting: Neurology

## 2012-02-14 NOTE — Telephone Encounter (Signed)
Spoke with pt and gave her the appt info for Rheum.  8/29 at 9:00am with Dr. Dareen Piano at [facility]. She also asked if she started the treatment that was discussed at her appt would it affect her lumbar puncture; which she is considering since the Rhem appt is not until Thursday after next.  Also, she went back to the opthomologist this morning who found something additional and they will be calling today.

## 2012-02-14 NOTE — Telephone Encounter (Signed)
Pt's husband called to check on referral to rheumatology. They haven't heard anything about an appointment yet and the pt is reporting loss of vision in her right eye. Husband also claims that Dr. Dione Booze has been unable to reach Dr. Modesto Charon to discuss pt. Dr. Laruth Bouchard office called yesterday on Dr. Nash Dimmer day off and they were given his cell phone number. Dr. Dione Booze is supposed to call our office again today to try to report on pt. Can we find out what's happening with the rheumatology appointment?

## 2012-02-15 ENCOUNTER — Other Ambulatory Visit: Payer: Self-pay | Admitting: Neurology

## 2012-02-15 DIAGNOSIS — H5710 Ocular pain, unspecified eye: Secondary | ICD-10-CM

## 2012-02-15 DIAGNOSIS — H469 Unspecified optic neuritis: Secondary | ICD-10-CM

## 2012-02-15 NOTE — Telephone Encounter (Signed)
Spoke to [place].  Pain in eye slightly better, vision unchanged.  Spoke to Dr. Dione Booze.  She had a superior nasal deficit on VF testing that is new.  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  Danielle Sims has agreed to LP, Jan to setup for Tuesday afternoon or Wednesday afternoon.  I will then likely start on high dose steroids.  Jan - I need to add on additionally oligoclonal bands on the CSF and IgG index as well.  With these they have to draw blood around the same time.

## 2012-02-18 NOTE — Telephone Encounter (Signed)
LP scheduled for Tuesday at [facility]. The patient as well as radiology called with the details.

## 2012-02-19 ENCOUNTER — Inpatient Hospital Stay (HOSPITAL_COMMUNITY)
Admission: RE | Admit: 2012-02-19 | Discharge: 2012-02-19 | Disposition: A | Discharge status: Home / Self Care | Payer: Managed Care, Other (non HMO) | Source: Ambulatory Visit

## 2012-02-19 ENCOUNTER — Encounter (HOSPITAL_COMMUNITY): Payer: Self-pay | Admitting: Pharmacy Technician

## 2012-02-19 ENCOUNTER — Ambulatory Visit (HOSPITAL_COMMUNITY)
Admission: RE | Admit: 2012-02-19 | Discharge: 2012-02-19 | Disposition: A | Discharge status: Home / Self Care | Payer: Managed Care, Other (non HMO) | Source: Ambulatory Visit | Attending: Neurology | Admitting: Neurology

## 2012-02-19 DIAGNOSIS — H5710 Ocular pain, unspecified eye: Secondary | ICD-10-CM

## 2012-02-19 DIAGNOSIS — H469 Unspecified optic neuritis: Secondary | ICD-10-CM | POA: Insufficient documentation

## 2012-02-19 LAB — CSF CELL COUNT WITH DIFFERENTIAL
RBC Count, CSF: 1 /mm3 — ABNORMAL HIGH
Tube #: 1
WBC, CSF: 1 /mm3 (ref 0–5)
WBC, CSF: 2 /mm3 (ref 0–5)

## 2012-02-19 LAB — GRAM STAIN: Gram Stain: NONE SEEN

## 2012-02-19 LAB — CRYPTOCOCCAL ANTIGEN, CSF: Crypto Ag: NEGATIVE

## 2012-02-19 MED ORDER — ACETAMINOPHEN 500 MG PO TABS
1000.0000 mg | ORAL_TABLET | Freq: Four times a day (QID) | ORAL | Status: DC | PRN
  Administered 2012-02-19: 1000 mg via ORAL
  Filled 2012-02-19 (×2): qty 2

## 2012-02-19 NOTE — Procedures (Signed)
LP performed at the L4/5 level with fluoro guidance. Opening Pressure 17 cm/h20. 12 cc clear csf collected.

## 2012-02-20 ENCOUNTER — Telehealth: Payer: Self-pay | Admitting: Neurology

## 2012-02-20 ENCOUNTER — Inpatient Hospital Stay (HOSPITAL_COMMUNITY): Admission: RE | Admit: 2012-02-20 | Payer: Managed Care, Other (non HMO) | Source: Ambulatory Visit

## 2012-02-20 LAB — VARICELLA ZOSTER ANTIBODY, IGG: Varicella IgG: 2.36 {ISR} — ABNORMAL HIGH

## 2012-02-20 NOTE — Telephone Encounter (Signed)
Message copied by Benay Spice on Wed Feb 20, 2012 12:28 PM ------      Message from: Denton Meek H      Created: Wed Feb 20, 2012 10:54 AM       Let Ms. Danielle Sims know that her preliminary CSF results did not show any sign of infection or inflammation.  Still some results to come back which can take over a week, but overall looks good.            How is her eye doing?

## 2012-02-20 NOTE — Telephone Encounter (Signed)
Spoke with [place], patient's spouse. Information given as per Dr. Modesto Charon below. He reports that her eye is "holding steady and maybe shows a slight improvement" with the eye drops and anti-inflammatories. He wants to know what Dr. Nash Dimmer treatment plan includes for [facility]. I let him know that he was out of the office today and that I would let him know that we had spoken. He is ok with this plan. **Dr. Modesto Charon, please advise treatment plan?

## 2012-02-21 ENCOUNTER — Encounter: Payer: Self-pay | Admitting: Neurology

## 2012-02-21 ENCOUNTER — Telehealth: Payer: Self-pay | Admitting: Neurology

## 2012-02-21 ENCOUNTER — Ambulatory Visit (INDEPENDENT_AMBULATORY_CARE_PROVIDER_SITE_OTHER): Payer: Managed Care, Other (non HMO) | Admitting: Neurology

## 2012-02-21 VITALS — BP 110/70 | HR 76

## 2012-02-21 DIAGNOSIS — G527 Disorders of multiple cranial nerves: Secondary | ICD-10-CM

## 2012-02-21 LAB — ANGIOTENSIN CONVERTING ENZYME, CSF: Angio Convert Enzyme: 3 U/L (ref <=15)

## 2012-02-21 LAB — HERPES SIMPLEX VIRUS(HSV) DNA BY PCR: HSV 1 DNA: NOT DETECTED

## 2012-02-21 MED ORDER — PREDNISONE 10 MG PO TABS: 40.0000 mg | ORAL_TABLET | Freq: Every day | ORAL | Status: DC

## 2012-02-21 NOTE — Progress Notes (Signed)
Dear Dr. 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  As you may recall, she is a 31 y.o. year old female with a history of erythema nodosum who has had a 5 year history of "atypical facial pain" involving her left face as well as two cases of left facial nerve paresis as well as problems with vertigo.  Most recently she developed pain behind her left eye and a superior nasal vf cut in her right eye suggestive of optic nerve pathology.  She also has non-specific cotton wool spots in her left eye.  I have been suspicious of an inflammatory disease affecting her multiple cranial nerves.  MRI brain was unremarkable, as was initial results from CSF analysis( 0 WBCs, normal protein, gram, fungal and afb stain negative, VDRL -, cultures pending, ACE pending).  She says her vision has been stable but she did not return to see Dr. Dione Booze for surveillance of her VF cut.  Her right eye pain has improved somewhat.  She had her LP on Monday, but yesterday began having bad back pain and leg pain shooting down her right leg.  She said that during the procedure they did "hit a nerve".  However, after the procedure she had relatively little back or leg pain.  She denies any trauma.  She denies bladder problems.  She denies abnormal perineal sensation.  Medical history, social history, and family history were reviewed and have not changed since the last clinic visit.  Current Outpatient Prescriptions on File Prior to Visit  Medication Sig Dispense Refill  . acyclovir (ZOVIRAX) 800 MG tablet Take 800 mg by mouth 3 (three) times daily as needed. For bells palsy      . aspirin-acetaminophen-caffeine (EXCEDRIN MIGRAINE) 250-250-65 MG per tablet Take 1 tablet by mouth every 6 (six) hours as needed. For migraine      . Cyanocobalamin (VITAMIN B 12) 100 MCG LOZG Take 2,000 mcg by mouth daily.      . diphenhydrAMINE (BENADRYL)  25 MG tablet Take 25 mg by mouth at bedtime as needed. For allergies      . fluticasone (FLONASE) 50 MCG/ACT nasal spray Place 2 sprays into the nose daily. For allergies      . ibuprofen (ADVIL,MOTRIN) 200 MG tablet Take 200-300 mg by mouth every 6 (six) hours as needed. For pain      . loratadine (CLARITIN) 10 MG tablet Take 10 mg by mouth daily as needed. For allergies      . montelukast (SINGULAIR) 10 MG tablet Take 10 mg by mouth daily as needed. For alleriges      . nepafenac (NEVANAC) 0.1 % ophthalmic suspension Place 1 drop into both eyes 3 (three) times daily.      Marland Kitchen olopatadine (PATANOL) 0.1 % ophthalmic solution Place 1 drop into both eyes daily as needed. For allergies      . omeprazole (PRILOSEC) 20 MG capsule Take 1 capsule (20 mg total) by mouth daily.  90 capsule  1  . predniSONE (DELTASONE) 20 MG tablet Take 20 mg by mouth daily as needed. For bells palsy        Allergies  Allergen Reactions  . Iodinated Diagnostic Agents Hives    Pt developed 2 hives around site of injection Lt antecubital that were itchy after leaving lobby post scan. Given 50 mg oral benadryl.     ROS:  13 systems were reviewed and  are notable for no bowel or bladder problems.  All other review of systems are unremarkable.  Exam: . Filed Vitals:   02/21/12 1432  BP: 110/70  Pulse: 76    In general, looks in pain.  Mental status:   The patient is oriented to person, place and time. Recent and remote memory are intact. Attention span and concentration are normal. Language including repetition, naming, following commands are intact. Fund of knowledge of current and historical events, as well as vocabulary are normal.  Cranial Nerves: Pupils are equally round and reactive to light(no APD) VF blurry in right eye, nasal upper. Extraocular movements are intact without nystagmus. Facial sensation decreased on left. Left lower facial drop. Hearing intact to bilateral finger rub. Tongue protrusion, uvula,  palate midline.  Shoulder shrug intact  Motor:  Normal bulk and tone, no drift and 5/5 muscle strength bilaterally in UE.  In lower extremity, 4+/5 in right LE, and 4/5 LLE, although inconsistent.  Reflexes:  2+ thoughout, toes down.  Coordination:  Normal finger to nose  Gait:  Antalgic gait and station.  Normal sensation in LE.   Impression/Recommendations:  1.  Back pain post LP - I think this is benign.  However, if it continues to get worse or persists more than 5 days we will have to consider a repeat MRI.  Of course, if she develops bladder problems or new weakness then we may need to scan her earlier.  She will use ibuprofen prn for her back pain. 2.  Optic neuritis, cranial neuropathies - Given her benign tap I am going to start her on prednisone 40mg  daily.  I would like her to follow up with Dr. Dione Booze in 2 weeks to see how her VF cut is doing.  I may consider VEPs but will wait until I can get reliable ones.  She will return in 3 weeks.  Lupita Raider Modesto Charon, MD [facility], [place]

## 2012-02-21 NOTE — Telephone Encounter (Signed)
Called and spoke with the patient at about 1:45 pm. Per Dr. Modesto Charon, the patient will come in at 3:00 pm today for f/u post LP and to discuss treatment options.

## 2012-02-21 NOTE — Telephone Encounter (Signed)
Pt's husband reports that 2 days post LP, pt is experiencing a lot of pain and cannot walk. He would like someone to call him immediately.

## 2012-02-22 LAB — VARICELLA-ZOSTER BY PCR: Varicella-Zoster, PCR: NOT DETECTED

## 2012-02-23 LAB — CSF CULTURE W GRAM STAIN

## 2012-02-28 LAB — CSF IGG: IgG, CSF: 2 mg/dL (ref 0.8–7.7)

## 2012-03-07 ENCOUNTER — Ambulatory Visit: Payer: Managed Care, Other (non HMO) | Admitting: Neurology

## 2012-03-14 ENCOUNTER — Ambulatory Visit: Payer: Managed Care, Other (non HMO) | Admitting: Neurology

## 2012-03-17 LAB — FUNGUS CULTURE W SMEAR

## 2012-04-02 LAB — AFB CULTURE WITH SMEAR (NOT AT ARMC): Acid Fast Smear: NONE SEEN

## 2012-04-03 ENCOUNTER — Telehealth: Payer: Self-pay | Admitting: Neurology

## 2012-04-03 NOTE — Telephone Encounter (Signed)
Pt's husband called very upset.Wife was referred to rheumatologist per Dr. Modesto Charon, but husband states that rheumatologist refuses to see pt unless she has a lip biopsy done. He claims that Dr. Nash Dimmer diagnosis was incorrect and will not treat pt. Husband is frustrated now and does not know what to do. He would like to be referred to a different rheumatologist for a second opinion. Aware that Dr. Modesto Charon is no longer practicing with [facility]. Please advise.

## 2012-04-04 NOTE — Telephone Encounter (Signed)
Left a message for Mr. Matsuoka to return my call.

## 2012-04-08 NOTE — Telephone Encounter (Signed)
The patient's husband never returned my call.

## 2012-05-22 ENCOUNTER — Ambulatory Visit (INDEPENDENT_AMBULATORY_CARE_PROVIDER_SITE_OTHER): Payer: Managed Care, Other (non HMO) | Admitting: Family Medicine

## 2012-05-22 ENCOUNTER — Encounter: Payer: Self-pay | Admitting: Family Medicine

## 2012-05-22 VITALS — BP 103/64 | HR 71 | Temp 99.0°F | Wt 113.0 lb

## 2012-05-22 DIAGNOSIS — J019 Acute sinusitis, unspecified: Secondary | ICD-10-CM

## 2012-05-22 MED ORDER — CEFUROXIME AXETIL 500 MG PO TABS: ORAL_TABLET | ORAL | Status: DC

## 2012-05-22 NOTE — Patient Instructions (Signed)
Buy OTC saline nasal spray and use 2-3 times per day. Restart your flonase once daily. Continue singulair daily and benadryl as needed.

## 2012-05-22 NOTE — Progress Notes (Signed)
OFFICE NOTE  05/22/2012  CC:  Chief Complaint  Patient presents with  . Sinusitis    sinus HA and pressure x 2 weeks, nasal congestion, unknown fever     HPI: Patient is a 31 y.o. Caucasian female who is here for respiratory complaints. Pt presents complaining of respiratory symptoms for 14 days.  Primary symptoms are: nasal congestion/runny nose, sinus/nasal pressure, retro-orbital HA, ST and PND, starting to have cough.  Lately the symptoms seem to be worsening.  NO fever.  No wheezing or SOB. +pain in face or teeth.  Nasal mucous thick/yellowish green. Symptoms made worse by night-time.  Symptoms improved by nothing. Smoker? no Recent sick contact? Yes--works at [employer] office Muscle or joint aches? Yes, mild and diffuse Flu shot this season at least 2 wks ago? Not yet  Additional ROS: no n/v/d or abdominal pain.  No rash.  No neck stiffness.   +Mild fatigue.  +Mild appetite loss.   Pertinent PMH:  Past Medical History  Diagnosis Date  . Migraines   . Bronchitis, acute 01/18/2011  . Gastroenteritis 05/31/2011  . Suprapubic pain 05/31/2011  . Cervical cancer screening 07/12/2011  . Rectal bleeding 07/13/2011  . Headache 07/13/2011  . Allergic state 07/13/2011  . Bronchitis 07/16/2011  . LUQ pain 08/27/2011  . Anxiety attack 11/13/2011  . History of motion sickness 11/29/2011  . Chest pain, atypical 11/29/2011  . Folliculitis 11/29/2011  . 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 Urticaria 01/11/2012    MEDS:  Outpatient Prescriptions Prior to Visit  Medication Sig Dispense Refill  . aspirin-acetaminophen-caffeine (EXCEDRIN MIGRAINE) 250-250-65 MG per tablet Take 1 tablet by mouth every 6 (six) hours as needed. For migraine      . diphenhydrAMINE (BENADRYL) 25 MG tablet Take 25 mg by mouth at bedtime as needed. For allergies      . montelukast (SINGULAIR) 10 MG tablet Take 10 mg by mouth daily as needed. For alleriges      . omeprazole (PRILOSEC) 20 MG capsule Take 1 capsule (20 mg total)  by mouth daily.  90 capsule  1  . acyclovir (ZOVIRAX) 800 MG tablet Take 800 mg by mouth 3 (three) times daily as needed. For bells palsy      . Cyanocobalamin (VITAMIN B 12) 100 MCG LOZG Take 2,000 mcg by mouth daily.      . fluticasone (FLONASE) 50 MCG/ACT nasal spray Place 2 sprays into the nose daily. For allergies      . ibuprofen (ADVIL,MOTRIN) 200 MG tablet Take 200-300 mg by mouth every 6 (six) hours as needed. For pain      . loratadine (CLARITIN) 10 MG tablet Take 10 mg by mouth daily as needed. For allergies      . nepafenac (NEVANAC) 0.1 % ophthalmic suspension Place 1 drop into both eyes 3 (three) times daily.      Marland Kitchen olopatadine (PATANOL) 0.1 % ophthalmic solution Place 1 drop into both eyes daily as needed. For allergies      . [DISCONTINUED] predniSONE (DELTASONE) 10 MG tablet Take 4 tablets (40 mg total) by mouth daily.  120 tablet  3  . [DISCONTINUED] predniSONE (DELTASONE) 20 MG tablet Take 20 mg by mouth daily as needed. For bells palsy       Last reviewed on 05/22/2012  2:18 PM by Luisa Dago, CMA  PE: Blood pressure 103/64, pulse 71, temperature 99 F (37.2 C), temperature source Temporal, weight 113 lb (51.256 kg), SpO2 100.00%. VS: noted--normal. Gen: alert, NAD, NONTOXIC but  tired- APPEARING. HEENT: eyes without injection, drainage, or swelling.  Ears: EACs clear, TMs with normal light reflex and landmarks.  Nose: Clear rhinorrhea, with some dried, crusty exudate adherent to mildly injected mucosa.  No purulent d/c.  Mild aranasal sinus TTP.  No facial swelling.  Throat and mouth without focal lesion.  No pharyngial swelling, erythema, or exudate.   Neck: supple, mild anterior cervical LAD diffusely.   LUNGS: CTA bilat, nonlabored resps.   CV: RRR, no m/r/g. EXT: no c/c/e SKIN: no rash  LAB: none today  IMPRESSION AND PLAN:  Sinusitis, acute Ceftin 500mg  bid x 10d. Flonase 2 sprays once daily--restart. Start saline nasal spray daily. Continue  singulair and benadryl as she is currently doing.   An After Visit Summary was printed and given to the patient.  FOLLOW UP: prn

## 2012-05-22 NOTE — Assessment & Plan Note (Signed)
Ceftin 500mg  bid x 10d. Flonase 2 sprays once daily--restart. Start saline nasal spray daily. Continue singulair and benadryl as she is currently doing.

## 2012-12-16 ENCOUNTER — Encounter: Payer: Managed Care, Other (non HMO) | Admitting: Family Medicine

## 2012-12-23 ENCOUNTER — Encounter: Payer: Self-pay | Admitting: Internal Medicine

## 2012-12-24 ENCOUNTER — Ambulatory Visit (HOSPITAL_COMMUNITY)
Admission: RE | Admit: 2012-12-24 | Discharge: 2012-12-24 | Disposition: A | Discharge status: Home / Self Care | Payer: Managed Care, Other (non HMO) | Source: Ambulatory Visit | Attending: Family Medicine | Admitting: Family Medicine

## 2012-12-24 ENCOUNTER — Other Ambulatory Visit (HOSPITAL_COMMUNITY): Payer: Managed Care, Other (non HMO)

## 2012-12-24 ENCOUNTER — Encounter: Payer: Self-pay | Admitting: Family Medicine

## 2012-12-24 ENCOUNTER — Ambulatory Visit: Payer: Managed Care, Other (non HMO) | Admitting: Internal Medicine

## 2012-12-24 ENCOUNTER — Ambulatory Visit (INDEPENDENT_AMBULATORY_CARE_PROVIDER_SITE_OTHER): Payer: Managed Care, Other (non HMO) | Admitting: Family Medicine

## 2012-12-24 VITALS — BP 100/74 | HR 84 | Temp 98.0°F

## 2012-12-24 DIAGNOSIS — I862 Pelvic varices: Secondary | ICD-10-CM | POA: Insufficient documentation

## 2012-12-24 DIAGNOSIS — R112 Nausea with vomiting, unspecified: Secondary | ICD-10-CM

## 2012-12-24 DIAGNOSIS — N949 Unspecified condition associated with female genital organs and menstrual cycle: Secondary | ICD-10-CM | POA: Insufficient documentation

## 2012-12-24 DIAGNOSIS — R109 Unspecified abdominal pain: Secondary | ICD-10-CM

## 2012-12-24 DIAGNOSIS — N760 Acute vaginitis: Secondary | ICD-10-CM | POA: Insufficient documentation

## 2012-12-24 DIAGNOSIS — N854 Malposition of uterus: Secondary | ICD-10-CM | POA: Insufficient documentation

## 2012-12-24 DIAGNOSIS — Z113 Encounter for screening for infections with a predominantly sexual mode of transmission: Secondary | ICD-10-CM | POA: Insufficient documentation

## 2012-12-24 LAB — URINALYSIS, ROUTINE W REFLEX MICROSCOPIC
Bilirubin Urine: NEGATIVE
Hgb urine dipstick: NEGATIVE
Nitrite: NEGATIVE
Total Protein, Urine: NEGATIVE
Urine Glucose: NEGATIVE
pH: 6 (ref 5.0–8.0)

## 2012-12-24 LAB — COMPREHENSIVE METABOLIC PANEL
AST: 17 U/L (ref 0–37)
Albumin: 4 g/dL (ref 3.5–5.2)
Alkaline Phosphatase: 59 U/L (ref 39–117)
Potassium: 3.7 mEq/L (ref 3.5–5.1)
Sodium: 134 mEq/L — ABNORMAL LOW (ref 135–145)
Total Bilirubin: 0.4 mg/dL (ref 0.3–1.2)
Total Protein: 7.8 g/dL (ref 6.0–8.3)

## 2012-12-24 LAB — CBC WITH DIFFERENTIAL/PLATELET
Eosinophils Absolute: 0.1 10*3/uL (ref 0.0–0.7)
Lymphs Abs: 2.2 10*3/uL (ref 0.7–4.0)
MCHC: 32.6 g/dL (ref 30.0–36.0)
MCV: 91.2 fl (ref 78.0–100.0)
Monocytes Absolute: 0.6 10*3/uL (ref 0.1–1.0)
Neutrophils Relative %: 69.7 % (ref 43.0–77.0)
Platelets: 352 10*3/uL (ref 150.0–400.0)
RDW: 14.8 % — ABNORMAL HIGH (ref 11.5–14.6)
WBC: 9.9 10*3/uL (ref 4.5–10.5)

## 2012-12-24 MED ORDER — ONDANSETRON 4 MG PO TBDP: 4.0000 mg | ORAL_TABLET | Freq: Three times a day (TID) | ORAL | Status: DC | PRN

## 2012-12-24 NOTE — Addendum Note (Signed)
Addended by: Azucena Freed on: 12/24/2012 11:47 AM   Modules accepted: Orders

## 2012-12-24 NOTE — Patient Instructions (Addendum)
-  use zofran as needed per instructions for nausea and vomiting  -get [place] today  -We have ordered labs or studies at this visit. It can take up to 1-2 weeks for results and processing. We will contact you with instructions IF your results are abnormal. Normal results will be released to your [facility]. If you have not heard from [place] or can not find your results in [employer] in 2 weeks please contact our office.  -go to the emergency room if worsening or not improving  -follow up with your doctor tomorrow

## 2012-12-24 NOTE — Addendum Note (Signed)
Addended by: Terressa Koyanagi on: 12/24/2012 11:48 AM   Modules accepted: Orders, Level of Service

## 2012-12-24 NOTE — Progress Notes (Addendum)
Chief Complaint  Patient presents with  . pain on right side    radiates down leg; diarrhea and vomiting off and on, nausea    HPI:  Acute visit for abd pain, nausea and vomiting: -32 yo F patient of Dr. 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  Past Medical History  Diagnosis Date  . Migraines   . Bronchitis, acute 01/18/2011  . Gastroenteritis 05/31/2011  . Suprapubic pain 05/31/2011  . Cervical cancer screening 07/12/2011  . Rectal bleeding 07/13/2011  . Headache(784.0) 07/13/2011  . Allergic state 07/13/2011  . Bronchitis 07/16/2011  . LUQ pain 08/27/2011  . Anxiety attack 11/13/2011  . History of motion sickness 11/29/2011  . Chest pain, atypical 11/29/2011  . Folliculitis 11/29/2011  . 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 Urticaria 01/11/2012    Family History  Problem Relation Age of Onset  . Stroke Mother   . Heart disease Mother   . Hyperlipidemia Mother   . Rheum arthritis Mother   . Fibroids Mother     Uterine  . Fibrocystic breast disease Mother   . Hyperlipidemia Maternal Grandmother   . Osteoarthritis Maternal Grandmother   . Glaucoma  Maternal Grandmother   . Cancer Maternal Grandfather     Skin, prostate, throat, stomach    History   Social History  . Marital Status: Married    Spouse Name: N/A    Number of Children: N/A  . Years of Education: N/A   Social History Main Topics  . Smoking status: Never Smoker   . Smokeless tobacco: Never Used  . Alcohol Use: 0.0 oz/week    0 drink(s) per week     Comment: ocassional  . Drug Use: No  . Sexually Active: Yes -- Female partner(s)   Other Topics Concern  . None   Social History Narrative  . None    Current outpatient prescriptions:acyclovir (ZOVIRAX) 800 MG tablet, Take 800 mg by mouth 3 (three) times daily as needed. For bells palsy, Disp: , Rfl: ;  aspirin-acetaminophen-caffeine (EXCEDRIN MIGRAINE) 250-250-65 MG per tablet, Take 1 tablet by mouth every 6 (six) hours as needed. For migraine, Disp: , Rfl: ;  Cyanocobalamin (VITAMIN B 12) 100 MCG LOZG, Take 2,000 mcg by mouth daily., Disp: , Rfl:  diphenhydrAMINE (BENADRYL) 25 MG tablet, Take 25 mg by mouth at bedtime as needed. For allergies, Disp: , Rfl: ;  ibuprofen (ADVIL,MOTRIN) 200 MG tablet, Take 200-300 mg by mouth every 6 (six) hours as needed. For pain, Disp: , Rfl: ;  loratadine (CLARITIN) 10 MG tablet, Take 10 mg by mouth daily as needed. For allergies, Disp: , Rfl: ;  montelukast (SINGULAIR) 10 MG tablet, Take 10  mg by mouth daily as needed. For alleriges, Disp: , Rfl:  nepafenac (NEVANAC) 0.1 % ophthalmic suspension, Place 1 drop into both eyes 3 (three) times daily., Disp: , Rfl: ;  olopatadine (PATANOL) 0.1 % ophthalmic solution, Place 1 drop into both eyes daily as needed. For allergies, Disp: , Rfl: ;  cefUROXime (CEFTIN) 500 MG tablet, 1 tab po bid x 10d, Disp: 20 tablet, Rfl: 0;  fluticasone (FLONASE) 50 MCG/ACT nasal spray, Place 2 sprays into the nose daily. For allergies, Disp: , Rfl:  omeprazole (PRILOSEC) 20 MG capsule, Take 1 capsule (20 mg total) by mouth daily., Disp: 90 capsule, Rfl: 1;   ondansetron (ZOFRAN ODT) 4 MG disintegrating tablet, Take 1 tablet (4 mg total) by mouth every 8 (eight) hours as needed for nausea., Disp: 20 tablet, Rfl: 0  EXAM:  Filed Vitals:   12/24/12 1000  BP: 100/74  Pulse: 84  Temp: 98 F (36.7 C)    There is no weight on file to calculate BMI.  GENERAL: vitals reviewed and listed above, alert, oriented, appears well hydrated, walks hunched over  HEENT: atraumatic, conjunttiva clear, no obvious abnormalities on inspection of external nose and ears  NECK: no obvious masses on inspection  LUNGS: clear to auscultation bilaterally, no wheezes, rales or rhonchi, good air movement  CV: HRRR, no peripheral edema  ABD: BS+, soft, diffuse TTP worse in upper RLQ - pt reports some rebound, and is guarded during exam - no rigidity or palpable masses, no CVA TTP  GU: on pelvic exam - normal appearance of external genitalia, vagina and cervic, no purulent dicharge, ovaries and uterus NTTP   MS: moves all extremities without noticeable abnormality  PSYCH: pleasant and cooperative, tearful  ASSESSMENT AND PLAN:  Discussed the following assessment and plan:  Abdominal  pain, other specified site - Plan: CBC with Differential, CMP, Urinalysis with Reflex Microscopic, POCT urine pregnancy, Lipase, CT Abdomen Pelvis W Contrast, CT Abdomen Pelvis Wo Contrast, Cytology - PAP, US Abdomen Complete, US Pelvis Complete  Nausea and vomiting - Plan: ondansetron (ZOFRAN ODT) 4 MG disintegrating tablet, US Abdomen Complete, US Pelvis Complete, GC/Chlam, wet prep  -given severity of pain advised work up in ED which patient refused - query gastroenteritis - feel ovarian pathology less likely given exam findings, but advised imaging to exclude -pt has IV contrast allergy - discussed outpt premedicated CT tomorrow vs [place], CT stone study if urine suggest kidney stones today -pt prefers labs and [place] - ordered STAT  advised if worsening symptoms, unable to tolerate PO  intake she needs go to ED for evaluation -if no ovarian pathology  or tests/us unrevealing and feeling better should see GI for chronic GI issues -she refuses pain medication and nuasea tx here. ODT zofran provided. -Patient advised to return or notify a doctor immediately if symptoms worsen or persist or new concerns arise. >45 minutes spent with this patient - she felt better when left and reported pain was down from 10/10 to a 4/10  -advised follow up tomorrow with PCP office  There are no Patient Instructions on file for this visit.   Kriste Basque R.

## 2012-12-25 ENCOUNTER — Ambulatory Visit: Payer: Managed Care, Other (non HMO)

## 2012-12-25 ENCOUNTER — Other Ambulatory Visit (HOSPITAL_COMMUNITY): Payer: Managed Care, Other (non HMO)

## 2012-12-25 ENCOUNTER — Other Ambulatory Visit (HOSPITAL_COMMUNITY)
Admission: RE | Admit: 2012-12-25 | Discharge: 2012-12-25 | Disposition: A | Discharge status: Home / Self Care | Payer: Managed Care, Other (non HMO) | Source: Ambulatory Visit | Attending: Family Medicine | Admitting: Family Medicine

## 2012-12-25 ENCOUNTER — Telehealth: Payer: Self-pay

## 2012-12-25 ENCOUNTER — Telehealth: Payer: Self-pay | Admitting: Family Medicine

## 2012-12-25 NOTE — Telephone Encounter (Signed)
Discussed lab results with pt. Advised of gonorrhea. Advised or serious complications of this infection, importance of tx of partner, treatment and risks. Transferred to [facility] to schedule nurse visit fo rocephin inj. She currently is feeling better and symptoms resolving. she had no cervical discharge or CMT on exam so it is unlike this was the cause of prior symptoms. Advised if any further symptoms, pelvic pain, etc to see a gynecologist.

## 2012-12-25 NOTE — Telephone Encounter (Signed)
Pt came in to office and declined to get rocephin injection at this time.  Pt would like Dr. Selena Batten to call her due to having questions.  Pt would like to be retested.  Pls advise.

## 2012-12-26 ENCOUNTER — Ambulatory Visit (INDEPENDENT_AMBULATORY_CARE_PROVIDER_SITE_OTHER): Payer: Managed Care, Other (non HMO) | Admitting: Family Medicine

## 2012-12-26 ENCOUNTER — Ambulatory Visit: Payer: Managed Care, Other (non HMO) | Admitting: Family Medicine

## 2012-12-26 ENCOUNTER — Ambulatory Visit: Payer: Managed Care, Other (non HMO)

## 2012-12-26 DIAGNOSIS — A549 Gonococcal infection, unspecified: Secondary | ICD-10-CM

## 2012-12-26 DIAGNOSIS — B9689 Other specified bacterial agents as the cause of diseases classified elsewhere: Secondary | ICD-10-CM

## 2012-12-26 DIAGNOSIS — A499 Bacterial infection, unspecified: Secondary | ICD-10-CM

## 2012-12-26 DIAGNOSIS — N760 Acute vaginitis: Secondary | ICD-10-CM

## 2012-12-26 DIAGNOSIS — A5400 Gonococcal infection of lower genitourinary tract, unspecified: Secondary | ICD-10-CM

## 2012-12-26 MED ORDER — METRONIDAZOLE 500 MG PO TABS: 500.0000 mg | ORAL_TABLET | Freq: Two times a day (BID) | ORAL | Status: DC

## 2012-12-26 MED ORDER — CEFTRIAXONE SODIUM 500 MG IJ SOLR
250.0000 mg | Freq: Once | INTRAMUSCULAR | Status: AC
  Administered 2012-12-26: 250 mg via INTRAMUSCULAR

## 2012-12-26 MED ORDER — METRONIDAZOLE 500 MG PO TABS: 500.0000 mg | ORAL_TABLET | Freq: Three times a day (TID) | ORAL | Status: DC

## 2012-12-26 NOTE — Progress Notes (Signed)
Quick Note:  Spoke with pt and pt came into office for injection on 12/26/12. ______

## 2012-12-26 NOTE — Telephone Encounter (Signed)
This needs to be treated. I do NOT recommend deferring tx. Waiting to be treated could cause harm if this did have anything to do with her symptoms. If she does not want to be treated she needs to see gyn or her PCP - not retesting here.

## 2012-12-26 NOTE — Patient Instructions (Signed)
-  if any further abdominal or pelvic or vaginal symptoms please see a gynecologist

## 2012-12-26 NOTE — Addendum Note (Signed)
Addended by: Azucena Freed on: 12/26/2012 11:00 AM   Modules accepted: Orders

## 2012-12-26 NOTE — Telephone Encounter (Signed)
Called and spoke with pt and pt states that she does want to be treated but would like to be retested first.  Advised pt that re-testing would not happen at our office but pt could contact PCP's office or gyn.  Pt is aware and will see if she can get in with pcp first for retesting before being treated.  Advised that is not recommended to defer treatment.  Pt verbalized understanding.

## 2012-12-26 NOTE — Progress Notes (Addendum)
No chief complaint on file.   HPI:  Follow up Gonorrhea: -no symptoms any longer -abd pain, nausea, vomiting and diarrhea have resolved -she wants treatment for the gonorrhea today -wonders if spouse needs to be tested -also wonders if males/females can be asymptomatic carriers -wonders if should retest and if negative initial test was ? false positive -spouse is present and reports he read online and there is a high rate of false positives, he is seeing his doctor and is going to request tx WITHOUT testing as he does not see the point in testing him -spouse and pt admit to sex with prior partners whom were unfaithful  ROS: See pertinent positives and negatives per HPI.  Past Medical History  Diagnosis Date  . Migraines   . Bronchitis, acute 01/18/2011  . Gastroenteritis 05/31/2011  . Suprapubic pain 05/31/2011  . Cervical cancer screening 07/12/2011  . Rectal bleeding 07/13/2011  . Headache(784.0) 07/13/2011  . Allergic state 07/13/2011  . Bronchitis 07/16/2011  . LUQ pain 08/27/2011  . Anxiety attack 11/13/2011  . History of motion sickness 11/29/2011  . Chest pain, atypical 11/29/2011  . Folliculitis 11/29/2011  . 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 Urticaria 01/11/2012    Family History  Problem Relation Age of Onset  . Stroke Mother   . Heart disease Mother   . Hyperlipidemia Mother   . Rheum arthritis Mother   . Fibroids Mother     Uterine  . Fibrocystic breast disease Mother   . Hyperlipidemia Maternal Grandmother   . Osteoarthritis Maternal Grandmother   . Glaucoma Maternal Grandmother   . Cancer Maternal Grandfather     Skin, prostate, throat, stomach    History   Social History  . Marital Status: Married    Spouse Name: N/A    Number of Children: N/A  . Years of Education: N/A   Social History Main Topics  . Smoking status: Never Smoker   . Smokeless tobacco: Never Used  . Alcohol Use: 0.0 oz/week    0 drink(s) per week     Comment: ocassional  . Drug Use: No   . Sexually Active: Yes -- Female partner(s)   Other Topics Concern  . Not on file   Social History Narrative  . No narrative on file    Current outpatient prescriptions:acyclovir (ZOVIRAX) 800 MG tablet, Take 800 mg by mouth 3 (three) times daily as needed. For bells palsy, Disp: , Rfl: ;  aspirin-acetaminophen-caffeine (EXCEDRIN MIGRAINE) 250-250-65 MG per tablet, Take 1 tablet by mouth every 6 (six) hours as needed. For migraine, Disp: , Rfl: ;  cefUROXime (CEFTIN) 500 MG tablet, 1 tab po bid x 10d, Disp: 20 tablet, Rfl: 0 Cyanocobalamin (VITAMIN B 12) 100 MCG LOZG, Take 2,000 mcg by mouth daily., Disp: , Rfl: ;  diphenhydrAMINE (BENADRYL) 25 MG tablet, Take 25 mg by mouth at bedtime as needed. For allergies, Disp: , Rfl: ;  fluticasone (FLONASE) 50 MCG/ACT nasal spray, Place 2 sprays into the nose daily. For allergies, Disp: , Rfl: ;  ibuprofen (ADVIL,MOTRIN) 200 MG tablet, Take 200-300 mg by mouth every 6 (six) hours as needed. For pain, Disp: , Rfl:  loratadine (CLARITIN) 10 MG tablet, Take 10 mg by mouth daily as needed. For allergies, Disp: , Rfl: ;  metroNIDAZOLE (FLAGYL) 500 MG tablet, Take 1 tablet (500 mg total) by mouth 3 (three) times daily., Disp: 14 tablet, Rfl: 0;  montelukast (SINGULAIR) 10 MG tablet, Take 10 mg by mouth daily as needed. For alleriges, Disp: ,  Rfl: ;  nepafenac (NEVANAC) 0.1 % ophthalmic suspension, Place 1 drop into both eyes 3 (three) times daily., Disp: , Rfl:  olopatadine (PATANOL) 0.1 % ophthalmic solution, Place 1 drop into both eyes daily as needed. For allergies, Disp: , Rfl: ;  omeprazole (PRILOSEC) 20 MG capsule, Take 1 capsule (20 mg total) by mouth daily., Disp: 90 capsule, Rfl: 1;  ondansetron (ZOFRAN ODT) 4 MG disintegrating tablet, Take 1 tablet (4 mg total) by mouth every 8 (eight) hours as needed for nausea., Disp: 20 tablet, Rfl: 0  EXAM:  There were no vitals filed for this visit.  There is no weight on file to calculate BMI.  GENERAL: vitals  reviewed and listed above, alert, oriented, appears well hydrated and in no acute distress  HEENT: atraumatic, conjunttiva clear, no obvious abnormalities on inspection of external nose and ears  MS: moves all extremities without noticeable abnormality  PSYCH: pleasant and cooperative, no obvious depression or anxiety  ASSESSMENT AND PLAN:  Discussed the following assessment and plan:  Bacterial vaginitis - Plan: DISCONTINUED: metroNIDAZOLE (FLAGYL) 500 MG tablet  Gonorrhea - Plan: metroNIDAZOLE (FLAGYL) 500 MG tablet  -advised as I did on phone that symptoms more likely viral gastroenteritis and less likely PID given symptoms have completely resolved prior to tx and neg findings on exam in terms of PID, however given potential for PID in pelvic pain tx is recommended -advised false positive less likely in urogenital samples, and the chance for false neg on repeat testing is higher then the chance for false positive, and would advise tx even if repeat testing done and negative -pt and spouse agreed to this and agreed to go ahead and treat -treatment provided with 250mg  IM rocephin, advised f/u with PCP or gyn if any further symptoms -advised again that any sexual partners be tested and treated -answered all questions -Patient advised to return or notify a doctor immediately if symptoms worsen or persist or new concerns arise.  Patient Instructions  -if any further abdominal or pelvic or vaginal symptoms please see a gynecologist     Kriste Basque R.

## 2012-12-30 ENCOUNTER — Ambulatory Visit: Payer: Managed Care, Other (non HMO) | Admitting: Family Medicine

## 2013-02-02 ENCOUNTER — Encounter: Payer: Self-pay | Admitting: Internal Medicine

## 2013-02-02 ENCOUNTER — Ambulatory Visit (INDEPENDENT_AMBULATORY_CARE_PROVIDER_SITE_OTHER): Payer: Managed Care, Other (non HMO) | Admitting: Internal Medicine

## 2013-02-02 VITALS — BP 100/66 | HR 84 | Ht 59.0 in | Wt 116.1 lb

## 2013-02-02 DIAGNOSIS — K589 Irritable bowel syndrome without diarrhea: Secondary | ICD-10-CM

## 2013-02-02 DIAGNOSIS — R1084 Generalized abdominal pain: Secondary | ICD-10-CM

## 2013-02-02 DIAGNOSIS — K219 Gastro-esophageal reflux disease without esophagitis: Secondary | ICD-10-CM

## 2013-02-02 DIAGNOSIS — K625 Hemorrhage of anus and rectum: Secondary | ICD-10-CM

## 2013-02-02 DIAGNOSIS — K649 Unspecified hemorrhoids: Secondary | ICD-10-CM

## 2013-02-02 MED ORDER — OMEPRAZOLE 20 MG PO CPDR: 20.0000 mg | DELAYED_RELEASE_CAPSULE | Freq: Every day | ORAL | Status: DC

## 2013-02-02 MED ORDER — HYDROCORTISONE ACETATE 25 MG RE SUPP: 25.0000 mg | Freq: Every evening | RECTAL | Status: DC | PRN

## 2013-02-02 MED ORDER — HYOSCYAMINE SULFATE 0.125 MG SL SUBL: SUBLINGUAL_TABLET | SUBLINGUAL | Status: DC

## 2013-02-02 NOTE — Progress Notes (Signed)
HISTORY OF PRESENT ILLNESS:  Danielle Sims is a 32 y.o. female who presents today, upon referral from her PCP, with chronic GI complaints. Patient reports being diagnosed with "gastritis" in [place] at age 79, on EGD. She states she has always had problems with her GI system. First, she reports classic reflux symptoms as manifested by regurgitation and pyrosis. No dysphagia. Next, she reports intermittent problems with severe epigastric pain followed by lower abdominal cramping, nausea, diaphoresis and then urgency with diarrhea. After defecating in several hours, symptoms improved. This happens once or twice per month. These episodes alternate with constipation. She also reports problems with hemorrhoids which occasionally are uncomfortable and bleed. Her weight has been stable. She works as a Clinical biochemist  REVIEW OF SYSTEMS:  All non-GI ROS negative except for headaches  Past Medical History  Diagnosis Date  . Migraines   . Bronchitis, acute 01/18/2011  . Gastroenteritis 05/31/2011  . Suprapubic pain 05/31/2011  . Cervical cancer screening 07/12/2011  . Rectal bleeding 07/13/2011  . Headache(784.0) 07/13/2011  . Allergic state 07/13/2011  . Bronchitis 07/16/2011  . LUQ pain 08/27/2011  . Anxiety attack 11/13/2011  . History of motion sickness 11/29/2011  . Chest pain, atypical 11/29/2011  . Folliculitis 11/29/2011  . 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 Urticaria 01/11/2012  . Appendicitis   . Other and unspecified ovarian cysts   . GERD (gastroesophageal reflux disease)   . H/O Bell's palsy     Past Surgical History  Procedure Laterality Date  . Rhinoplasty      at 32 yo  . Cheek bones corrected      at 32 yo  . Breast enhancement surgery  2008  . Appendectomy  2006    Enlarged  . Upper gastrointestinal endoscopy      Social History Danielle Sims  reports that she has never smoked. She has never used smokeless tobacco. She reports that  drinks alcohol. She reports that she does not use illicit  drugs.  family history includes Breast cancer in her maternal aunt; Cancer in her maternal grandfather; Colon cancer in her maternal grandfather; Fibrocystic breast disease in her mother; Fibroids in her mother; Glaucoma in her maternal grandmother; Heart disease in her mother; Hyperlipidemia in her maternal grandmother and mother; Osteoarthritis in her maternal grandmother; Other in her father and mother; Rheum arthritis in her mother; and Stroke in her mother.  Allergies  Allergen Reactions  . Iodinated Diagnostic Agents Hives    Pt developed 2 hives around site of injection Lt antecubital that were itchy after leaving lobby post scan. Given 50 mg oral benadryl.        PHYSICAL EXAMINATION: Vital signs: Ht 4\' 11"  (1.499 m)  Wt 116 lb 2 oz (52.674 kg)  BMI 23.44 kg/m2  Constitutional: generally well-appearing, no acute distress Psychiatric: alert and oriented x3, cooperative Eyes: extraocular movements intact, anicteric, conjunctiva pink Mouth: oral pharynx moist, no lesions Neck: supple no lymphadenopathy Cardiovascular: heart regular rate and rhythm, no murmur Lungs: clear to auscultation bilaterally Abdomen: soft, nontender, nondistended, no obvious ascites, no peritoneal signs, normal bowel sounds, no organomegaly. Stria. Small appendectomy scar Rectal: Obvious hemorrhoids. Noninflamed. Brown Hemoccult negative stool Extremities: no lower extremity edema bilaterally Skin: no lesions on visible extremities Neuro: No focal deficits.   ASSESSMENT:  #1. GERD.  #2. IBS. Alternating #3  Hemorrhoids. Symptomatic   PLAN:  #1. Reflux precautions #2. Prescribe omeprazole 20 mg daily #3. Levsin sublingual prescribed to be used when necessary cramping abdominal pain #  4. Anusol-HC suppositories prescribed for hemorrhoids #5. EGD to evaluate chronic reflux symptoms and epigastric pain. Rule out H. pylori. Rule out ulcers.

## 2013-02-02 NOTE — Patient Instructions (Addendum)
You have been scheduled for an endoscopy with propofol. Please follow written instructions given to you at your visit today. If you use inhalers (even only as needed), please bring them with you on the day of your procedure. Your physician has requested that you go to www.[email or web address].[email or web address] and enter the access code given to you at your visit today. This web site gives a general overview about your procedure. However, you should still follow specific instructions given to you by our office regarding your preparation for the procedure.   We have sent the following medications to your pharmacy for you to pick up at your convenience: Omeprazole, Levsin, Anusol Suppositories

## 2013-02-03 ENCOUNTER — Other Ambulatory Visit: Payer: Self-pay | Admitting: Family Medicine

## 2013-02-03 ENCOUNTER — Ambulatory Visit (INDEPENDENT_AMBULATORY_CARE_PROVIDER_SITE_OTHER): Payer: Managed Care, Other (non HMO) | Admitting: Family Medicine

## 2013-02-03 ENCOUNTER — Other Ambulatory Visit (HOSPITAL_COMMUNITY)
Admission: RE | Admit: 2013-02-03 | Discharge: 2013-02-03 | Disposition: A | Discharge status: Home / Self Care | Payer: Managed Care, Other (non HMO) | Source: Ambulatory Visit | Attending: Family Medicine | Admitting: Family Medicine

## 2013-02-03 ENCOUNTER — Encounter: Payer: Self-pay | Admitting: Internal Medicine

## 2013-02-03 ENCOUNTER — Encounter: Payer: Self-pay | Admitting: Family Medicine

## 2013-02-03 VITALS — BP 102/68 | HR 71 | Temp 98.8°F | Ht 59.0 in | Wt 115.0 lb

## 2013-02-03 DIAGNOSIS — R109 Unspecified abdominal pain: Secondary | ICD-10-CM

## 2013-02-03 DIAGNOSIS — N760 Acute vaginitis: Secondary | ICD-10-CM | POA: Insufficient documentation

## 2013-02-03 DIAGNOSIS — R894 Abnormal immunological findings in specimens from other organs, systems and tissues: Secondary | ICD-10-CM

## 2013-02-03 DIAGNOSIS — T7840XD Allergy, unspecified, subsequent encounter: Secondary | ICD-10-CM

## 2013-02-03 DIAGNOSIS — A5400 Gonococcal infection of lower genitourinary tract, unspecified: Secondary | ICD-10-CM

## 2013-02-03 DIAGNOSIS — Z124 Encounter for screening for malignant neoplasm of cervix: Secondary | ICD-10-CM

## 2013-02-03 DIAGNOSIS — D510 Vitamin B12 deficiency anemia due to intrinsic factor deficiency: Secondary | ICD-10-CM

## 2013-02-03 DIAGNOSIS — R768 Other specified abnormal immunological findings in serum: Secondary | ICD-10-CM

## 2013-02-03 DIAGNOSIS — Z01419 Encounter for gynecological examination (general) (routine) without abnormal findings: Secondary | ICD-10-CM | POA: Insufficient documentation

## 2013-02-03 DIAGNOSIS — E538 Deficiency of other specified B group vitamins: Secondary | ICD-10-CM

## 2013-02-03 DIAGNOSIS — Z0000 Encounter for general adult medical examination without abnormal findings: Secondary | ICD-10-CM

## 2013-02-03 DIAGNOSIS — Z113 Encounter for screening for infections with a predominantly sexual mode of transmission: Secondary | ICD-10-CM | POA: Insufficient documentation

## 2013-02-03 DIAGNOSIS — Z5189 Encounter for other specified aftercare: Secondary | ICD-10-CM

## 2013-02-03 DIAGNOSIS — A549 Gonococcal infection, unspecified: Secondary | ICD-10-CM

## 2013-02-03 LAB — CBC
MCH: 29.3 pg (ref 26.0–34.0)
Platelets: 351 10*3/uL (ref 150–400)
RBC: 4.6 MIL/uL (ref 3.87–5.11)
WBC: 9.8 10*3/uL (ref 4.0–10.5)

## 2013-02-03 LAB — HIV ANTIBODY (ROUTINE TESTING W REFLEX): HIV: NONREACTIVE

## 2013-02-03 LAB — HEPATIC FUNCTION PANEL
Albumin: 4.2 g/dL (ref 3.5–5.2)
Alkaline Phosphatase: 65 U/L (ref 39–117)
Bilirubin, Direct: 0.1 mg/dL (ref 0.0–0.3)
Total Bilirubin: 0.4 mg/dL (ref 0.3–1.2)

## 2013-02-03 LAB — RPR

## 2013-02-03 NOTE — Assessment & Plan Note (Signed)
Doing well 

## 2013-02-03 NOTE — Patient Instructions (Addendum)
Start a probiotic such as Digestive Advantage and a fiber supplement such as Benefiber   Preventive Care for Adults, Female A healthy lifestyle and preventive care can promote health and wellness. Preventive health guidelines for women include the following key practices.  A routine yearly physical is a good way to check with your caregiver about your health and preventive screening. It is a chance to share any concerns and updates on your health, and to receive a thorough exam.  Visit your dentist for a routine exam and preventive care every 6 months. Brush your teeth twice a day and floss once a day. Good oral hygiene prevents tooth decay and gum disease.  The frequency of eye exams is based on your age, health, family medical history, use of contact lenses, and other factors. Follow your caregiver's recommendations for frequency of eye exams.  Eat a healthy diet. Foods like vegetables, fruits, whole grains, low-fat dairy products, and lean protein foods contain the nutrients you need without too many calories. Decrease your intake of foods high in solid fats, added sugars, and salt. Eat the right amount of calories for you.Get information about a proper diet from your caregiver, if necessary.  Regular physical exercise is one of the most important things you can do for your health. Most adults should get at least 150 minutes of moderate-intensity exercise (any activity that increases your heart rate and causes you to sweat) each week. In addition, most adults need muscle-strengthening exercises on 2 or more days a week.  Maintain a healthy weight. The body mass index (BMI) is a screening tool to identify possible weight problems. It provides an estimate of body fat based on height and weight. Your caregiver can help determine your BMI, and can help you achieve or maintain a healthy weight.For adults 20 years and older:  A BMI below 18.5 is considered underweight.  A BMI of 18.5 to 24.9 is  normal.  A BMI of 25 to 29.9 is considered overweight.  A BMI of 30 and above is considered obese.  Maintain normal blood lipids and cholesterol levels by exercising and minimizing your intake of saturated fat. Eat a balanced diet with plenty of fruit and vegetables. Blood tests for lipids and cholesterol should begin at age 18 and be repeated every 5 years. If your lipid or cholesterol levels are high, you are over 50, or you are at high risk for heart disease, you may need your cholesterol levels checked more frequently.Ongoing high lipid and cholesterol levels should be treated with medicines if diet and exercise are not effective.  If you smoke, find out from your caregiver how to quit. If you do not use tobacco, do not start.  If you are pregnant, do not drink alcohol. If you are breastfeeding, be very cautious about drinking alcohol. If you are not pregnant and choose to drink alcohol, do not exceed 1 drink per day. One drink is considered to be 12 ounces (355 mL) of beer, 5 ounces (148 mL) of wine, or 1.5 ounces (44 mL) of liquor.  Avoid use of street drugs. Do not share needles with anyone. Ask for help if you need support or instructions about stopping the use of drugs.  High blood pressure causes heart disease and increases the risk of stroke. Your blood pressure should be checked at least every 1 to 2 years. Ongoing high blood pressure should be treated with medicines if weight loss and exercise are not effective.  If you are 55 to  31 years old, ask your caregiver if you should take aspirin to prevent strokes.  Diabetes screening involves taking a blood sample to check your fasting blood sugar level. This should be done once every 3 years, after age 54, if you are within normal weight and without risk factors for diabetes. Testing should be considered at a younger age or be carried out more frequently if you are overweight and have at least 1 risk factor for diabetes.  Breast cancer  screening is essential preventive care for women. You should practice "breast self-awareness." This means understanding the normal appearance and feel of your breasts and may include breast self-examination. Any changes detected, no matter how small, should be reported to a caregiver. Women in their 88s and 30s should have a clinical breast exam (CBE) by a caregiver as part of a regular health exam every 1 to 3 years. After age 31, women should have a CBE every year. Starting at age 35, women should consider having a mammography (breast X-ray test) every year. Women who have a family history of breast cancer should talk to their caregiver about genetic screening. Women at a high risk of breast cancer should talk to their caregivers about having magnetic resonance imaging (MRI) and a mammography every year.  The Pap test is a screening test for cervical cancer. A Pap test can show cell changes on the cervix that might become cervical cancer if left untreated. A Pap test is a procedure in which cells are obtained and examined from the lower end of the uterus (cervix).  Women should have a Pap test starting at age 33.  Between ages 68 and 55, Pap tests should be repeated every 2 years.  Beginning at age 61, you should have a Pap test every 3 years as long as the past 3 Pap tests have been normal.  Some women have medical problems that increase the chance of getting cervical cancer. Talk to your caregiver about these problems. It is especially important to talk to your caregiver if a new problem develops soon after your last Pap test. In these cases, your caregiver may recommend more frequent screening and Pap tests.  The above recommendations are the same for women who have or have not gotten the vaccine for human papillomavirus (HPV).  If you had a hysterectomy for a problem that was not cancer or a condition that could lead to cancer, then you no longer need Pap tests. Even if you no longer need a Pap  test, a regular exam is a good idea to make sure no other problems are starting.  If you are between ages 50 and 36, and you have had normal Pap tests going back 10 years, you no longer need Pap tests. Even if you no longer need a Pap test, a regular exam is a good idea to make sure no other problems are starting.  If you have had past treatment for cervical cancer or a condition that could lead to cancer, you need Pap tests and screening for cancer for at least 20 years after your treatment.  If Pap tests have been discontinued, risk factors (such as a new sexual partner) need to be reassessed to determine if screening should be resumed.  The HPV test is an additional test that may be used for cervical cancer screening. The HPV test looks for the virus that can cause the cell changes on the cervix. The cells collected during the Pap test can be tested for HPV.  The HPV test could be used to screen women aged 25 years and older, and should be used in women of any age who have unclear Pap test results. After the age of 51, women should have HPV testing at the same frequency as a Pap test.  Colorectal cancer can be detected and often prevented. Most routine colorectal cancer screening begins at the age of 75 and continues through age 51. However, your caregiver may recommend screening at an earlier age if you have risk factors for colon cancer. On a yearly basis, your caregiver may provide home test kits to check for hidden blood in the stool. Use of a small camera at the end of a tube, to directly examine the colon (sigmoidoscopy or colonoscopy), can detect the earliest forms of colorectal cancer. Talk to your caregiver about this at age 48, when routine screening begins. Direct examination of the colon should be repeated every 5 to 10 years through age 26, unless early forms of pre-cancerous polyps or small growths are found.  Hepatitis C blood testing is recommended for all people born from [birth year] through  1965 and any individual with known risks for hepatitis C.  Practice safe sex. Use condoms and avoid high-risk sexual practices to reduce the spread of sexually transmitted infections (STIs). STIs include gonorrhea, chlamydia, syphilis, trichomonas, herpes, HPV, and human immunodeficiency virus (HIV). Herpes, HIV, and HPV are viral illnesses that have no cure. They can result in disability, cancer, and death. Sexually active women aged 77 and younger should be checked for chlamydia. Older women with new or multiple partners should also be tested for chlamydia. Testing for other STIs is recommended if you are sexually active and at increased risk.  Osteoporosis is a disease in which the bones lose minerals and strength with aging. This can result in serious bone fractures. The risk of osteoporosis can be identified using a bone density scan. Women ages 50 and over and women at risk for fractures or osteoporosis should discuss screening with their caregivers. Ask your caregiver whether you should take a calcium supplement or vitamin D to reduce the rate of osteoporosis.  Menopause can be associated with physical symptoms and risks. Hormone replacement therapy is available to decrease symptoms and risks. You should talk to your caregiver about whether hormone replacement therapy is right for you.  Use sunscreen with sun protection factor (SPF) of 30 or more. Apply sunscreen liberally and repeatedly throughout the day. You should seek shade when your shadow is shorter than you. Protect yourself by wearing long sleeves, pants, a wide-brimmed hat, and sunglasses year round, whenever you are outdoors.  Once a month, do a whole body skin exam, using a mirror to look at the skin on your back. Notify your caregiver of new moles, moles that have irregular borders, moles that are larger than a pencil eraser, or moles that have changed in shape or color.  Stay current with required immunizations.  Influenza. You  need a dose every fall (or winter). The composition of the flu vaccine changes each year, so being vaccinated once is not enough.  Pneumococcal polysaccharide. You need 1 to 2 doses if you smoke cigarettes or if you have certain chronic medical conditions. You need 1 dose at age 28 (or older) if you have never been vaccinated.  Tetanus, diphtheria, pertussis (Tdap, Td). Get 1 dose of Tdap vaccine if you are younger than age 76, are over 17 and have contact with an infant, are a Research scientist (physical sciences), are  pregnant, or simply want to be protected from whooping cough. After that, you need a Td booster dose every 10 years. Consult your caregiver if you have not had at least 3 tetanus and diphtheria-containing shots sometime in your life or have a deep or dirty wound.  HPV. You need this vaccine if you are a woman age 59 or younger. The vaccine is given in 3 doses over 6 months.  Measles, mumps, rubella (MMR). You need at least 1 dose of MMR if you were born in 1957 or later. You may also need a second dose.  Meningococcal. If you are age 56 to 25 and a first-year college student living in a residence hall, or have one of several medical conditions, you need to get vaccinated against meningococcal disease. You may also need additional booster doses.  Zoster (shingles). If you are age 35 or older, you should get this vaccine.  Varicella (chickenpox). If you have never had chickenpox or you were vaccinated but received only 1 dose, talk to your caregiver to find out if you need this vaccine.  Hepatitis A. You need this vaccine if you have a specific risk factor for hepatitis A virus infection or you simply wish to be protected from this disease. The vaccine is usually given as 2 doses, 6 to 18 months apart.  Hepatitis B. You need this vaccine if you have a specific risk factor for hepatitis B virus infection or you simply wish to be protected from this disease. The vaccine is given in 3 doses, usually over 6  months. Preventive Services / Frequency Ages 85 to 71  Blood pressure check.** / Every 1 to 2 years.  Lipid and cholesterol check.** / Every 5 years beginning at age 62.  Clinical breast exam.** / Every 3 years for women in their 6s and 30s.  Pap test.** / Every 2 years from ages 69 through 72. Every 3 years starting at age 63 through age 25 or 49 with a history of 3 consecutive normal Pap tests.  HPV screening.** / Every 3 years from ages 58 through ages 29 to 70 with a history of 3 consecutive normal Pap tests.  Hepatitis C blood test.** / For any individual with known risks for hepatitis C.  Skin self-exam. / Monthly.  Influenza immunization.** / Every year.  Pneumococcal polysaccharide immunization.** / 1 to 2 doses if you smoke cigarettes or if you have certain chronic medical conditions.  Tetanus, diphtheria, pertussis (Tdap, Td) immunization. / A one-time dose of Tdap vaccine. After that, you need a Td booster dose every 10 years.  HPV immunization. / 3 doses over 6 months, if you are 31 and younger.  Measles, mumps, rubella (MMR) immunization. / You need at least 1 dose of MMR if you were born in 1957 or later. You may also need a second dose.  Meningococcal immunization. / 1 dose if you are age 63 to 81 and a first-year college student living in a residence hall, or have one of several medical conditions, you need to get vaccinated against meningococcal disease. You may also need additional booster doses.  Varicella immunization.** / Consult your caregiver.  Hepatitis A immunization.** / Consult your caregiver. 2 doses, 6 to 18 months apart.  Hepatitis B immunization.** / Consult your caregiver. 3 doses usually over 6 months. Ages 30 to 25  Blood pressure check.** / Every 1 to 2 years.  Lipid and cholesterol check.** / Every 5 years beginning at age 79.  Clinical breast  exam.** / Every year after age 9.  Mammogram.** / Every year beginning at age 32 and  continuing for as long as you are in good health. Consult with your caregiver.  Pap test.** / Every 3 years starting at age 56 through age 67 or 9 with a history of 3 consecutive normal Pap tests.  HPV screening.** / Every 3 years from ages 36 through ages 91 to 72 with a history of 3 consecutive normal Pap tests.  Fecal occult blood test (FOBT) of stool. / Every year beginning at age 14 and continuing until age 98. You may not need to do this test if you get a colonoscopy every 10 years.  Flexible sigmoidoscopy or colonoscopy.** / Every 5 years for a flexible sigmoidoscopy or every 10 years for a colonoscopy beginning at age 66 and continuing until age 89.  Hepatitis C blood test.** / For all people born from [birth year] through 1964 and any individual with known risks for hepatitis C.  Skin self-exam. / Monthly.  Influenza immunization.** / Every year.  Pneumococcal polysaccharide immunization.** / 1 to 2 doses if you smoke cigarettes or if you have certain chronic medical conditions.  Tetanus, diphtheria, pertussis (Tdap, Td) immunization.** / A one-time dose of Tdap vaccine. After that, you need a Td booster dose every 10 years.  Measles, mumps, rubella (MMR) immunization. / You need at least 1 dose of MMR if you were born in 1957 or later. You may also need a second dose.  Varicella immunization.** / Consult your caregiver.  Meningococcal immunization.** / Consult your caregiver.  Hepatitis A immunization.** / Consult your caregiver. 2 doses, 6 to 18 months apart.  Hepatitis B immunization.** / Consult your caregiver. 3 doses, usually over 6 months. Ages 45 and over  Blood pressure check.** / Every 1 to 2 years.  Lipid and cholesterol check.** / Every 5 years beginning at age 60.  Clinical breast exam.** / Every year after age 26.  Mammogram.** / Every year beginning at age 38 and continuing for as long as you are in good health. Consult with your caregiver.  Pap test.** / Every  3 years starting at age 59 through age 63 or 63 with a 3 consecutive normal Pap tests. Testing can be stopped between 65 and 70 with 3 consecutive normal Pap tests and no abnormal Pap or HPV tests in the past 10 years.  HPV screening.** / Every 3 years from ages 14 through ages 55 or 6 with a history of 3 consecutive normal Pap tests. Testing can be stopped between 65 and 70 with 3 consecutive normal Pap tests and no abnormal Pap or HPV tests in the past 10 years.  Fecal occult blood test (FOBT) of stool. / Every year beginning at age 52 and continuing until age 72. You may not need to do this test if you get a colonoscopy every 10 years.  Flexible sigmoidoscopy or colonoscopy.** / Every 5 years for a flexible sigmoidoscopy or every 10 years for a colonoscopy beginning at age 59 and continuing until age 37.  Hepatitis C blood test.** / For all people born from [birth year] through 1964 and any individual with known risks for hepatitis C.  Osteoporosis screening.** / A one-time screening for women ages 70 and over and women at risk for fractures or osteoporosis.  Skin self-exam. / Monthly.  Influenza immunization.** / Every year.  Pneumococcal polysaccharide immunization.** / 1 dose at age 32 (or older) if you have never been vaccinated.  Tetanus,  diphtheria, pertussis (Tdap, Td) immunization. / A one-time dose of Tdap vaccine if you are over 65 and have contact with an infant, are a Research scientist (physical sciences), or simply want to be protected from whooping cough. After that, you need a Td booster dose every 10 years.  Varicella immunization.** / Consult your caregiver.  Meningococcal immunization.** / Consult your caregiver.  Hepatitis A immunization.** / Consult your caregiver. 2 doses, 6 to 18 months apart.  Hepatitis B immunization.** / Check with your caregiver. 3 doses, usually over 6 months. ** Family history and personal history of risk and conditions may change your caregiver's  recommendations. Document Released: 08/14/2001 Document Revised: 09/10/2011 Document Reviewed: 11/13/2010 [facility] Patient Information 2014 [place], [place].

## 2013-02-04 ENCOUNTER — Encounter: Payer: Self-pay | Admitting: Internal Medicine

## 2013-02-04 ENCOUNTER — Ambulatory Visit (AMBULATORY_SURGERY_CENTER): Payer: Managed Care, Other (non HMO) | Admitting: Internal Medicine

## 2013-02-04 VITALS — BP 102/64 | HR 75 | Temp 97.6°F | Resp 20 | Ht 59.0 in | Wt 116.0 lb

## 2013-02-04 DIAGNOSIS — K219 Gastro-esophageal reflux disease without esophagitis: Secondary | ICD-10-CM

## 2013-02-04 DIAGNOSIS — Z8619 Personal history of other infectious and parasitic diseases: Secondary | ICD-10-CM

## 2013-02-04 DIAGNOSIS — R1084 Generalized abdominal pain: Secondary | ICD-10-CM

## 2013-02-04 LAB — URINALYSIS
Hgb urine dipstick: NEGATIVE
Ketones, ur: NEGATIVE mg/dL
Nitrite: NEGATIVE
Specific Gravity, Urine: >1.03 — ABNORMAL HIGH (ref 1.005–1.030)
Urobilinogen, UA: 0.2 mg/dL (ref 0.0–1.0)

## 2013-02-04 LAB — CULTURE, URINE COMPREHENSIVE: Organism ID, Bacteria: NO GROWTH

## 2013-02-04 LAB — HSV(HERPES SIMPLEX VRS) I + II AB-IGG: HSV 1 Glycoprotein G Ab, IgG: 0.26 IV

## 2013-02-04 MED ORDER — SODIUM CHLORIDE 0.9 % IV SOLN: 500.0000 mL | INTRAVENOUS | Status: DC

## 2013-02-04 NOTE — Progress Notes (Signed)
Patient did not experience any of the following events: a burn prior to discharge; a fall within the facility; wrong site/side/patient/procedure/implant event; or a hospital transfer or hospital admission upon discharge from the facility. (G8907) Patient did not have preoperative order for IV antibiotic SSI prophylaxis. (G8918)  

## 2013-02-04 NOTE — Progress Notes (Signed)
Called to room to assist during endoscopic procedure.  Patient ID and intended procedure confirmed with present staff. Received instructions for my participation in the procedure from the performing physician. ewm 

## 2013-02-04 NOTE — Progress Notes (Signed)
Lidocaine-40mg IV prior to Propofol InductionPropofol given over incremental dosages 

## 2013-02-04 NOTE — Op Note (Signed)
[facility] [address] [place], [address]   ENDOSCOPY PROCEDURE REPORT  PATIENT: Danielle Sims, Danielle Sims  MR#: [id number] BIRTHDATE: 10-10-1980 , 32  yrs. old GENDER: Female ENDOSCOPIST: Roxy Cedar, MD REFERRED BY:  Reuel Derby, M.D. PROCEDURE DATE:  02/04/2013 PROCEDURE:  EGD w/ biopsy ASA CLASS:     Class II INDICATIONS:  History of esophageal reflux.   abdominal pain.  prior history of H. pylori gastritis MEDICATIONS: MAC sedation, administered by CRNA and propofol (Diprivan) 200mg  IV TOPICAL ANESTHETIC: none  DESCRIPTION OF PROCEDURE: After the risks benefits and alternatives of the procedure were thoroughly explained, informed consent was obtained.  The LB [id number] endoscope was introduced through the mouth and advanced to the second portion of the duodenum. Without limitations.  The instrument was slowly withdrawn as the mucosa was fully examined.      EXAM: The upper, middle and distal third of the esophagus were carefully inspected and no abnormalities were noted.  The z-line was well seen at the GEJ.  The endoscope was pushed into the fundus which was normal including a retroflexed view.  The antrum, gastric body, first and second part of the duodenum were unremarkable. Retroflexed views revealed no abnormalities.     The scope was then withdrawn from the patient and the procedure completed.  COMPLICATIONS: There were no complications. ENDOSCOPIC IMPRESSION: 1. Normal EGD 2. GERD  RECOMMENDATIONS: 1.  Begin omeprazole 20 mg daily. Previously prescribed 2.  Reflux precautions. Provide patient with information 2.  Call for office visit to be seen in 4-6 weeks  REPEAT EXAM:  eSigned:  Roxy Cedar, MD 02/04/2013 3:19 PM   MV:HQION Abner Greenspan, MD and The Patient

## 2013-02-04 NOTE — Patient Instructions (Addendum)
YOU HAD AN ENDOSCOPIC PROCEDURE TODAY AT THE [facility]: Refer to the procedure report that was given to you for any specific questions about what was found during the examination.  If the procedure report does not answer your questions, please call your gastroenterologist to clarify.  If you requested that your care partner not be given the details of your procedure findings, then the procedure report has been included in a sealed envelope for you to review at your convenience later.  YOU SHOULD EXPECT: Some feelings of bloating in the abdomen. Passage of more gas than usual.  Walking can help get rid of the air that was put into your GI tract during the procedure and reduce the bloating. If you had a lower endoscopy (such as a colonoscopy or flexible sigmoidoscopy) you may notice spotting of blood in your stool or on the toilet paper. If you underwent a bowel prep for your procedure, then you may not have a normal bowel movement for a few days.  DIET: Your first meal following the procedure should be a light meal and then it is ok to progress to your normal diet.  A half-sandwich or bowl of soup is an example of a good first meal.  Heavy or fried foods are harder to digest and may make you feel nauseous or bloated.  Likewise meals heavy in dairy and vegetables can cause extra gas to form and this can also increase the bloating.  Drink plenty of fluids but you should avoid alcoholic beverages for 24 hours.  ACTIVITY: Your care partner should take you home directly after the procedure.  You should plan to take it easy, moving slowly for the rest of the day.  You can resume normal activity the day after the procedure however you should NOT DRIVE or use heavy machinery for 24 hours (because of the sedation medicines used during the test).    SYMPTOMS TO REPORT IMMEDIATELY: A gastroenterologist can be reached at any hour.  During normal business hours, 8:30 AM to 5:00 PM Monday through Friday,  call [phone].  After hours and on weekends, please call the GI answering service at [phone] who will take a message and have the physician on call contact you.    Following upper endoscopy (EGD)  Vomiting of blood or coffee ground material  New chest pain or pain under the shoulder blades  Painful or persistently difficult swallowing  New shortness of breath  Fever of 100F or higher  Black, tarry-looking stools  FOLLOW UP: If any biopsies were taken you will be contacted by phone or by letter within the next 1-3 weeks.  Call your gastroenterologist if you have not heard about the biopsies in 3 weeks.  Our staff will call the home number listed on your records the next business day following your procedure to check on you and address any questions or concerns that you may have at that time regarding the information given to you following your procedure. This is a courtesy call and so if there is no answer at the home number and we have not heard from you through the emergency physician on call, we will assume that you have returned to your regular daily activities without incident.  SIGNATURES/CONFIDENTIALITY: You and/or your care partner have signed paperwork which will be entered into your electronic medical record.  These signatures attest to the fact that that the information above on your After Visit Summary has been reviewed and is understood.  Full   responsibility of the confidentiality of this discharge information lies with you and/or your care-partner.   Begin omeprazole 20mg  daily.   Reflux precautions given.  GERD information given.  Call Dr. Lamar Sprinkles office for appoinment in 4-6 weeks.

## 2013-02-05 ENCOUNTER — Telehealth: Payer: Self-pay

## 2013-02-05 LAB — T4

## 2013-02-05 NOTE — Progress Notes (Signed)
Quick Note:  Patient Informed and voiced understanding ______ 

## 2013-02-06 ENCOUNTER — Telehealth: Payer: Self-pay | Admitting: *Deleted

## 2013-02-06 NOTE — Telephone Encounter (Signed)
Message left

## 2013-02-06 NOTE — Telephone Encounter (Signed)
[facility] called and left a message stating that they had a question about pts labs?  I called and left Emante a message to give [place] a call back

## 2013-02-08 ENCOUNTER — Encounter: Payer: Self-pay | Admitting: Family Medicine

## 2013-02-08 DIAGNOSIS — R768 Other specified abnormal immunological findings in serum: Secondary | ICD-10-CM

## 2013-02-08 DIAGNOSIS — A549 Gonococcal infection, unspecified: Secondary | ICD-10-CM

## 2013-02-08 HISTORY — DX: Gonococcal infection, unspecified: A54.9

## 2013-02-08 HISTORY — DX: Other specified abnormal immunological findings in serum: R76.8

## 2013-02-08 NOTE — Assessment & Plan Note (Signed)
Pap negativefor any concerns.

## 2013-02-08 NOTE — Progress Notes (Signed)
Patient ID: Danielle Sims, female   DOB: 23-Aug-1980, 32 y.o.   MRN: [id number] Danielle Sims [id number] 03-08-1981 02/08/2013      Progress Note-Follow Up  Subjective  Chief Complaint  Chief Complaint  Patient presents with  . Annual Exam    physical  . Gynecologic Exam    pap and breast exam    HPI  Patient is a 32 year old female who is in today for annual GYN exam. She is feeling well but is concerned about her diagnosis of gonorrhea last month. Denies any vaginal discharge or sores but does acknowledge some persistent suprapubic discomfort. Denies dysuria or hematuria. Is noting some nocturia getting up several times at night. Does note she has a suprapubic pain that sometimes radiates to the right lower quadrant. Has had trouble with ovarian cysts in the past. Continues to struggle with hemorrhoids and irritable bowel symptoms as well but does feel the omeprazole is helping has been given hyoscyamine to use when necessary when necessary. Did have an upper endoscopy yesterday. No chest pain no palpitations, shortness of breath here in is back in school and working has switched jobs and feels her stress level is more manageable.  Past Medical History  Diagnosis Date  . Migraines   . Bronchitis, acute 01/18/2011  . Gastroenteritis 05/31/2011  . Suprapubic pain 05/31/2011  . Cervical cancer screening 07/12/2011  . Rectal bleeding 07/13/2011  . Headache(784.0) 07/13/2011  . Allergic state 07/13/2011  . Bronchitis 07/16/2011  . LUQ pain 08/27/2011  . Anxiety attack 11/13/2011  . History of motion sickness 11/29/2011  . Chest pain, atypical 11/29/2011  . Folliculitis 11/29/2011  . 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 Urticaria 01/11/2012  . Appendicitis   . Other and unspecified ovarian cysts   . GERD (gastroesophageal reflux disease)   . H/O Bell's palsy   . 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 Gonorrhea 02/08/2013  . Preventative health care 02/08/2013    Past Surgical History  Procedure Laterality Date  .  Rhinoplasty      at 32 yo  . Cheek bones corrected      at 32 yo  . Breast enhancement surgery  2008  . Appendectomy  2006    Enlarged  . Upper gastrointestinal endoscopy      Family History  Problem Relation Age of Onset  . Stroke Mother   . Heart disease Mother   . Hyperlipidemia Mother   . Rheum arthritis Mother   . Fibroids Mother     Uterine  . Fibrocystic breast disease Mother   . Other Mother     gastritis  . Hyperlipidemia Maternal Grandmother   . Osteoarthritis Maternal Grandmother   . Glaucoma Maternal Grandmother   . Kidney disease Maternal Grandmother   . Cancer Maternal Grandfather     Skin, prostate, throat, stomach  . Colon cancer Maternal Grandfather   . Breast cancer Maternal Aunt   . Other Father     gastritis  . Heart disease Father     arteriosclerosis  . Arthritis Father     femoral head necrosis b/l    History   Social History  . Marital Status: Married    Spouse Name: N/A    Number of Children: 1  . Years of Education: N/A   Occupational History  . CMA    Social History Main Topics  . Smoking status: Never Smoker   . Smokeless tobacco: Never Used  . Alcohol Use: 0.0 oz/week    0 drink(s) per week  Comment: ocassional-2-3 weekly  . Drug Use: No  . Sexually Active: Yes -- Female partner(s)   Other Topics Concern  . Not on file   Social History Narrative  . No narrative on file    Current Outpatient Prescriptions on File Prior to Visit  Medication Sig Dispense Refill  . aspirin-acetaminophen-caffeine (EXCEDRIN MIGRAINE) 250-250-65 MG per tablet Take 1 tablet by mouth every 6 (six) hours as needed. For migraine      . diphenhydrAMINE (BENADRYL) 25 MG tablet Take 25 mg by mouth at bedtime as needed. For allergies      . hydrocortisone (ANUSOL-HC) 25 MG suppository Place 1 suppository (25 mg total) rectally at bedtime as needed for hemorrhoids.  30 suppository  3  . hyoscyamine (LEVSIN SL) 0.125 MG SL tablet 1-2 tablets  sublingually every 4 hours as needed for pain  30 tablet  6  . montelukast (SINGULAIR) 10 MG tablet Take 10 mg by mouth daily as needed. For alleriges      . olopatadine (PATANOL) 0.1 % ophthalmic solution Place 1 drop into both eyes daily as needed. For allergies      . omeprazole (PRILOSEC) 20 MG capsule Take 1 capsule (20 mg total) by mouth daily.  90 capsule  1   No current facility-administered medications on file prior to visit.    Allergies  Allergen Reactions  . Iodinated Diagnostic Agents Hives    Pt developed 2 hives around site of injection Lt antecubital that were itchy after leaving lobby post scan. Given 50 mg oral benadryl.     Review of Systems  Review of Systems  Constitutional: Negative for fever, chills and malaise/fatigue.  HENT: Negative for hearing loss, nosebleeds and congestion.   Eyes: Negative for pain and discharge.  Respiratory: Negative for cough, sputum production, shortness of breath and wheezing.   Cardiovascular: Negative for chest pain, palpitations and leg swelling.  Gastrointestinal: Positive for abdominal pain. Negative for heartburn, nausea, vomiting, diarrhea, constipation and blood in stool.  Genitourinary: Negative for dysuria, urgency, frequency and hematuria.  Musculoskeletal: Negative for myalgias, back pain and falls.  Skin: Negative for rash.  Neurological: Negative for dizziness, tremors, sensory change, focal weakness, loss of consciousness, weakness and headaches.  Endo/Heme/Allergies: Negative for polydipsia. Does not bruise/bleed easily.  Psychiatric/Behavioral: Negative for depression and suicidal ideas. The patient is not nervous/anxious and does not have insomnia.     Objective  BP 102/68  Pulse 71  Temp(Src) 98.8 F (37.1 C) (Oral)  Ht 4\' 11"  (1.499 m)  Wt 115 lb (52.164 kg)  BMI 23.21 kg/m2  SpO2 91%  LMP 01/12/2013  Physical Exam  Physical Exam  Constitutional: She is oriented to person, place, and time and  well-developed, well-nourished, and in no distress. No distress.  HENT:  Head: Normocephalic and atraumatic.  Right Ear: External ear normal.  Left Ear: External ear normal.  Nose: Nose normal.  Mouth/Throat: Oropharynx is clear and moist. No oropharyngeal exudate.  Eyes: Conjunctivae are normal. Pupils are equal, round, and reactive to light. Right eye exhibits no discharge. Left eye exhibits no discharge. No scleral icterus.  Neck: Normal range of motion. Neck supple. No thyromegaly present.  Cardiovascular: Normal rate, regular rhythm and intact distal pulses.  Exam reveals no gallop.   No murmur heard. Pulmonary/Chest: Effort normal and breath sounds normal. No respiratory distress. She has no wheezes. She has no rales.  Abdominal: Soft. Bowel sounds are normal. She exhibits no distension and no mass. There is no tenderness.  Genitourinary: Uterus normal, cervix normal, right adnexa normal and left adnexa normal. Vaginal discharge found.  Musculoskeletal: Normal range of motion. She exhibits no edema and no tenderness.  Lymphadenopathy:    She has no cervical adenopathy.  Neurological: She is alert and oriented to person, place, and time. She has normal reflexes. No cranial nerve deficit. Coordination normal.  Skin: Skin is warm and dry. No rash noted. She is not diaphoretic.  Psychiatric: Mood, memory and affect normal.    Lab Results  Component Value Date   TSH 4.683* 02/03/2013   Lab Results  Component Value Date   WBC 9.8 02/03/2013   HGB 13.5 02/03/2013   HCT 39.6 02/03/2013   MCV 86.1 02/03/2013   PLT 351 02/03/2013   Lab Results  Component Value Date   CREATININE 0.6 12/24/2012   BUN 14 12/24/2012   NA 134* 12/24/2012   K 3.7 12/24/2012   CL 102 12/24/2012   CO2 25 12/24/2012   Lab Results  Component Value Date   ALT 32 02/03/2013   AST 22 02/03/2013   ALKPHOS 65 02/03/2013   BILITOT 0.4 02/03/2013   Lab Results  Component Value Date   CHOL 169 07/19/2011   Lab Results   Component Value Date   HDL 52.40 07/19/2011   Lab Results  Component Value Date   LDLCALC 92 07/19/2011   Lab Results  Component Value Date   TRIG 123.0 07/19/2011   Lab Results  Component Value Date   CHOLHDL 3 07/19/2011     Assessment & Plan  Allergic state Doing well  HSV-2 seropositive hsv 2 positive this year and negative last year. Patient to return to discuss diagnosis  Gonorrhea Needs repeat testing.  Cervical cancer screening Pap negativefor any concerns.  ANEMIA, PERNICIOUS Doing well at present time  Preventative health care Encouraged regular sleep, regular exercise, heart healthy diet. Pap today, labs reviewed

## 2013-02-08 NOTE — Assessment & Plan Note (Signed)
Encouraged regular sleep, regular exercise, heart healthy diet. Pap today, labs reviewed

## 2013-02-08 NOTE — Assessment & Plan Note (Signed)
hsv 2 positive this year and negative last year. Patient to return to discuss diagnosis

## 2013-02-08 NOTE — Assessment & Plan Note (Signed)
Doing well at present time 

## 2013-02-08 NOTE — Assessment & Plan Note (Signed)
Needs repeat testing

## 2013-02-13 ENCOUNTER — Ambulatory Visit: Payer: Managed Care, Other (non HMO) | Admitting: Family Medicine

## 2013-02-13 NOTE — Telephone Encounter (Signed)
Closing the note until they call back

## 2013-03-06 ENCOUNTER — Ambulatory Visit (INDEPENDENT_AMBULATORY_CARE_PROVIDER_SITE_OTHER): Payer: Managed Care, Other (non HMO) | Admitting: Internal Medicine

## 2013-03-06 ENCOUNTER — Ambulatory Visit: Payer: Managed Care, Other (non HMO) | Admitting: Internal Medicine

## 2013-03-06 ENCOUNTER — Encounter: Payer: Self-pay | Admitting: Internal Medicine

## 2013-03-06 VITALS — BP 104/68 | HR 60 | Ht 59.0 in | Wt 116.0 lb

## 2013-03-06 DIAGNOSIS — K589 Irritable bowel syndrome without diarrhea: Secondary | ICD-10-CM

## 2013-03-06 DIAGNOSIS — K219 Gastro-esophageal reflux disease without esophagitis: Secondary | ICD-10-CM

## 2013-03-06 DIAGNOSIS — K649 Unspecified hemorrhoids: Secondary | ICD-10-CM

## 2013-03-06 NOTE — Progress Notes (Signed)
HISTORY OF PRESENT ILLNESS:  Danielle Sims is a 32 y.o. female who was initially evaluated 02/02/2013 with chronic GI complaints. See that dictation. She was felt to have GERD, alternating IBS, and hemorrhoids. CBC and conference a metabolic panel were unremarkable. Abdominal ultrasound in June was unremarkable except for hepatic steatosis. She was prescribed omeprazole and Levsin sublingual as well as medicated suppositories. Upper endoscopy was performed 02/04/2013. This was unremarkable. Biopsy for H. pylori was negative. She presents today for scheduled followup. She is please report that her reflux symptoms have resolved. She has not needed Levsin for abdominal discomfort. No issues with hemorrhoids. Some transient nausea but no other complaints. She is pleased. Some questions regarding PPI and B12 deficiency  REVIEW OF SYSTEMS:  All non-GI ROS negative   Past Medical History  Diagnosis Date  . Migraines   . Bronchitis, acute 01/18/2011  . Gastroenteritis 05/31/2011  . Suprapubic pain 05/31/2011  . Cervical cancer screening 07/12/2011  . Rectal bleeding 07/13/2011  . Headache(784.0) 07/13/2011  . Allergic state 07/13/2011  . Bronchitis 07/16/2011  . LUQ pain 08/27/2011  . Anxiety attack 11/13/2011  . History of motion sickness 11/29/2011  . Chest pain, atypical 11/29/2011  . Folliculitis 11/29/2011  . 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 Urticaria 01/11/2012  . Appendicitis   . Other and unspecified ovarian cysts   . GERD (gastroesophageal reflux disease)   . H/O Bell's palsy   . 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 Gonorrhea 02/08/2013  . Preventative health care 02/08/2013    Past Surgical History  Procedure Laterality Date  . Rhinoplasty      at 32 yo  . Cheek bones corrected      at 32 yo  . Breast enhancement surgery  2008  . Appendectomy  2006    Enlarged  . Upper gastrointestinal endoscopy      Social History Danielle Sims  reports that she has never smoked. She has never used smokeless  tobacco. She reports that  drinks alcohol. She reports that she does not use illicit drugs.  family history includes Arthritis in her father; Breast cancer in her maternal aunt; Cancer in her maternal grandfather; Colon cancer in her maternal grandfather; Fibrocystic breast disease in her mother; Fibroids in her mother; Glaucoma in her maternal grandmother; Heart disease in her father and mother; Hyperlipidemia in her maternal grandmother and mother; Kidney disease in her maternal grandmother; Osteoarthritis in her maternal grandmother; Other in her father and mother; Rheum arthritis in her mother; Stroke in her mother.  Allergies  Allergen Reactions  . Iodinated Diagnostic Agents Hives    Pt developed 2 hives around site of injection Lt antecubital that were itchy after leaving lobby post scan. Given 50 mg oral benadryl.        PHYSICAL EXAMINATION: Vital signs: BP 104/68  Pulse 60  Ht 4\' 11"  (1.499 m)  Wt 116 lb (52.617 kg)  BMI 23.42 kg/m2  LMP 02/13/2013 General: Well-developed, well-nourished, no acute distress HEENT: Sclerae are anicteric, conjunctiva pink. Oral mucosa intact Lungs: Clear Heart: Regular Abdomen: soft, nontender, nondistended, no obvious ascites, no peritoneal signs, normal bowel sounds. No organomegaly. Extremities: No edema Psychiatric: alert and oriented x3. Cooperative     ASSESSMENT:  #1. GERD. Symptoms improved on PPI #2. IBS. Alternating. Doing well currently #3. Hemorrhoids. Not a problem at this time   PLAN:  #1. Continue reflux precautions #2. Continue PPI #3. Levsin sublingual as needed #4. Medicated suppositories as needed #5. Return to the care of your  PCP. GI followup as needed

## 2013-03-06 NOTE — Patient Instructions (Addendum)
Please follow up with Dr. Perry as needed 

## 2013-03-13 ENCOUNTER — Ambulatory Visit (INDEPENDENT_AMBULATORY_CARE_PROVIDER_SITE_OTHER): Payer: Managed Care, Other (non HMO) | Admitting: Family Medicine

## 2013-03-13 ENCOUNTER — Ambulatory Visit (HOSPITAL_BASED_OUTPATIENT_CLINIC_OR_DEPARTMENT_OTHER)
Admission: RE | Admit: 2013-03-13 | Discharge: 2013-03-13 | Disposition: A | Discharge status: Home / Self Care | Payer: Managed Care, Other (non HMO) | Source: Ambulatory Visit | Attending: Family Medicine | Admitting: Family Medicine

## 2013-03-13 ENCOUNTER — Encounter: Payer: Self-pay | Admitting: Family Medicine

## 2013-03-13 ENCOUNTER — Telehealth: Payer: Self-pay | Admitting: Family Medicine

## 2013-03-13 VITALS — BP 120/72 | HR 74 | Temp 98.7°F | Ht 59.0 in | Wt 116.0 lb

## 2013-03-13 DIAGNOSIS — R946 Abnormal results of thyroid function studies: Secondary | ICD-10-CM | POA: Insufficient documentation

## 2013-03-13 DIAGNOSIS — R768 Other specified abnormal immunological findings in serum: Secondary | ICD-10-CM

## 2013-03-13 DIAGNOSIS — R1310 Dysphagia, unspecified: Secondary | ICD-10-CM | POA: Insufficient documentation

## 2013-03-13 DIAGNOSIS — R6889 Other general symptoms and signs: Secondary | ICD-10-CM

## 2013-03-13 DIAGNOSIS — Z0000 Encounter for general adult medical examination without abnormal findings: Secondary | ICD-10-CM

## 2013-03-13 DIAGNOSIS — R7989 Other specified abnormal findings of blood chemistry: Secondary | ICD-10-CM

## 2013-03-13 DIAGNOSIS — R5381 Other malaise: Secondary | ICD-10-CM

## 2013-03-13 DIAGNOSIS — R894 Abnormal immunological findings in specimens from other organs, systems and tissues: Secondary | ICD-10-CM

## 2013-03-13 LAB — TSH: TSH: 1.774 u[IU]/mL (ref 0.350–4.500)

## 2013-03-13 NOTE — Progress Notes (Signed)
Quick Note:  Patient Informed and voiced understanding ______ 

## 2013-03-13 NOTE — Telephone Encounter (Signed)
Labs prior or at [facility], freet[facility], liver, renal [facility]  Patient has an appointment in December/2014 and will be going to [facility] lab

## 2013-03-13 NOTE — Patient Instructions (Addendum)

## 2013-03-15 ENCOUNTER — Encounter: Payer: Self-pay | Admitting: Family Medicine

## 2013-03-15 DIAGNOSIS — R946 Abnormal results of thyroid function studies: Secondary | ICD-10-CM

## 2013-03-15 HISTORY — DX: Abnormal results of thyroid function studies: R94.6

## 2013-03-15 NOTE — Progress Notes (Signed)
Patient ID: Danielle Sims, female   DOB: [birth year], 32 y.o.   MRN: [id number] Marizol Borror [id number] Oct 17, 1980 03/15/2013      Progress Note-Follow Up  Subjective  Chief Complaint  Chief Complaint  Patient presents with  . Follow-up    discuss lab results and T4 bloodwork    HPI  Patient is a 32 year old female who is in today for repeat lab work. 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 Take in adequate sleep and meals at times. Denies ever having had any HSV lesions but is here to discuss lab work today no recent illness. No fevers, chills, chest pain, palpitations, shortness of breath, GI or GU concerns noted today.  Past Medical History  Diagnosis Date  . Migraines   . Bronchitis, acute 01/18/2011  . Gastroenteritis 05/31/2011  . Suprapubic pain 05/31/2011  . Cervical cancer screening 07/12/2011  . Rectal bleeding 07/13/2011  . Headache(784.0) 07/13/2011  . Allergic state 07/13/2011  . Bronchitis 07/16/2011  . LUQ pain 08/27/2011  . Anxiety attack 11/13/2011  . History of motion sickness 11/29/2011  . Chest pain, atypical 11/29/2011  . Folliculitis 11/29/2011  . 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 Urticaria 01/11/2012  . Appendicitis   . Other and unspecified ovarian cysts   . GERD (gastroesophageal reflux disease)   . H/O Bell's palsy   . 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 Gonorrhea 02/08/2013  . Preventative health care 02/08/2013  . Abnormal thyroid function test 03/15/2013    Past Surgical History  Procedure Laterality Date  . Rhinoplasty      at 32 yo  . Cheek bones corrected      at 32 yo  . Breast enhancement surgery  2008  . Appendectomy  2006    Enlarged  . Upper gastrointestinal endoscopy      Family History  Problem Relation Age of Onset  . Stroke Mother   . Heart disease Mother   . Hyperlipidemia Mother   . Rheum arthritis Mother   . Fibroids Mother     Uterine  . Fibrocystic breast disease Mother    . Other Mother     gastritis  . Hyperlipidemia Maternal Grandmother   . Osteoarthritis Maternal Grandmother   . Glaucoma Maternal Grandmother   . Kidney disease Maternal Grandmother   . Cancer Maternal Grandfather     Skin, prostate, throat, stomach  . Colon cancer Maternal Grandfather   . Breast cancer Maternal Aunt   . Other Father     gastritis  . Heart disease Father     arteriosclerosis  . Arthritis Father     femoral head necrosis b/l    History   Social History  . Marital Status: Married    Spouse Name: N/A    Number of Children: 1  . Years of Education: N/A   Occupational History  . CMA    Social History Main Topics  . Smoking status: Never Smoker   . Smokeless tobacco: Never Used  . Alcohol Use: 0.0 oz/week    0 drink(s) per week     Comment: ocassional-2-3 weekly  . Drug Use: No  . Sexual Activity: Yes    Partners: Male   Other Topics Concern  . Not on file   Social History Narrative  . No narrative on file    Current Outpatient Prescriptions on File Prior to Visit  Medication Sig Dispense Refill  . aspirin-acetaminophen-caffeine (EXCEDRIN MIGRAINE) 250-250-65 MG per tablet  Take 1 tablet by mouth every 6 (six) hours as needed. For migraine      . diphenhydrAMINE (BENADRYL) 25 MG tablet Take 25 mg by mouth at bedtime as needed. For allergies      . hydrocortisone (ANUSOL-HC) 25 MG suppository Place 1 suppository (25 mg total) rectally at bedtime as needed for hemorrhoids.  30 suppository  3  . hyoscyamine (LEVSIN SL) 0.125 MG SL tablet 1-2 tablets sublingually every 4 hours as needed for pain  30 tablet  6  . Lactobacillus (DIGESTIVE HEALTH PROBIOTIC PO) Take 1 capsule by mouth daily.      . montelukast (SINGULAIR) 10 MG tablet Take 10 mg by mouth daily as needed. For alleriges      . olopatadine (PATANOL) 0.1 % ophthalmic solution Place 1 drop into both eyes daily as needed. For allergies      . omeprazole (PRILOSEC) 20 MG capsule Take 1 capsule (20  mg total) by mouth daily.  90 capsule  1   No current facility-administered medications on file prior to visit.    Allergies  Allergen Reactions  . Iodinated Diagnostic Agents Hives    Pt developed 2 hives around site of injection Lt antecubital that were itchy after leaving lobby post scan. Given 50 mg oral benadryl.     Review of Systems  Review of Systems  Constitutional: Positive for malaise/fatigue. Negative for fever.  HENT: Negative for congestion.   Eyes: Negative for discharge.  Respiratory: Negative for shortness of breath.   Cardiovascular: Negative for chest pain, palpitations and leg swelling.  Gastrointestinal: Negative for nausea, abdominal pain and diarrhea.  Genitourinary: Negative for dysuria.  Musculoskeletal: Negative for falls.  Skin: Negative for rash.  Neurological: Negative for loss of consciousness and headaches.  Endo/Heme/Allergies: Negative for polydipsia.  Psychiatric/Behavioral: Negative for depression and suicidal ideas. The patient is not nervous/anxious and does not have insomnia.     Objective  BP 120/72  Pulse 74  Temp(Src) 98.7 F (37.1 C) (Oral)  Ht 4\' 11"  (1.499 m)  Wt 116 lb (52.617 kg)  BMI 23.42 kg/m2  SpO2 98%  LMP 02/13/2013  Physical Exam  Physical Exam  Constitutional: She is oriented to person, place, and time and well-developed, well-nourished, and in no distress. No distress.  HENT:  Head: Normocephalic and atraumatic.  Eyes: Conjunctivae are normal.  Neck: Neck supple. No thyromegaly present.  Cardiovascular: Normal rate, regular rhythm and normal heart sounds.   No murmur heard. Pulmonary/Chest: Effort normal and breath sounds normal. She has no wheezes.  Abdominal: She exhibits no distension and no mass.  Musculoskeletal: She exhibits no edema.  Lymphadenopathy:    She has no cervical adenopathy.  Neurological: She is alert and oriented to person, place, and time.  Skin: Skin is warm and dry. No rash noted. She  is not diaphoretic.  Psychiatric: Memory, affect and judgment normal.    Lab Results  Component Value Date   TSH 1.774 03/13/2013   Lab Results  Component Value Date   WBC 9.8 02/03/2013   HGB 13.5 02/03/2013   HCT 39.6 02/03/2013   MCV 86.1 02/03/2013   PLT 351 02/03/2013   Lab Results  Component Value Date   CREATININE 0.6 12/24/2012   BUN 14 12/24/2012   NA 134* 12/24/2012   K 3.7 12/24/2012   CL 102 12/24/2012   CO2 25 12/24/2012   Lab Results  Component Value Date   ALT 32 02/03/2013   AST 22 02/03/2013   ALKPHOS  65 02/03/2013   BILITOT 0.4 02/03/2013   Lab Results  Component Value Date   CHOL 169 07/19/2011   Lab Results  Component Value Date   HDL 52.40 07/19/2011   Lab Results  Component Value Date   LDLCALC 92 07/19/2011   Lab Results  Component Value Date   TRIG 123.0 07/19/2011   Lab Results  Component Value Date   CHOLHDL 3 07/19/2011     Assessment & Plan  HSV-2 seropositive Patient denies a h/o active lesions, she is aware ot contact us if one develops and to use a barrier method.  Abnormal thyroid function test Despite patient fatigue, labs on repeat today are normal will repeat labs in 8-12 weeks. Encouraged adequate sleep, meals and exercise

## 2013-03-15 NOTE — Assessment & Plan Note (Signed)
Despite patient fatigue, labs on repeat today are normal will repeat labs in 8-12 weeks. Encouraged adequate sleep, meals and exercise

## 2013-03-15 NOTE — Assessment & Plan Note (Signed)
Patient denies a h/o active lesions, she is aware ot contact us if one develops and to use a barrier method.

## 2013-04-23 ENCOUNTER — Encounter: Payer: Self-pay | Admitting: Family Medicine

## 2013-04-23 ENCOUNTER — Ambulatory Visit (INDEPENDENT_AMBULATORY_CARE_PROVIDER_SITE_OTHER): Payer: Managed Care, Other (non HMO) | Admitting: Family Medicine

## 2013-04-23 VITALS — BP 100/60 | HR 60 | Temp 99.0°F | Ht 59.0 in | Wt 113.0 lb

## 2013-04-23 DIAGNOSIS — G510 Bell's palsy: Secondary | ICD-10-CM

## 2013-04-23 DIAGNOSIS — K529 Noninfective gastroenteritis and colitis, unspecified: Secondary | ICD-10-CM

## 2013-04-23 DIAGNOSIS — R109 Unspecified abdominal pain: Secondary | ICD-10-CM

## 2013-04-23 DIAGNOSIS — R110 Nausea: Secondary | ICD-10-CM

## 2013-04-23 DIAGNOSIS — K5289 Other specified noninfective gastroenteritis and colitis: Secondary | ICD-10-CM

## 2013-04-23 LAB — COMPREHENSIVE METABOLIC PANEL
ALT: 18 U/L (ref 0–35)
Albumin: 4.3 g/dL (ref 3.5–5.2)
BUN: 11 mg/dL (ref 6–23)
CO2: 28 mEq/L (ref 19–32)
Calcium: 9.6 mg/dL (ref 8.4–10.5)
Chloride: 102 mEq/L (ref 96–112)
Creat: 0.7 mg/dL (ref 0.50–1.10)
Glucose, Bld: 81 mg/dL (ref 70–99)
Total Bilirubin: 0.6 mg/dL (ref 0.3–1.2)

## 2013-04-23 LAB — CBC
HCT: 40 % (ref 36.0–46.0)
Hemoglobin: 13.4 g/dL (ref 12.0–15.0)
MCH: 28.9 pg (ref 26.0–34.0)
MCV: 86.4 fL (ref 78.0–100.0)
Platelets: 375 10*3/uL (ref 150–400)
RBC: 4.63 MIL/uL (ref 3.87–5.11)
RDW: 14.5 % (ref 11.5–15.5)
WBC: 10.9 10*3/uL — ABNORMAL HIGH (ref 4.0–10.5)

## 2013-04-23 MED ORDER — ACYCLOVIR 800 MG PO TABS: 800.0000 mg | ORAL_TABLET | Freq: Three times a day (TID) | ORAL | Status: DC

## 2013-04-23 MED ORDER — MONTELUKAST SODIUM 10 MG PO TABS: 10.0000 mg | ORAL_TABLET | Freq: Every day | ORAL | Status: DC | PRN

## 2013-04-23 MED ORDER — ONDANSETRON 4 MG PO TBDP: 4.0000 mg | ORAL_TABLET | Freq: Three times a day (TID) | ORAL | Status: DC | PRN

## 2013-04-23 MED ORDER — METHYLPREDNISOLONE 4 MG PO KIT: PACK | ORAL | Status: AC

## 2013-04-23 NOTE — Patient Instructions (Signed)
Increase fluids, Advance BRAT (bananas, rice, applesauce, toast)   Norovirus Infection Norovirus illness is caused by a viral infection. The term norovirus refers to a group of viruses. Any of those viruses can cause norovirus illness. This illness is often referred to by other names such as viral gastroenteritis, stomach flu, and food poisoning. Anyone can get a norovirus infection. People can have the illness multiple times during their lifetime. CAUSES  Norovirus is found in the stool or vomit of infected people. It is easily spread from person to person (contagious). People with norovirus are contagious from the moment they begin feeling ill. They may remain contagious for as long as 3 days to 2 weeks after recovery. People can become infected with the virus in several ways. This includes:  Eating food or drinking liquids that are contaminated with norovirus.  Touching surfaces or objects contaminated with norovirus, and then placing your hand in your mouth.  Having direct contact with a person who is infected and shows symptoms. This may occur while caring for someone with illness or while sharing foods or eating utensils with someone who is ill. SYMPTOMS  Symptoms usually begin 1 to 2 days after ingestion of the virus. Symptoms may include:  Nausea.  Vomiting.  Diarrhea.  Stomach cramps.  Low-grade fever.  Chills.  Headache.  Muscle aches.  Tiredness. Most people with norovirus illness get better within 1 to 2 days. Some people become dehydrated because they cannot drink enough liquids to replace those lost from vomiting and diarrhea. This is especially true for young children, the elderly, and others who are unable to care for themselves. DIAGNOSIS  Diagnosis is based on your symptoms and exam. Currently, only state public health laboratories have the ability to test for norovirus in stool or vomit. TREATMENT  No specific treatment exists for norovirus infections. No  vaccine is available to prevent infections. Norovirus illness is usually brief in healthy people. If you are ill with vomiting and diarrhea, you should drink enough water and fluids to keep your urine clear or pale yellow. Dehydration is the most serious health effect that can result from this infection. By drinking oral rehydration solution (ORS), people can reduce their chance of becoming dehydrated. There are many commercially available pre-made and powdered ORS designed to safely rehydrate people. These may be recommended by your caregiver. Replace any new fluid losses from diarrhea or vomiting with ORS as follows:  If your child weighs 10 kg or less (22 lb or less), give 60 to 120 ml ( to  cup or 2 to 4 oz) of ORS for each diarrheal stool or vomiting episode.  If your child weighs more than 10 kg (more than 22 lb), give 120 to 240 ml ( to 1 cup or 4 to 8 oz) of ORS for each diarrheal stool or vomiting episode. HOME CARE INSTRUCTIONS   Follow all your caregiver's instructions.  Avoid sugar-free and alcoholic drinks while ill.  Only take over-the-counter or prescription medicines for pain, vomiting, diarrhea, or fever as directed by your caregiver. You can decrease your chances of coming in contact with norovirus or spreading it by following these steps:  Frequently wash your hands, especially after using the toilet, changing diapers, and before eating or preparing food.  Carefully wash fruits and vegetables. Cook shellfish before eating them.  Do not prepare food for others while you are infected and for at least 3 days after recovering from illness.  Thoroughly clean and disinfect contaminated surfaces immediately after  an episode of illness using a bleach-based household cleaner.  Immediately remove and wash clothing or linens that may be contaminated with the virus.  Use the toilet to dispose of any vomit or stool. Make sure the surrounding area is kept clean.  Food that may  have been contaminated by an ill person should be discarded. SEEK IMMEDIATE MEDICAL CARE IF:   You develop symptoms of dehydration that do not improve with fluid replacement. This may include:  Excessive sleepiness.  Lack of tears.  Dry mouth.  Dizziness when standing.  Weak pulse. Document Released: 09/08/2002 Document Revised: 09/10/2011 Document Reviewed: 10/10/2009 [facility] Patient Information 2014 [place], [place].

## 2013-04-24 LAB — URINALYSIS
Bilirubin Urine: NEGATIVE
Glucose, UA: NEGATIVE mg/dL
Hgb urine dipstick: NEGATIVE
Ketones, ur: NEGATIVE mg/dL
Protein, ur: NEGATIVE mg/dL
Urobilinogen, UA: 0.2 mg/dL (ref 0.0–1.0)

## 2013-04-26 ENCOUNTER — Encounter: Payer: Self-pay | Admitting: Family Medicine

## 2013-04-26 NOTE — Progress Notes (Signed)
Patient ID: Danielle Sims, female   DOB: 09/21/1980, 32 y.o.   MRN: [id number] Danielle Sims [id number] 1980/08/28 04/26/2013      Progress Note-Follow Up  Subjective  Chief Complaint  Chief Complaint  Patient presents with  . Abdominal Pain    upperand lower pains that started around 2 AM. diarrhea with some body aches and nausea.     HPI  Patient is a 32 year old female who is in today complaining of sudden onset of diarrhea overnight. Last night she went to bed with a mild sore throat and some myalgias and then woke up very suddenly at 2 AM with abdominal cramping and diarrhea. Since then she's been struggling with malaise and myalgias abdominal cramping, nausea and one episode of vomiting. She's had no bloody or tarry stool. Has taken numerous hyoscyamine and omeprazole with minimal relief. No further diarrhea this morning. No chest pain or palpitations. No shortness of breath or high-grade fevers.  Past Medical History  Diagnosis Date  . Migraines   . Bronchitis, acute 01/18/2011  . Gastroenteritis 05/31/2011  . Suprapubic pain 05/31/2011  . Cervical cancer screening 07/12/2011  . Rectal bleeding 07/13/2011  . Headache(784.0) 07/13/2011  . Allergic state 07/13/2011  . Bronchitis 07/16/2011  . LUQ pain 08/27/2011  . Anxiety attack 11/13/2011  . History of motion sickness 11/29/2011  . Chest pain, atypical 11/29/2011  . Folliculitis 11/29/2011  . 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 Urticaria 01/11/2012  . Appendicitis   . Other and unspecified ovarian cysts   . GERD (gastroesophageal reflux disease)   . H/O Bell's palsy   . 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 Gonorrhea 02/08/2013  . Preventative health care 02/08/2013  . Abnormal thyroid function test 03/15/2013    Past Surgical History  Procedure Laterality Date  . Rhinoplasty      at 32 yo  . Cheek bones corrected      at 32 yo  . Breast enhancement surgery  2008  . Appendectomy  2006    Enlarged  . Upper gastrointestinal endoscopy       Family History  Problem Relation Age of Onset  . Stroke Mother   . Heart disease Mother   . Hyperlipidemia Mother   . Rheum arthritis Mother   . Fibroids Mother     Uterine  . Fibrocystic breast disease Mother   . Other Mother     gastritis  . Hyperlipidemia Maternal Grandmother   . Osteoarthritis Maternal Grandmother   . Glaucoma Maternal Grandmother   . Kidney disease Maternal Grandmother   . Cancer Maternal Grandfather     Skin, prostate, throat, stomach  . Colon cancer Maternal Grandfather   . Breast cancer Maternal Aunt   . Other Father     gastritis  . Heart disease Father     arteriosclerosis  . Arthritis Father     femoral head necrosis b/l    History   Social History  . Marital Status: Married    Spouse Name: N/A    Number of Children: 1  . Years of Education: N/A   Occupational History  . CMA    Social History Main Topics  . Smoking status: Never Smoker   . Smokeless tobacco: Never Used  . Alcohol Use: 0.0 oz/week    0 drink(s) per week     Comment: ocassional-2-3 weekly  . Drug Use: No  . Sexual Activity: Yes    Partners: Male   Other Topics Concern  . Not on file   Social  History Narrative  . No narrative on file    Current Outpatient Prescriptions on File Prior to Visit  Medication Sig Dispense Refill  . aspirin-acetaminophen-caffeine (EXCEDRIN MIGRAINE) 250-250-65 MG per tablet Take 1 tablet by mouth every 6 (six) hours as needed. For migraine      . diphenhydrAMINE (BENADRYL) 25 MG tablet Take 25 mg by mouth at bedtime as needed. For allergies      . hydrocortisone (ANUSOL-HC) 25 MG suppository Place 1 suppository (25 mg total) rectally at bedtime as needed for hemorrhoids.  30 suppository  3  . hyoscyamine (LEVSIN SL) 0.125 MG SL tablet 1-2 tablets sublingually every 4 hours as needed for pain  30 tablet  6  . Lactobacillus (DIGESTIVE HEALTH PROBIOTIC PO) Take 1 capsule by mouth daily.      Marland Kitchen olopatadine (PATANOL) 0.1 % ophthalmic  solution Place 1 drop into both eyes daily as needed. For allergies      . omeprazole (PRILOSEC) 20 MG capsule Take 1 capsule (20 mg total) by mouth daily.  90 capsule  1   No current facility-administered medications on file prior to visit.    Allergies  Allergen Reactions  . Iodinated Diagnostic Agents Hives    Pt developed 2 hives around site of injection Lt antecubital that were itchy after leaving lobby post scan. Given 50 mg oral benadryl.     Review of Systems  Review of Systems  Constitutional: Positive for malaise/fatigue. Negative for fever.  HENT: Positive for sore throat. Negative for congestion.   Eyes: Negative for discharge.  Respiratory: Positive for sputum production. Negative for shortness of breath.   Cardiovascular: Negative for chest pain, palpitations and leg swelling.  Gastrointestinal: Positive for nausea, vomiting, abdominal pain and diarrhea.  Genitourinary: Negative for dysuria.  Musculoskeletal: Positive for myalgias. Negative for falls.  Skin: Negative for rash.  Neurological: Negative for loss of consciousness and headaches.  Endo/Heme/Allergies: Negative for polydipsia.  Psychiatric/Behavioral: Negative for depression and suicidal ideas. The patient is not nervous/anxious and does not have insomnia.     Objective  BP 100/60  Pulse 60  Temp(Src) 99 F (37.2 C) (Oral)  Ht 4\' 11"  (1.499 m)  Wt 113 lb (51.256 kg)  BMI 22.81 kg/m2  SpO2 97%  LMP 03/26/2013  Physical Exam  Physical Exam  Constitutional: She is oriented to person, place, and time and well-developed, well-nourished, and in no distress. No distress.  HENT:  Head: Normocephalic and atraumatic.  Eyes: Conjunctivae are normal.  Neck: Neck supple. No thyromegaly present.  Cardiovascular: Normal rate, regular rhythm and normal heart sounds.   No murmur heard. Pulmonary/Chest: Effort normal and breath sounds normal. She has no wheezes.  Abdominal: She exhibits no distension and no  mass.  Musculoskeletal: She exhibits no edema.  Lymphadenopathy:    She has no cervical adenopathy.  Neurological: She is alert and oriented to person, place, and time.  Skin: Skin is warm and dry. No rash noted. She is not diaphoretic.  Psychiatric: Memory, affect and judgment normal.    Lab Results  Component Value Date   TSH 1.774 03/13/2013   Lab Results  Component Value Date   WBC 10.9* 04/23/2013   HGB 13.4 04/23/2013   HCT 40.0 04/23/2013   MCV 86.4 04/23/2013   PLT 375 04/23/2013   Lab Results  Component Value Date   CREATININE 0.70 04/23/2013   BUN 11 04/23/2013   NA 138 04/23/2013   K 4.5 04/23/2013   CL 102 04/23/2013  CO2 28 04/23/2013   Lab Results  Component Value Date   ALT 18 04/23/2013   AST 18 04/23/2013   ALKPHOS 66 04/23/2013   BILITOT 0.6 04/23/2013   Lab Results  Component Value Date   CHOL 169 07/19/2011   Lab Results  Component Value Date   HDL 52.40 07/19/2011   Lab Results  Component Value Date   LDLCALC 92 07/19/2011   Lab Results  Component Value Date   TRIG 123.0 07/19/2011   Lab Results  Component Value Date   CHOLHDL 3 07/19/2011     Assessment & Plan  Gastroenteritis Encouraged clear fluids with gatorade and ginger ale then slowly progress to [employer] and as tolerated. Report worsening symptoms. Given Ondansetron ODT to use prn

## 2013-04-26 NOTE — Assessment & Plan Note (Addendum)
Encouraged clear fluids with gatorade and ginger ale then slowly progress to [employer] and as tolerated. Report worsening symptoms. Given Ondansetron ODT to use prn

## 2013-04-29 ENCOUNTER — Telehealth: Payer: Self-pay | Admitting: *Deleted

## 2013-04-29 NOTE — Telephone Encounter (Signed)
This is not unusual after diarrhea. I recommend 4 oz of warm prune juice with 2 tbls milk of magnesia, if no results after 4 hours can repeat with a dulcolax suppository per rectum at the same time.

## 2013-04-29 NOTE — Telephone Encounter (Signed)
Notified pt and she voices understanding. 

## 2013-04-29 NOTE — Telephone Encounter (Signed)
Message copied by Kathi Simpers on Wed Apr 29, 2013  4:17 PM ------      Message from: Danielle Sims      Created: Tue Apr 28, 2013  4:08 PM       So if she is still symptomatic then she needs Sims repeat UA and culture ------

## 2013-04-29 NOTE — Telephone Encounter (Signed)
Spoke with pt. States n/v, diarrhea and fever have all resolved since Saturday. Notes that she has had constipation since this past Friday. Has had no bowel movement despite benefiber, probiotics and soft diet. Pt also requests lab results.  Please advise.     Notes Recorded by Kathi Simpers, CMA on 04/28/2013 at 10:36 AM Per Ayesha Rumpf at [place] lab, urine culture would have to be added within 48 hours and specimen is too old at this point. Do we need to ask the pt to return to lab for urine culture? Notes Recorded by Bradd Canary, MD on 04/24/2013 at 9:13 AM Can we add a urine culture for fever, abdominal pain and elevated WBC? Or is it too late?

## 2013-05-29 ENCOUNTER — Encounter: Payer: Self-pay | Admitting: Family Medicine

## 2013-06-12 ENCOUNTER — Ambulatory Visit: Payer: Managed Care, Other (non HMO) | Admitting: Family Medicine

## 2013-06-19 ENCOUNTER — Ambulatory Visit: Payer: Managed Care, Other (non HMO) | Admitting: Family Medicine

## 2013-07-08 ENCOUNTER — Ambulatory Visit: Payer: Managed Care, Other (non HMO) | Admitting: Physician Assistant

## 2013-07-08 DIAGNOSIS — Z0289 Encounter for other administrative examinations: Secondary | ICD-10-CM

## 2013-08-04 ENCOUNTER — Telehealth: Payer: Self-pay

## 2013-08-04 DIAGNOSIS — IMO0001 Reserved for inherently not codable concepts without codable children: Secondary | ICD-10-CM

## 2013-08-04 DIAGNOSIS — R1012 Left upper quadrant pain: Secondary | ICD-10-CM

## 2013-08-04 DIAGNOSIS — K219 Gastro-esophageal reflux disease without esophagitis: Secondary | ICD-10-CM

## 2013-08-04 MED ORDER — OMEPRAZOLE 20 MG PO CPDR: 20.0000 mg | DELAYED_RELEASE_CAPSULE | Freq: Every day | ORAL | Status: DC

## 2013-08-13 NOTE — Telephone Encounter (Signed)
Sent rx for Omeprazole

## 2013-11-01 IMAGING — CT CT CHEST W/ CM
2 of 3 series · 15 of 36 positions shown, 18 images · IV contrast (Omnipaque 300)
Comparison: Chest radiographs 01/10/2012 and 11/13/2011.

CLINICAL DATA: Recent history of Bell's palsy with atypical facial
and posterior chest pain.  Dry cough and sore throat.  Evaluate for
sarcoidosis.

CT CHEST WITH CONTRAST
TECHNIQUE: Multidetector CT imaging of the chest was performed
following the standard protocol during bolus administration of
intravenous contrast.
Contrast: 80mL OMNIPAQUE IOHEXOL 300 MG/ML  SOLN ; shortly after
receiving intravenous contrast, the patient returned with hives at
the site of injection in the antecubital fossa.  Oral Benadryl 50
mg was administered by Dr. Asuruma.

[Series 2: chest routine with · axial · 0.62mm/px · z∈[-250,-25]mm · 12 of 53 slices shown, 15 images]
[im 4/53  mediastinal]
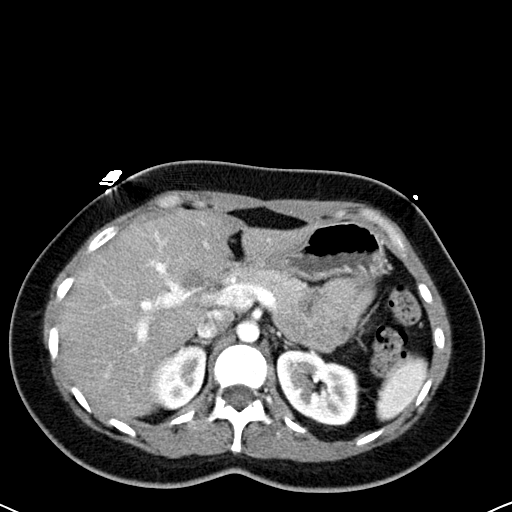
[im 4/53  lung]
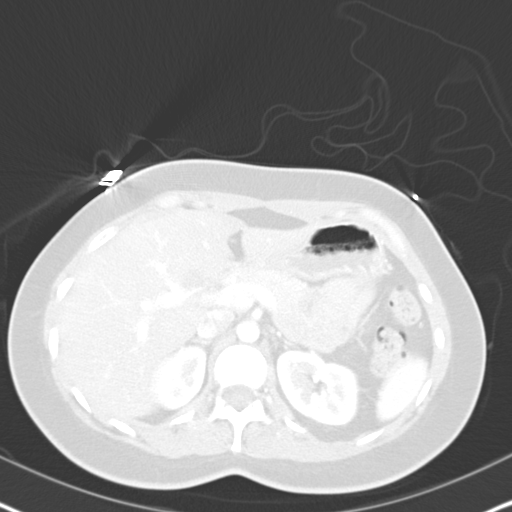
[im 8/53  lung]
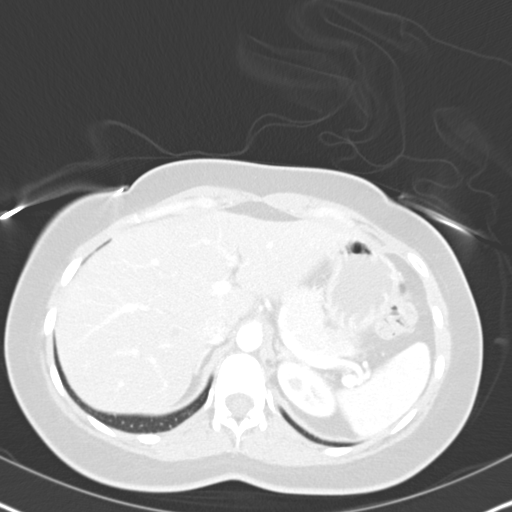
[im 12/53  lung]
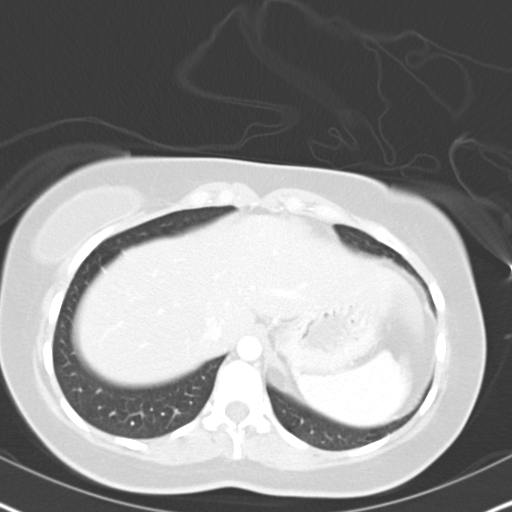
[im 16/53  lung]
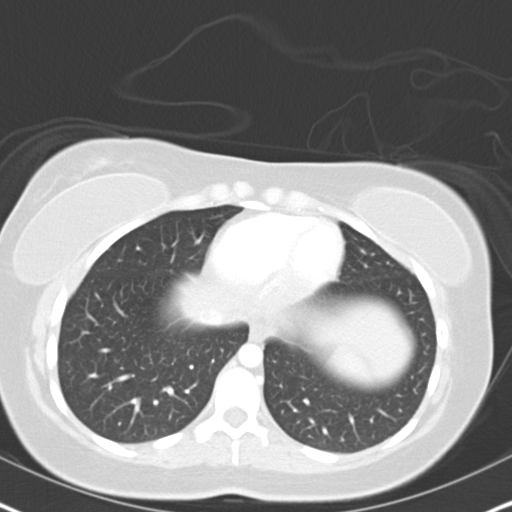
[im 20/53  mediastinal]
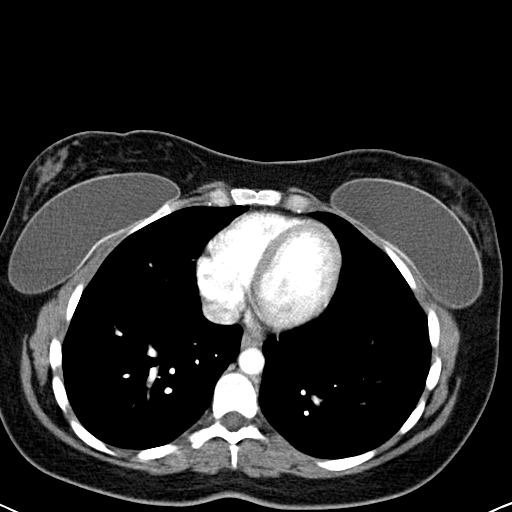
[im 20/53  lung]
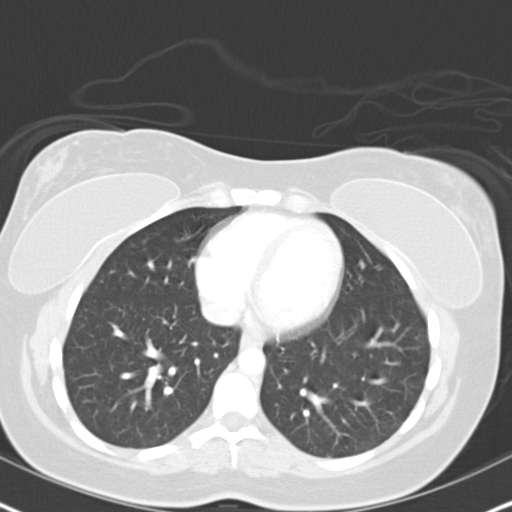
[im 24/53  lung]
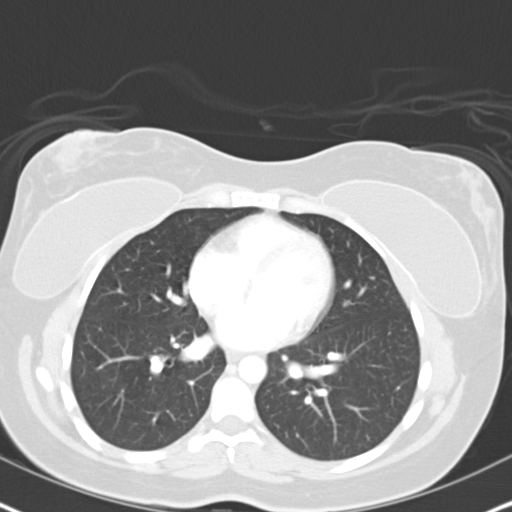
[im 29/53  lung]
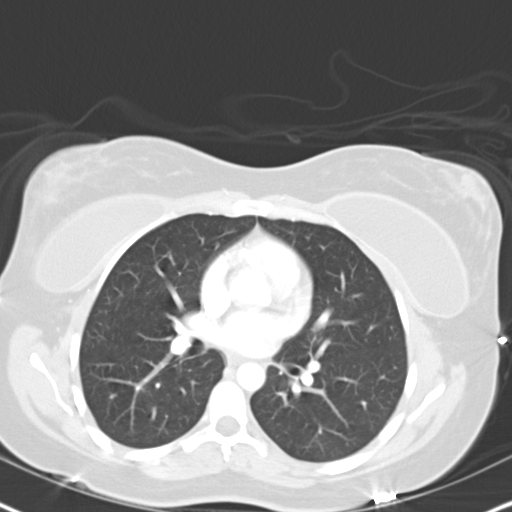
[im 33/53  lung]
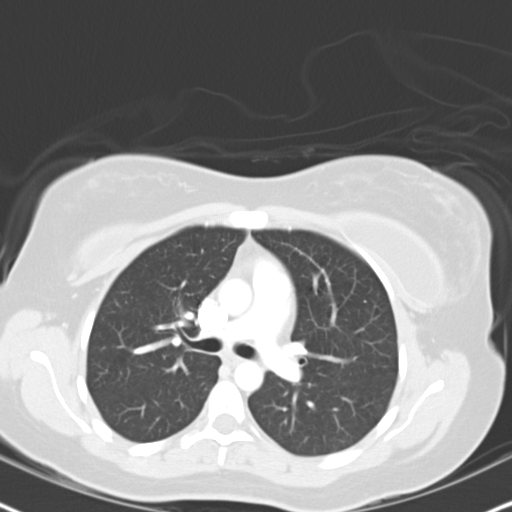
[im 37/53  mediastinal]
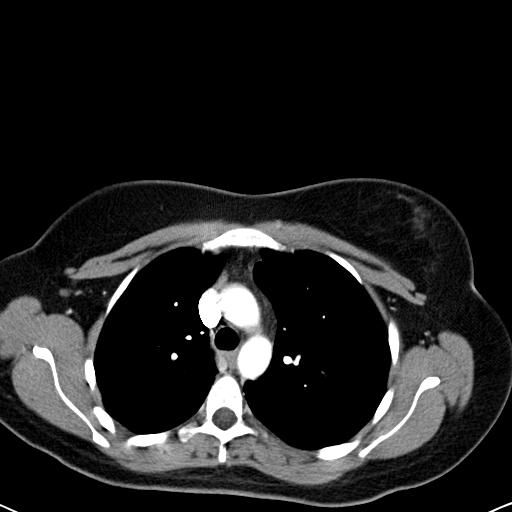
[im 37/53  lung]
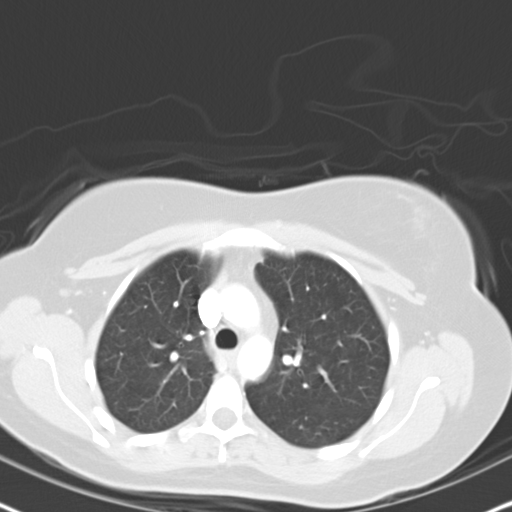
[im 41/53  lung]
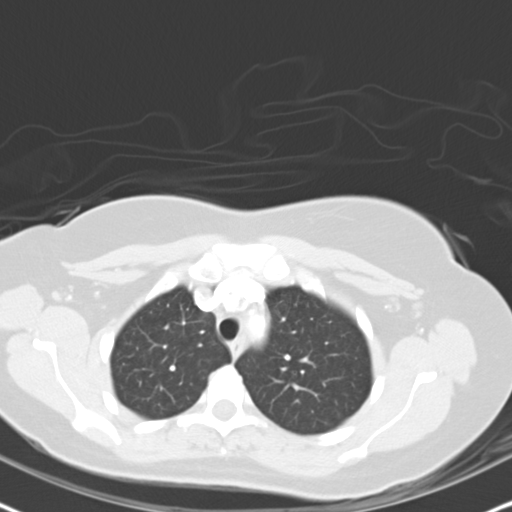
[im 45/53  lung]
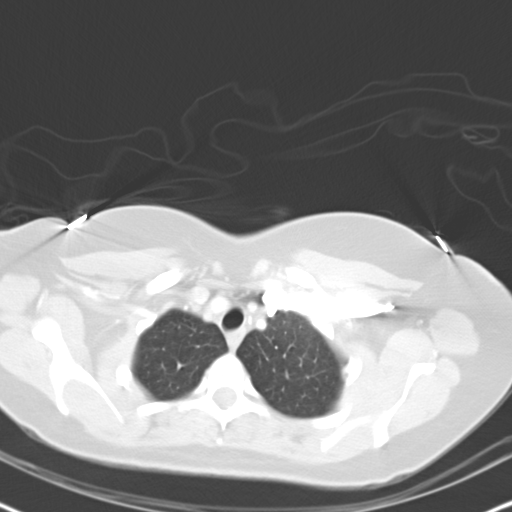
[im 49/53  lung]
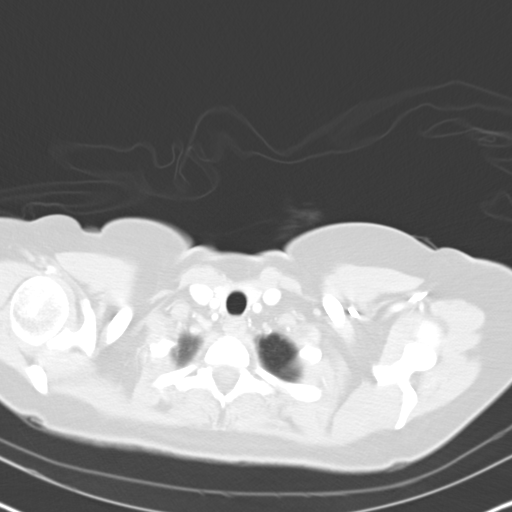

[Series 602: cor · coronal · 0.62mm/px · 3 of 89 slices shown]
[im 18/89  lung]
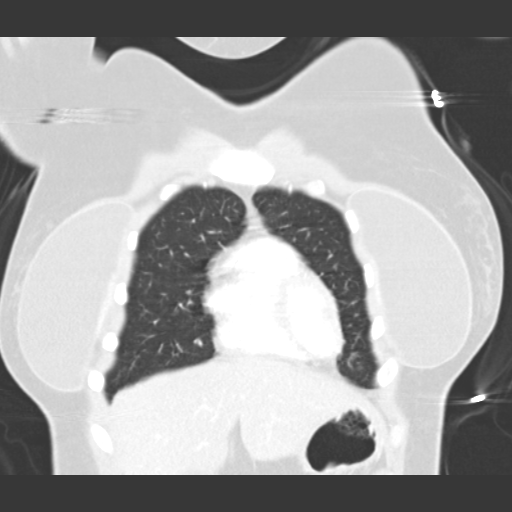
[im 36/89  lung]
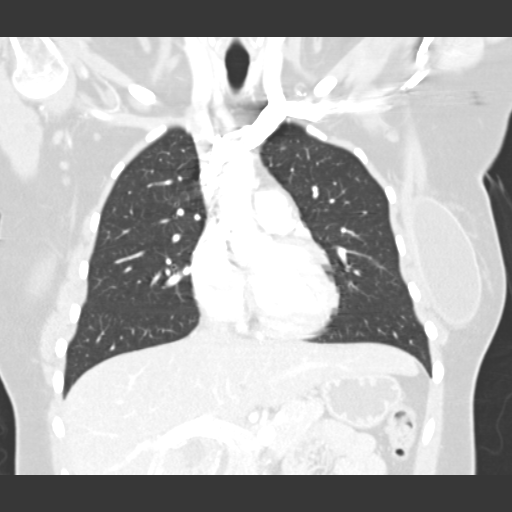
[im 53/89  lung]
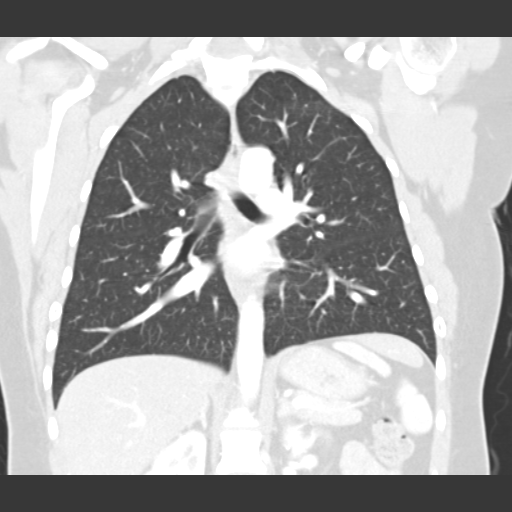

[15 of 36 positions shown; findings below may reference images not displayed]

FINDINGS: There are no enlarged mediastinal, hilar or axillary
lymph nodes.  There is no pleural or pericardial effusion.

The lungs are clear.  There is no endobronchial lesion or
thickening of the bronchovascular bundles.

Bilateral breast implants are noted.  There is a nonspecific 7 mm
lesion in the right hepatic lobe on image 46.  This demonstrates
low density and well circumscribed margins and is probably a cyst.
The spleen appears normal. The osseous structures appear normal.
IMPRESSION: 1.  No CT evidence of sarcoidosis.
2.  No acute cardiopulmonary process.
3.  Small low density hepatic lesion, likely an incidental cyst.

## 2014-01-13 ENCOUNTER — Other Ambulatory Visit: Payer: Self-pay | Admitting: Family Medicine

## 2014-01-13 NOTE — Telephone Encounter (Signed)
Please inform pt that I sent in 1 90 day supply of medication to the pharmacy but there will be no addt refills until pt is seen. Thanks

## 2014-01-14 NOTE — Telephone Encounter (Signed)
Phone # not working, sent [employer] to patient

## 2014-02-09 ENCOUNTER — Encounter: Payer: Self-pay | Admitting: Family Medicine

## 2014-03-01 ENCOUNTER — Encounter: Payer: Managed Care, Other (non HMO) | Admitting: Family Medicine

## 2014-03-11 ENCOUNTER — Other Ambulatory Visit: Payer: Self-pay | Admitting: Internal Medicine

## 2014-05-05 ENCOUNTER — Telehealth: Payer: Self-pay | Admitting: Family Medicine

## 2014-05-05 NOTE — Telephone Encounter (Signed)
Pt would like to switch to dr kim due to dr blyth is to far. Can I sch?

## 2014-05-06 NOTE — Telephone Encounter (Signed)
Sure. Thanks 

## 2014-05-07 NOTE — Telephone Encounter (Signed)
Pt has been sch

## 2014-07-13 ENCOUNTER — Encounter: Payer: Self-pay | Admitting: Family Medicine

## 2014-07-13 ENCOUNTER — Ambulatory Visit (INDEPENDENT_AMBULATORY_CARE_PROVIDER_SITE_OTHER): Payer: Managed Care, Other (non HMO) | Admitting: Family Medicine

## 2014-07-13 VITALS — BP 118/64 | HR 67 | Temp 98.6°F | Ht 59.0 in | Wt 116.2 lb

## 2014-07-13 DIAGNOSIS — K589 Irritable bowel syndrome without diarrhea: Secondary | ICD-10-CM

## 2014-07-13 DIAGNOSIS — Z3200 Encounter for pregnancy test, result unknown: Secondary | ICD-10-CM

## 2014-07-13 DIAGNOSIS — Z7189 Other specified counseling: Secondary | ICD-10-CM

## 2014-07-13 DIAGNOSIS — K219 Gastro-esophageal reflux disease without esophagitis: Secondary | ICD-10-CM

## 2014-07-13 DIAGNOSIS — Z7689 Persons encountering health services in other specified circumstances: Secondary | ICD-10-CM

## 2014-07-13 DIAGNOSIS — G510 Bell's palsy: Secondary | ICD-10-CM

## 2014-07-13 LAB — POCT URINE PREGNANCY: Preg Test, Ur: NEGATIVE

## 2014-07-13 NOTE — Progress Notes (Signed)
HPI:  Danielle Sims is here to establish care. Used to see Dr. Randel Pigg. Last PCP and physical: reports was seeing Dr. Randel Pigg for physical and reports will need to schedule this  Has the following chronic problems that require follow up and concerns today:  GERD/Nausea/IBS: -followed by GI, had EGD in 2014 -reports worsening gerd despite PPI twice daily for last month -denies: dysphagia, blood in stools, weight loss, vomiting, fevers or malaise -levsin prn, prilosec 20mg  bid -she wants a urine preg  Hx of recurrent bell's palsy, optic neuritis, and HSV: -sees Dr. Katy Fitch in opthomology -saw Dr. Jacelyn Grip in the past with neg MRI, CSF analysis and extensive lab work which was normal -appears was referred to rheumatologist? -reports no symptoms in a long time and Dr. Jacelyn Grip left practice  Migraines: Excedrin several times per week -unchanged in many years, on same treatment for 11 years without worsening or changes  Allergies: -uses benadryl and sinugulair for this   ROS negative for unless reported above: fevers, unintentional weight loss, hearing or vision loss, chest pain, palpitations, struggling to breath, hemoptysis, melena, hematochezia, hematuria, falls, loc, si, thoughts of self harm  Past Medical History  Diagnosis Date  . Migraines   . Bronchitis, acute 01/18/2011  . Gastroenteritis 05/31/2011  . Suprapubic pain 05/31/2011  . Cervical cancer screening 07/12/2011  . Rectal bleeding 07/13/2011  . Headache(784.0) 07/13/2011  . Allergic state 07/13/2011  . Bronchitis 07/16/2011  . LUQ pain 08/27/2011  . Anxiety attack 11/13/2011  . History of motion sickness 11/29/2011  . Chest pain, atypical 11/29/2011  . Folliculitis 1/44/3154  . 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 Urticaria 01/11/2012  . Appendicitis   . Other and unspecified ovarian cysts   . GERD (gastroesophageal reflux disease)   . H/O Bell's palsy   . 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 Gonorrhea 02/08/2013  . Preventative health care  02/08/2013  . Abnormal thyroid function test 03/15/2013  . History of Meniere's disease 09/14/2011  . ANEMIA, PERNICIOUS 06/01/2010    Qualifier: Diagnosis of  By: Charlett Blake MD, Erline Levine       Past Surgical History  Procedure Laterality Date  . Rhinoplasty      at 34 yo  . Cheek bones corrected      at 34 yo  . Breast enhancement surgery  2008  . Appendectomy  2006    Enlarged  . Upper gastrointestinal endoscopy      Family History  Problem Relation Age of Onset  . Stroke Mother   . Heart disease Mother   . Hyperlipidemia Mother   . Rheum arthritis Mother   . Fibroids Mother     Uterine  . Fibrocystic breast disease Mother   . Other Mother     gastritis  . Hyperlipidemia Maternal Grandmother   . Osteoarthritis Maternal Grandmother   . Glaucoma Maternal Grandmother   . Kidney disease Maternal Grandmother   . Cancer Maternal Grandfather     Skin, prostate, throat, stomach  . Colon cancer Maternal Grandfather   . Breast cancer Maternal Aunt   . Other Father     gastritis  . Heart disease Father     arteriosclerosis  . Arthritis Father     femoral head necrosis b/l    History   Social History  . Marital Status: Married    Spouse Name: N/A    Number of Children: 1  . Years of Education: N/A   Occupational History  . CMA    Social History Main  Topics  . Smoking status: Never Smoker   . Smokeless tobacco: Never Used  . Alcohol Use: 0.0 oz/week    0 drink(s) per week     Comment: ocassional-2-3 weekly  . Drug Use: No  . Sexual Activity:    Partners: Male   Other Topics Concern  . 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 FOR ALLERIGES, Disp: 90 tablet, Rfl: 0 olopatadine (PATANOL) 0.1 % ophthalmic solution, Place 1 drop into both eyes daily as needed. For allergies, Disp: , Rfl: ;  omeprazole (PRILOSEC) 20 MG capsule, TAKE 1 CAPSULE (20 MG TOTAL) BY MOUTH DAILY., Disp: 90 capsule, Rfl: 1;  predniSONE (DELTASONE) 20 MG tablet, Take 20 mg by mouth daily as needed., Disp: , Rfl:  acyclovir (ZOVIRAX) 800 MG tablet, Take 1 tablet (800 mg total) by mouth 3 (three) times daily. (Patient not taking: Reported on 07/13/2014), Disp: 21 tablet, Rfl: 1;  aspirin-acetaminophen-caffeine (EXCEDRIN MIGRAINE) 546-270-35 MG per tablet, Take 1 tablet by mouth every 6 (six) hours as needed. For migraine, Disp: , Rfl: ;  diphenhydrAMINE (BENADRYL) 25 MG tablet, Take 25 mg by mouth at bedtime as needed. For allergies, Disp: , Rfl:   EXAM:  Filed Vitals:   07/13/14 1109  BP: 118/64  Pulse: 67  Temp: 98.6 F (37 C)    Body mass index is 23.46 kg/(m^2).  GENERAL: vitals reviewed and listed above, alert, oriented, appears well hydrated and in no acute distress  HEENT: atraumatic, conjunttiva clear, no obvious abnormalities on inspection of external nose and ears  NECK: no obvious masses on inspection  LUNGS: clear to auscultation bilaterally, no wheezes, rales or rhonchi, good air movement  CV: HRRR, no peripheral edema  ABD: BS+, soft, NTTP  MS: moves all extremities without noticeable abnormality  PSYCH: pleasant and cooperative, no obvious depression or anxiety  ASSESSMENT AND PLAN:  Discussed the following assessment and plan:  Encounter to establish care  Gastroesophageal reflux disease, esophagitis presence not  specified -worsening despite bid PPI and she reports she plans to see her gastroenterologist as she is concerned for h. Pylori -advised of testing for this and she plans to see GI to see if repeat EGD advised  Facial paralysis/Bells palsy -? Of, reports no symptoms in a long tim, no longer sees neurologist - reports intermittently took prednisone and acyclovir in the past  IBS (irritable bowel syndrome)  -We reviewed the PMH, PSH, FH, SH, Meds and Allergies. -We provided refills for any medications we will prescribe as needed. -We addressed current concerns per orders and patient instructions. -We have asked for records for pertinent exams, studies, vaccines and notes from previous providers. -We have advised patient to follow up per instructions below.   -Patient advised to return or notify  a doctor immediately if symptoms worsen or persist or new concerns arise.  Patient Instructions  Before You leave: -schedule physical in 3 month -urine preg test  Call your gastroenterologist for an appointment  We recommend the following healthy lifestyle measures: - eat a healthy diet consisting of lots of vegetables, fruits, beans, nuts, seeds, healthy meats such as white chicken and fish and whole grains.  - avoid fried foods, fast food, processed foods, sodas, red meet and other fattening foods.  - get a least 150 minutes of aerobic exercise per week.        Colin Benton R.

## 2014-07-13 NOTE — Progress Notes (Signed)
Pre visit review using our clinic review tool, if applicable. No additional management support is needed unless otherwise documented below in the visit note. 

## 2014-07-13 NOTE — Patient Instructions (Addendum)
Before You leave: -schedule physical in 3 month -urine preg test  Call your gastroenterologist for an appointment  We recommend the following healthy lifestyle measures: - eat a healthy diet consisting of lots of vegetables, fruits, beans, nuts, seeds, healthy meats such as white chicken and fish and whole grains.  - avoid fried foods, fast food, processed foods, sodas, red meet and other fattening foods.  - get a least 150 minutes of aerobic exercise per week.

## 2014-07-15 ENCOUNTER — Ambulatory Visit (INDEPENDENT_AMBULATORY_CARE_PROVIDER_SITE_OTHER): Payer: Managed Care, Other (non HMO) | Admitting: Gastroenterology

## 2014-07-15 ENCOUNTER — Encounter: Payer: Self-pay | Admitting: Gastroenterology

## 2014-07-15 ENCOUNTER — Other Ambulatory Visit (INDEPENDENT_AMBULATORY_CARE_PROVIDER_SITE_OTHER): Payer: Managed Care, Other (non HMO)

## 2014-07-15 VITALS — BP 92/54 | HR 72 | Ht 59.0 in | Wt 118.2 lb

## 2014-07-15 DIAGNOSIS — R110 Nausea: Secondary | ICD-10-CM

## 2014-07-15 DIAGNOSIS — K219 Gastro-esophageal reflux disease without esophagitis: Secondary | ICD-10-CM

## 2014-07-15 DIAGNOSIS — R112 Nausea with vomiting, unspecified: Secondary | ICD-10-CM

## 2014-07-15 LAB — H. PYLORI ANTIBODY, IGG: H PYLORI IGG: NEGATIVE

## 2014-07-15 MED ORDER — PANTOPRAZOLE SODIUM 40 MG PO TBEC: 40.0000 mg | DELAYED_RELEASE_TABLET | Freq: Every day | ORAL | Status: DC

## 2014-07-15 NOTE — Patient Instructions (Signed)
Call with an update in 3-4 weeks. We have given you the following medications to take to your pharmacy for you to pick up at your convenience. Pantoprazole CC:  Colin Benton MD

## 2014-07-16 NOTE — Progress Notes (Signed)
     07/16/2014 Danielle Sims [id number] 1980-12-02   History of Present Illness:  This is a pleasant 34 year old female who is known to Dr. Henrene Pastor for reflux issues.  She had an EGD in 01/2013 at which time she was found to have only reflux, but EGD was otherwise normal.  She has been taking omeprazole 20 mg daily.  She comes in today with complaints of post-prandial nausea for the past couple of weeks.  She says that the nausea gets really strong immediately after eating and last 1-2 hours.  Occurs with every meal.  No vomiting or abdominal pain.  She has actually increased her omeprazole to twice a day recently but so far it has not helped.  She had a negative pregnancy test.  She is asking about Hpylori testing.    Current Medications, Allergies, Past Medical History, Past Surgical History, Family History and Social History were reviewed in [employer] record.   Physical Exam: BP 92/54 mmHg  Pulse 72  Ht 4\' 11"  (1.499 m)  Wt 118 lb 4 oz (53.638 kg)  BMI 23.87 kg/m2  LMP 07/14/2014 (Exact Date) General: Well developed female in no acute distress Head: Normocephalic and atraumatic Eyes:  Sclerae anicteric, conjunctiva pink  Ears: Normal auditory acuity Lungs: Clear throughout to auscultation Heart: Regular rate and rhythm Abdomen: Soft, non-distended.  Normal bowel sounds.  Minimal epigastric TTP without R/R/G. Musculoskeletal: Symmetrical with no gross deformities  Extremities: No edema  Neurological: Alert oriented x 4, grossly non-focal Psychological:  Alert and cooperative. Normal mood and affect  Assessment and Recommendations: -Nausea, increased GERD:  Her nausea is likely related to her reflux issues.  Symptoms worse after eating, ? Gastroparesis.  Gallbladder issue is a consideration as well.  She would like to be checked for [facility]; has never been treated in the past for this so will check Hpylori IgG.  If positive then will treat.  If negative, then will  try changing PPI and see if this helps her symptoms.  Will try pantoprazole 40 mg daily.  Could consider GES vs gallbladder evaluation if symptoms continue.  She will call in 3-4 weeks with an update on her symptoms.

## 2014-07-16 NOTE — Progress Notes (Signed)
Agree with initial assessment and plans 

## 2014-07-30 ENCOUNTER — Ambulatory Visit: Payer: Managed Care, Other (non HMO) | Admitting: Family Medicine

## 2014-07-30 ENCOUNTER — Encounter: Payer: Self-pay | Admitting: Gastroenterology

## 2014-07-30 ENCOUNTER — Ambulatory Visit (INDEPENDENT_AMBULATORY_CARE_PROVIDER_SITE_OTHER): Payer: Managed Care, Other (non HMO) | Admitting: Family Medicine

## 2014-07-30 ENCOUNTER — Encounter: Payer: Self-pay | Admitting: Family Medicine

## 2014-07-30 ENCOUNTER — Telehealth: Payer: Self-pay | Admitting: Gastroenterology

## 2014-07-30 VITALS — BP 100/64 | HR 75 | Temp 98.8°F | Ht 59.0 in | Wt 116.0 lb

## 2014-07-30 DIAGNOSIS — R070 Pain in throat: Secondary | ICD-10-CM

## 2014-07-30 MED ORDER — METHYLPREDNISOLONE 4 MG PO KIT: PACK | ORAL | Status: AC

## 2014-07-30 NOTE — Progress Notes (Signed)
   Subjective:    Patient ID: Danielle Sims, female    DOB: 06/18/1981, 34 y.o.   MRN: [id number]  HPI Here for 4 days of a sharp burning pain in the throat that she thinks is a reaction to Protonix. She had been on Omeprazole for GERD but was recently switched to Protonix. She has no fever or cough or HA. No hx of fever blisters. No recent antibiotic use.    Review of Systems  Constitutional: Negative.   HENT: Positive for sore throat. Negative for congestion, ear pain, mouth sores, postnasal drip, sinus pressure, trouble swallowing and voice change.   Respiratory: Negative.   Cardiovascular: Negative.   Gastrointestinal: Negative.        Objective:   Physical Exam  Constitutional: She appears well-developed and well-nourished.  HENT:  Right Ear: External ear normal.  Left Ear: External ear normal.  Nose: Nose normal.  Mouth/Throat: Oropharynx is clear and moist.  Eyes: Conjunctivae are normal.  Neck: No thyromegaly present.  Pulmonary/Chest: Effort normal and breath sounds normal.  Lymphadenopathy:    She has no cervical adenopathy.          Assessment & Plan:  It is not clear if this is a reaction to Protonix or not, but she is not wanting to use this any more. She will stop this and go back on Omeprazole. She may have some aphthous ulcers but it is hard to tell. Try a Medrol dose pack. If this does not improve in a few days she may need referral to ENT.

## 2014-07-30 NOTE — Progress Notes (Signed)
Pre visit review using our clinic review tool, if applicable. No additional management support is needed unless otherwise documented below in the visit note. 

## 2014-07-30 NOTE — Telephone Encounter (Signed)
Pt states she saw Alonza Bogus PA and was switched from omeprazole to protonix and that for the first couple of days it was working great. Now pt states that she is having diarrhea off and on and lots of abdominal cramping. States she also has an area on the back of her tongue that is very sore, rough and almost like blisters. States it does not appear to have a white coating. Pt has OV appt with primary care today but wants to know if these are all side effects of the protonix. Please advise.

## 2014-07-30 NOTE — Telephone Encounter (Signed)
Left message for pt to call back  °

## 2014-08-02 NOTE — Telephone Encounter (Signed)
Pt states she saw a family care md and she was told to stop the protonix because they thought she was having a reaction to the protonix. Pt states that since she has stopped it she feels so much better and all symptoms have stopped. The other physician had her start omeprazole back and she is trying this. Pt instructed to call us back if she had any further problems, she verbalized understanding.

## 2014-08-02 NOTE — Telephone Encounter (Signed)
Please let patient know that some of the PPI's can cause diarrhea so that could be from the medication.  Has she continued the medication since she sent this message last week?  Has the diarrhea changed at all?  I am not sure about the sandpaper feeling on the back of her tongue.  Not a usual side effect but I guess the only way to know is to stop the medication and see if it gets better.  May want to see her PCP as well especially if it continues.    Please get an update on her symptoms.  Does she have a follow-up appt scheduled in our office?  Thank you,  Janett Billow

## 2014-10-12 ENCOUNTER — Other Ambulatory Visit (HOSPITAL_COMMUNITY)
Admission: RE | Admit: 2014-10-12 | Discharge: 2014-10-12 | Disposition: A | Discharge status: Home / Self Care | Payer: Managed Care, Other (non HMO) | Source: Ambulatory Visit | Attending: Family Medicine | Admitting: Family Medicine

## 2014-10-12 ENCOUNTER — Encounter: Payer: Self-pay | Admitting: Family Medicine

## 2014-10-12 ENCOUNTER — Ambulatory Visit (INDEPENDENT_AMBULATORY_CARE_PROVIDER_SITE_OTHER): Payer: Managed Care, Other (non HMO) | Admitting: Family Medicine

## 2014-10-12 VITALS — BP 100/70 | HR 81 | Temp 97.4°F | Ht 59.0 in | Wt 114.0 lb

## 2014-10-12 DIAGNOSIS — R946 Abnormal results of thyroid function studies: Secondary | ICD-10-CM | POA: Diagnosis not present

## 2014-10-12 DIAGNOSIS — Z01419 Encounter for gynecological examination (general) (routine) without abnormal findings: Secondary | ICD-10-CM | POA: Diagnosis present

## 2014-10-12 DIAGNOSIS — Z1151 Encounter for screening for human papillomavirus (HPV): Secondary | ICD-10-CM | POA: Diagnosis present

## 2014-10-12 DIAGNOSIS — Z124 Encounter for screening for malignant neoplasm of cervix: Secondary | ICD-10-CM

## 2014-10-12 DIAGNOSIS — Z0000 Encounter for general adult medical examination without abnormal findings: Secondary | ICD-10-CM | POA: Diagnosis not present

## 2014-10-12 DIAGNOSIS — L989 Disorder of the skin and subcutaneous tissue, unspecified: Secondary | ICD-10-CM

## 2014-10-12 DIAGNOSIS — T7840XD Allergy, unspecified, subsequent encounter: Secondary | ICD-10-CM | POA: Diagnosis not present

## 2014-10-12 LAB — LIPID PANEL
CHOL/HDL RATIO: 4
Cholesterol: 188 mg/dL (ref 0–200)
HDL: 48.4 mg/dL (ref >39.00)
LDL Cholesterol: 111 mg/dL — ABNORMAL HIGH (ref 0–99)
NONHDL: 139.6
Triglycerides: 145 mg/dL (ref 0.0–149.0)
VLDL: 29 mg/dL (ref 0.0–40.0)

## 2014-10-12 LAB — T4, FREE: Free T4: 0.67 ng/dL (ref 0.60–1.60)

## 2014-10-12 LAB — TSH: TSH: 2.04 u[IU]/mL (ref 0.35–4.50)

## 2014-10-12 LAB — HEMOGLOBIN A1C: Hgb A1c MFr Bld: 5.8 % (ref 4.6–6.5)

## 2014-10-12 MED ORDER — MONTELUKAST SODIUM 10 MG PO TABS: ORAL_TABLET | ORAL | Status: DC

## 2014-10-12 NOTE — Progress Notes (Signed)
Pre visit review using our clinic review tool, if applicable. No additional management support is needed unless otherwise documented below in the visit note. 

## 2014-10-12 NOTE — Progress Notes (Signed)
HPI:  Here for CPE:  -Concerns and/or follow up today:   Mole on bottock: -can feel it -has noticed there for several years -no pain or pruritis or bleeding  Allergic Rhinitis: -bad the last few months - always worse this time of year -symptoms: nasal congestion, PND, sneezing -takes: singulair, benadryl - reports did not like claritin or zyrtec, she does not use her INS  -Diet: variety of foods, balance and well rounded  -Exercise: no regular exercise - active at work  -Taking folic acid, vitamin D or calcium: no  -Diabetes and Dyslipidemia Screening: FASTING  -Hx of HTN: no  -Vaccines: UTD  -pap history: had pap in 2014 but transformation zone absent  -FDLMP: March 12th - report has irr periods - husband has vasectomy so she is not concerned for pregnancy  -sexual activity: yes, female partner, no new partners  -wants STI testing: no  -FH breast, colon or ovarian ca: see FH Last mammogram: n/a Last colon cancer screening: n/a  -Alcohol, Tobacco, drug use: see social history  Review of Systems - no fevers, unintentional weight loss, vision loss, hearing loss, chest pain, sob, hemoptysis, melena, hematochezia, hematuria, genital discharge, changing or concerning skin lesions, bleeding, bruising, loc, thoughts of self harm or SI  Past Medical History  Diagnosis Date  . IBS (irritable bowel syndrome) 05/31/2011    sees Alonza Bogus in GI - hx rectal bleeding  . GERD (gastroesophageal reflux disease) 05/31/2011    and nausea, sees Alonza Bogus in [place]  . Optic neuritis 07/12/2011    sees opthomologist  . Allergic rhinitis 07/13/2011  . Migraines 07/13/2011  . Bell's palsy 07/13/2011    Hx of, used to see Dr. Jacelyn Grip  . Anxiety attack 11/13/2011  . Chest pain, atypical 11/29/2011  . Folliculitis 4/48/1856  . 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 Urticaria 01/11/2012  . Other and unspecified ovarian cysts   . 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 Gonorrhea 02/08/2013  . Abnormal thyroid  function test 03/15/2013    once, all normal since  . History of Meniere's disease 09/14/2011    Past Surgical History  Procedure Laterality Date  . Rhinoplasty      at 34 yo  . Cheek bones corrected      at 34 yo  . Breast enhancement surgery  2008  . Appendectomy  2006    Enlarged  . Upper gastrointestinal endoscopy      Family History  Problem Relation Age of Onset  . Stroke Mother   . Heart disease Mother   . Hyperlipidemia Mother   . Rheum arthritis Mother   . Fibroids Mother     Uterine  . Fibrocystic breast disease Mother   . Other Mother     gastritis  . Hyperlipidemia Maternal Grandmother   . Osteoarthritis Maternal Grandmother   . Glaucoma Maternal Grandmother   . Kidney disease Maternal Grandmother   . Cancer Maternal Grandfather     Skin, prostate, throat, stomach  . Colon cancer Maternal Grandfather   . Breast cancer Maternal Aunt   . Other Father     gastritis  . Heart disease Father     arteriosclerosis  . Arthritis Father     femoral head necrosis b/l    History   Social History  . Marital Status: Married    Spouse Name: N/A  . Number of Children: 1  . Years of Education: N/A   Occupational History  . CMA    Social History Main Topics  .  Smoking status: Never Smoker   . Smokeless tobacco: Never Used  . Alcohol Use: 0.0 oz/week    0 Standard drinks or equivalent per week     Comment: ocassional-2-3 weekly  . Drug Use: No  . Sexual Activity:    Partners: Male   Other Topics Concern  . 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  acyclovir (ZOVIRAX) 800 MG tablet, Take 1 tablet (800 mg total) by mouth 3 (three) times daily., Disp: 21 tablet, Rfl: 1 .   aspirin-acetaminophen-caffeine (EXCEDRIN MIGRAINE) 268-341-96 MG per tablet, Take 1 tablet by mouth every 6 (six) hours as needed. For migraine, Disp: , Rfl:  .  diphenhydrAMINE (BENADRYL) 25 MG tablet, Take 25 mg by mouth at bedtime as needed. For allergies, Disp: , Rfl:  .  hyoscyamine (LEVSIN SL) 0.125 MG SL tablet, 1-2 tablets sublingually every 4 hours as needed for pain, Disp: 30 tablet, Rfl: 6 .  montelukast (SINGULAIR) 10 MG tablet, TAKE 1 TABLET (10 MG TOTAL) BY MOUTH DAILY AS NEEDED., Disp: 90 tablet, Rfl: 3 .  olopatadine (PATANOL) 0.1 % ophthalmic solution, Place 1 drop into both eyes daily as needed. For allergies, Disp: , Rfl:  .  omeprazole (PRILOSEC) 20 MG capsule, TAKE 1 CAPSULE (20 MG TOTAL) BY MOUTH DAILY., Disp: 90 capsule, Rfl: 1 .  predniSONE (DELTASONE) 20 MG tablet, Take 20 mg by mouth daily as needed., Disp: , Rfl:   EXAM:  Filed Vitals:   10/12/14 0832  BP: 100/70  Pulse: 81  Temp: 97.4 F (36.3 C)    GENERAL: vitals reviewed and listed below, alert, oriented, appears well hydrated and in no acute distress  HEENT: head atraumatic, PERRLA, normal appearance of eyes, ears, nose and mouth. moist mucus membranes.  NECK: supple, no masses or lymphadenopathy  LUNGS: clear to auscultation bilaterally, no rales, rhonchi or wheeze  CV: HRRR, no peripheral edema or cyanosis, normal pedal pulses  BREAST: normal appearance - no lesions or discharge, on palpation normal breast tissue without any suspicious masses  ABDOMEN: bowel sounds normal, soft, non tender to palpation, no masses, no rebound or guarding  GU: normal appearance of external genitalia - no lesions or masses, normal vaginal mucosa - no abnormal discharge, normal appearance of cervix - no lesions or abnormal discharge, no masses or tenderness on palpation of uterus and ovaries.  RECTAL: refused  SKIN: no rash or abnormal lesions  MS: normal gait, moves all extremities normally  NEURO: CN II-XII  grossly intact, normal muscle strength and sensation to light touch on extremities  PSYCH: normal affect, pleasant and cooperative  ASSESSMENT AND PLAN:  Discussed the following assessment and plan:  Visit for preventive health examination - Plan: Hemoglobin A1c, Lipid Panel, TSH, T4, Free  Allergic state, subsequent encounter  Skin lesion  Borderline abnormal TFTs  -Discussed and advised all [place] preventive services health task force level A and B recommendations for age, sex and risks.  -Advised at least 150 minutes of exercise per week and a healthy diet low in saturated fats and sweets and consisting of fresh fruits and vegetables, lean meats such  as fish and white chicken and whole grains.  -FASTING labs, studies and vaccines per orders this encounter  Orders Placed This Encounter  Procedures  . Hemoglobin A1c  . Lipid Panel  . TSH  . T4, Free    Patient advised to return to clinic immediately if symptoms worsen or persist or new concerns.  Patient Instructions  BEFORE YOU LEAVE: -labs -follow up appointment in 6 months  Try 1/2 tablet of zyrtec nightly or allegra instead of benadryl  Use flonase every day during the allergy seasons    Return in about 6 months (around 04/13/2015) for follow up.  Colin Benton R.

## 2014-10-12 NOTE — Patient Instructions (Signed)
BEFORE YOU LEAVE: -labs -follow up appointment in 6 months  Try 1/2 tablet of zyrtec nightly or allegra instead of benadryl  Use flonase every day during the allergy seasons

## 2014-10-12 NOTE — Addendum Note (Signed)
Addended by: Agnes Lawrence on: 10/12/2014 08:58 AM   Modules accepted: Orders

## 2014-10-13 LAB — CYTOLOGY - PAP

## 2014-11-12 ENCOUNTER — Telehealth: Payer: Self-pay | Admitting: Family Medicine

## 2014-11-12 NOTE — Telephone Encounter (Signed)
Pt would like pap smear results ok to leave message

## 2014-11-12 NOTE — Telephone Encounter (Signed)
I called the pt and informed her the pap smear was normal.  She stated she could not see this in her Mychart.

## 2014-12-10 ENCOUNTER — Other Ambulatory Visit: Payer: Self-pay | Admitting: Internal Medicine

## 2014-12-14 ENCOUNTER — Ambulatory Visit (INDEPENDENT_AMBULATORY_CARE_PROVIDER_SITE_OTHER): Payer: Managed Care, Other (non HMO) | Admitting: Family Medicine

## 2014-12-14 ENCOUNTER — Encounter: Payer: Self-pay | Admitting: *Deleted

## 2014-12-14 ENCOUNTER — Encounter: Payer: Self-pay | Admitting: Family Medicine

## 2014-12-14 VITALS — BP 100/64 | HR 98 | Temp 98.3°F | Ht 59.0 in | Wt 112.5 lb

## 2014-12-14 DIAGNOSIS — J069 Acute upper respiratory infection, unspecified: Secondary | ICD-10-CM | POA: Diagnosis not present

## 2014-12-14 DIAGNOSIS — R519 Headache, unspecified: Secondary | ICD-10-CM

## 2014-12-14 DIAGNOSIS — G510 Bell's palsy: Secondary | ICD-10-CM

## 2014-12-14 DIAGNOSIS — R51 Headache: Secondary | ICD-10-CM | POA: Diagnosis not present

## 2014-12-14 NOTE — Patient Instructions (Addendum)
BEFORE YOU LEAVE: -work note for today  Please try increasing the oxcarbazepine to 300mg  twice daily - taper off of this medicine slowly with your neurologist if you ever need to stop  Call to schedule a follow up with your neurologist  INSTRUCTIONS FOR UPPER RESPIRATORY INFECTION:  -plenty of rest and fluids  -nasal saline wash 2-3 times daily (use prepackaged nasal saline or bottled/distilled water if making your own)   -can use AFRIN nasal spray for drainage and nasal congestion - but do NOT use longer then 3-4 days  -can use tylenol (in no history of liver disease) or ibuprofen (if no history of kidney disease, bowel bleeding or significant heart disease) as directed for aches and sorethroat  -in the winter time, using a humidifier at night is helpful (please follow cleaning instructions)  -if you are taking a cough medication - use only as directed, may also try a teaspoon of honey to coat the throat and throat lozenges. If given a cough medication with codeine or hydrocodone or other narcotic please be advised that this contains a strong and  potentially addicting medication. Please follow instructions carefully, take as little as possible and only use AS NEEDED for severe cough. Discuss potential side effects with your pharmacy. Please do not drive or operate machinery while taking these types of medications. Please do not take other sedating medications, drugs or alcohol while taking this medication without discussing with your doctor.  -for sore throat, salt water gargles can help  -follow up if you have fevers, facial pain, tooth pain, difficulty breathing or are worsening or symptoms persist longer then expected  Upper Respiratory Infection, Adult An upper respiratory infection (URI) is also known as the common cold. It is often caused by a type of germ (virus). Colds are easily spread (contagious). You can pass it to others by kissing, coughing, sneezing, or drinking out of the  same glass. Usually, you get better in 1 to 3  weeks.  However, the cough can last for even longer. HOME CARE   Only take medicine as told by your doctor. Follow instructions provided above.  Drink enough water and fluids to keep your pee (urine) clear or pale yellow.  Get plenty of rest.  Return to work when your temperature is < 100 for 24 hours or as told by your doctor. You may use a face mask and wash your hands to stop your cold from spreading. GET HELP RIGHT AWAY IF:   After the first few days, you feel you are getting worse.  You have questions about your medicine.  You have chills, shortness of breath, or red spit (mucus).  You have pain in the face for more then 1-2 days, especially when you bend forward.  You have a fever, puffy (swollen) neck, pain when you swallow, or white spots in the back of your throat.  You have a bad headache, ear pain, sinus pain, or chest pain.  You have a high-pitched whistling sound when you breathe in and out (wheezing).  You cough up blood.  You have sore muscles or a stiff neck. MAKE SURE YOU:   Understand these instructions.  Will watch your condition.  Will get help right away if you are not doing well or get worse. Document Released: 12/05/2007 Document Revised: 09/10/2011 Document Reviewed: 09/23/2013 [facility] Patient Information 2015 [place], [place]. This information is not intended to replace advice given to you by your health care provider. Make sure you discuss any questions you  have with your health care provider.  

## 2014-12-14 NOTE — Progress Notes (Signed)
Pre visit review using our clinic review tool, if applicable. No additional management support is needed unless otherwise documented below in the visit note. 

## 2014-12-14 NOTE — Progress Notes (Signed)
HPI:  Sore throat: -started: yesterday -symptoms:nasal congestion, sore throat, cough, PND, laryngitis worsening of her trigeminal neuralgia since yesterday -denies:fever, SOB, NVD -sick contacts/travel/risks: denies flu exposure, tick exposure or or Ebola risks -son had cold recently for the last week -Hx of: allergies  Trigeminal neuralgia: -she reports has a hx of bells palsy and facial pain/trigeminal neuralgia that she used to see a neurologist for and takes oxcarbazepine 150mg  bid as needed which she has been taking as needed the last few days -reports this has worsened since yesterday with sharp stabbing pains in L face -reports several flares per year and wants to see neurology again, but reports Dr. Jacelyn Grip no longer in practice -denies: weakness, fevers, speech or vision changes  ROS: See pertinent positives and negatives per HPI.  Past Medical History  Diagnosis Date  . IBS (irritable bowel syndrome) 05/31/2011    sees Alonza Bogus in GI - hx rectal bleeding  . GERD (gastroesophageal reflux disease) 05/31/2011    and nausea, sees Alonza Bogus in [place]  . Optic neuritis 07/12/2011    sees opthomologist  . Allergic rhinitis 07/13/2011  . Migraines 07/13/2011  . Bell's palsy 07/13/2011    Hx of, used to see Dr. Jacelyn Grip  . Anxiety attack 11/13/2011  . Chest pain, atypical 11/29/2011  . Folliculitis 1/61/0960  . 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 Urticaria 01/11/2012  . Other and unspecified ovarian cysts   . 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 Gonorrhea 02/08/2013  . Abnormal thyroid function test 03/15/2013    once, all normal since  . History of Meniere's disease 09/14/2011    Past Surgical History  Procedure Laterality Date  . Rhinoplasty      at 34 yo  . Cheek bones corrected      at 34 yo  . Breast enhancement surgery  2008  . Appendectomy  2006    Enlarged  . Upper gastrointestinal endoscopy      Family History  Problem Relation Age of Onset  . Stroke Mother   . Heart  disease Mother   . Hyperlipidemia Mother   . Rheum arthritis Mother   . Fibroids Mother     Uterine  . Fibrocystic breast disease Mother   . Other Mother     gastritis  . Hyperlipidemia Maternal Grandmother   . Osteoarthritis Maternal Grandmother   . Glaucoma Maternal Grandmother   . Kidney disease Maternal Grandmother   . Cancer Maternal Grandfather     Skin, prostate, throat, stomach  . Colon cancer Maternal Grandfather   . Breast cancer Maternal Aunt   . Other Father     gastritis  . Heart disease Father     arteriosclerosis  . Arthritis Father     femoral head necrosis b/l    History   Social History  . Marital Status: Married    Spouse Name: N/A  . Number of Children: 1  . Years of Education: N/A   Occupational History  . CMA    Social History Main Topics  . Smoking status: Never Smoker   . Smokeless tobacco: Never Used  . Alcohol Use: 0.0 oz/week    0 Standard drinks or equivalent per week     Comment: ocassional-2-3 weekly  . Drug Use: No  . Sexual Activity:    Partners: Male   Other Topics Concern  . 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  acyclovir (ZOVIRAX) 800 MG tablet, Take 1 tablet (800 mg total) by mouth 3 (three) times daily., Disp: 21 tablet, Rfl: 1 .  aspirin-acetaminophen-caffeine (EXCEDRIN MIGRAINE) 323-557-32 MG per tablet, Take 1 tablet by mouth every 6 (six) hours as needed. For migraine, Disp: , Rfl:  .  diphenhydrAMINE (BENADRYL) 25 MG tablet, Take 25 mg by mouth at bedtime as needed. For allergies, Disp: , Rfl:  .  montelukast (SINGULAIR) 10 MG tablet, TAKE 1 TABLET (10 MG TOTAL) BY MOUTH DAILY AS NEEDED., Disp: 90 tablet, Rfl: 3 .  olopatadine (PATANOL)  0.1 % ophthalmic solution, Place 1 drop into both eyes daily as needed. For allergies, Disp: , Rfl:  .  omeprazole (PRILOSEC) 20 MG capsule, TAKE 1 CAPSULE (20 MG TOTAL) BY MOUTH DAILY., Disp: 90 capsule, Rfl: 1 .  predniSONE (DELTASONE) 20 MG tablet, Take 20 mg by mouth daily as needed., Disp: , Rfl:  .  hyoscyamine (LEVSIN SL) 0.125 MG SL tablet, PLACE 1-2 TABLETS UNDER TONGUE EVERY 4 HOURS DAILY AS NEEDED FOR PAIN, Disp: 30 tablet, Rfl: 0  EXAM:  Filed Vitals:   12/14/14 0922  BP: 100/64  Pulse: 98  Temp: 98.3 F (36.8 C)    Body mass index is 22.71 kg/(m^2).  GENERAL: vitals reviewed and listed above, alert, oriented, appears well hydrated and in no acute distress  HEENT: atraumatic, conjunttiva clear, no obvious abnormalities on inspection of external nose and ears, normal appearance of ear canals and TMs, clear nasal congestion, mild post oropharyngeal erythema with PND, no tonsillar edema or exudate, no sinus TTP  NECK: no obvious masses on inspection  LUNGS: clear to auscultation bilaterally, no wheezes, rales or rhonchi, good air movement  CV: HRRR, no peripheral edema  MS: moves all extremities without noticeable abnormality  PSYCH/NEURO: pleasant and cooperative, no obvious depression or anxiety, speech normal other then hoarsness, thought processing grossly normal, CN II-XII grossly intact, finger to nose normal  ASSESSMENT AND PLAN:  Discussed the following assessment and plan:  Acute upper respiratory infection -given HPI and exam findings today, a serious infection or illness is unlikely. We discussed potential etiologies, with VURI being most likely, and advised supportive care and monitoring. We discussed treatment side effects, likely course, antibiotic misuse, transmission, and signs of developing a serious illness.  Facial pain - Plan: Ambulatory referral to Neurology Facial paralysis/Bells palsy -increase oxcarbazapine to 300bid, risks discussed and taper  advised if ever stopping  -f/u with neurology, referral placed per request  They may wish to re-establish with prior PCP. Still trying to decide. I advised this is ok with me.  -of course, we advised to return or notify a doctor immediately if symptoms worsen or persist or new concerns arise.    Patient Instructions  BEFORE YOU LEAVE: -work note for today  Please try increasing the oxcarbazepine to 300mg  twice daily - taper off of this medicine slowly with your neurologist if you ever need to stop  Call to schedule a follow up with your neurologist  INSTRUCTIONS FOR UPPER RESPIRATORY INFECTION:  -plenty of rest and fluids  -nasal saline wash 2-3 times daily (use prepackaged nasal saline or bottled/distilled water if making your own)   -can use AFRIN  nasal spray for drainage and nasal congestion - but do NOT use longer then 3-4 days  -can use tylenol (in no history of liver disease) or ibuprofen (if no history of kidney disease, bowel bleeding or significant heart disease) as directed for aches and sorethroat  -in the winter time, using a humidifier at night is helpful (please follow cleaning instructions)  -if you are taking a cough medication - use only as directed, may also try a teaspoon of honey to coat the throat and throat lozenges. If given a cough medication with codeine or hydrocodone or other narcotic please be advised that this contains a strong and  potentially addicting medication. Please follow instructions carefully, take as little as possible and only use AS NEEDED for severe cough. Discuss potential side effects with your pharmacy. Please do not drive or operate machinery while taking these types of medications. Please do not take other sedating medications, drugs or alcohol while taking this medication without discussing with your doctor.  -for sore throat, salt water gargles can help  -follow up if you have fevers, facial pain, tooth pain, difficulty breathing or  are worsening or symptoms persist longer then expected  Upper Respiratory Infection, Adult An upper respiratory infection (URI) is also known as the common cold. It is often caused by a type of germ (virus). Colds are easily spread (contagious). You can pass it to others by kissing, coughing, sneezing, or drinking out of the same glass. Usually, you get better in 1 to 3  weeks.  However, the cough can last for even longer. HOME CARE   Only take medicine as told by your doctor. Follow instructions provided above.  Drink enough water and fluids to keep your pee (urine) clear or pale yellow.  Get plenty of rest.  Return to work when your temperature is < 100 for 24 hours or as told by your doctor. You may use a face mask and wash your hands to stop your cold from spreading. GET HELP RIGHT AWAY IF:   After the first few days, you feel you are getting worse.  You have questions about your medicine.  You have chills, shortness of breath, or red spit (mucus).  You have pain in the face for more then 1-2 days, especially when you bend forward.  You have a fever, puffy (swollen) neck, pain when you swallow, or white spots in the back of your throat.  You have a bad headache, ear pain, sinus pain, or chest pain.  You have a high-pitched whistling sound when you breathe in and out (wheezing).  You cough up blood.  You have sore muscles or a stiff neck. MAKE SURE YOU:   Understand these instructions.  Will watch your condition.  Will get help right away if you are not doing well or get worse. Document Released: 12/05/2007 Document Revised: 09/10/2011 Document Reviewed: 09/23/2013 [facility] Patient Information 2015 [place], [place]. This information is not intended to replace advice given to you by your health care provider. Make sure you discuss any questions you have with your health care provider.      Colin Benton R.

## 2014-12-22 ENCOUNTER — Encounter: Payer: Self-pay | Admitting: Family Medicine

## 2014-12-23 ENCOUNTER — Telehealth: Payer: Self-pay | Admitting: Family Medicine

## 2014-12-23 NOTE — Telephone Encounter (Signed)
I already agreed to accept her back as a patient, please confirm she is aware and set her up with an appt.     ----- Message -----     From: Wilfrid Lund, CMA     Sent: 12/22/2014 12:23 PM      To: Mosie Lukes, MD    Subject: Melton Alar: Non-Urgent Medical Question                       ----- Message -----     From: Evelina Dun     Sent: 12/22/2014 12:21 PM      To: [facility] Clinical Pool    Subject: Non-Urgent Medical Question                 Dr. Charlett Blake,        I am officially requesting to be transfer back to you as patient.    You know my medical history better than anyone and I find it easier and more convenient for me to continue to see you.    Left message for patient to return my call to schedule new patient appointment.

## 2015-01-24 ENCOUNTER — Ambulatory Visit (INDEPENDENT_AMBULATORY_CARE_PROVIDER_SITE_OTHER): Payer: Managed Care, Other (non HMO) | Admitting: Neurology

## 2015-01-24 ENCOUNTER — Encounter: Payer: Self-pay | Admitting: Neurology

## 2015-01-24 VITALS — BP 100/70 | HR 60 | Resp 12 | Ht 59.0 in | Wt 114.4 lb

## 2015-01-24 DIAGNOSIS — G500 Trigeminal neuralgia: Secondary | ICD-10-CM | POA: Diagnosis not present

## 2015-01-24 DIAGNOSIS — G501 Atypical facial pain: Secondary | ICD-10-CM

## 2015-01-24 DIAGNOSIS — G529 Cranial nerve disorder, unspecified: Secondary | ICD-10-CM

## 2015-01-24 DIAGNOSIS — G527 Disorders of multiple cranial nerves: Secondary | ICD-10-CM

## 2015-01-24 MED ORDER — OXCARBAZEPINE ER 300 MG PO TB24: 300.0000 mg | ORAL_TABLET | Freq: Every day | ORAL | Status: DC

## 2015-01-24 NOTE — Patient Instructions (Addendum)
1. Switch to extended release formulation of oxcarbamazepine 300mg  daily 2. If you do not tolerate this medication, please call my office and we can try alternative medication 3. If symptoms worsen, please call so we can schedule MRI brain 4. Return to clinic as needed

## 2015-01-24 NOTE — Progress Notes (Signed)
[facility] Clinic Note - Initial Visit   Date: 01/24/2015  Danielle Sims MRN: [id number] DOB: [birth year]   Dear Dr. Charlett Blake:  Thank you for your kind referral of Danielle Sims for consultation of left facial pain. Although her history is well known to you, please allow [place] to reiterate it for the purpose of our medical record. The patient was accompanied to the clinic by self.    History of Present Illness: Danielle Sims is a 34 y.o. 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    Patient was previously evaluated by Dr. Neena Rhymes here at [facility] neurology in 2013 for left facial weakness, optic neuritis (superior nasal field visual), and facial pain.  She reports having three episodes of left facial weakness with lip droopiness and numbness/tingling of the left face and underwent MRI brain and CSF testing which returned normal.  There was a clinical suspicion for sarcoid, and referred to rheumatology but work-up including CT chest was negative. She had one episode in 2014 and took prednisone and acyclovir which resolved within three weeks.  She was doing well during the year of 2015 and starting in early 2016, she started having worsening left facial pain. Pain is sharp and starts near the ear and moves towards her eye and temporal region.  Pain usually last 3-4 hours or upto several days.  There is no new facial weakness or vision changes.  Her oxcarbamazepine was increased to 150mg  twice daily which does help her pain, but makes her very sleepy.     Out-side paper records, electronic medical record, and images have been reviewed where available and summarized as:  MRI brain wwo contrast 02/08/2012: 1. Stable and normal MRI appearance the brain. 2. Normal pre and postcontrast cranial nerve imaging. Normal IAC imaging.  Past  Medical History  Diagnosis Date  . IBS (irritable bowel syndrome) 05/31/2011    sees Alonza Bogus in GI - hx rectal bleeding  . GERD (gastroesophageal reflux disease) 05/31/2011    and nausea, sees Alonza Bogus in [place]  . Optic neuritis 07/12/2011    sees opthomologist  . Allergic rhinitis 07/13/2011  . Migraines 07/13/2011  . Bell's palsy 07/13/2011    Hx of, used to see Dr. Jacelyn Grip  . Anxiety attack 11/13/2011  . Chest pain, atypical 11/29/2011  . Folliculitis 1/93/7902  . 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 Urticaria 01/11/2012  . Other and unspecified ovarian cysts   . 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 Gonorrhea 02/08/2013  . Abnormal thyroid function test 03/15/2013    once, all normal since  . History of Meniere's disease 09/14/2011    Past Surgical History  Procedure Laterality Date  . Rhinoplasty      at 35 yo  . Cheek bones corrected      at 34 yo  . Breast enhancement surgery  2008  . Appendectomy  2006    Enlarged  . Upper gastrointestinal endoscopy       Medications:  Outpatient Encounter Prescriptions as of 01/24/2015  Medication Sig Note  . acyclovir (ZOVIRAX) 800 MG tablet Take 1 tablet (800 mg total) by mouth 3 (three) times daily.   Marland Kitchen aspirin-acetaminophen-caffeine (EXCEDRIN MIGRAINE) 250-250-65 MG per tablet Take 1 tablet by mouth every 6 (six) hours as needed. For migraine   . diphenhydrAMINE (BENADRYL) 25 MG tablet Take 25 mg by mouth at bedtime as needed. For allergies   .  hyoscyamine (LEVSIN SL) 0.125 MG SL tablet PLACE 1-2 TABLETS UNDER TONGUE EVERY 4 HOURS DAILY AS NEEDED FOR PAIN   . montelukast (SINGULAIR) 10 MG tablet TAKE 1 TABLET (10 MG TOTAL) BY MOUTH DAILY AS NEEDED.   Marland Kitchen olopatadine (PATANOL) 0.1 % ophthalmic solution Place 1 drop into both eyes daily as needed. For allergies   . omeprazole (PRILOSEC) 20 MG capsule TAKE 1 CAPSULE (20 MG TOTAL) BY MOUTH DAILY.   Marland Kitchen OXcarbazepine (TRILEPTAL) 150 MG tablet  01/24/2015: Received from: External Pharmacy  . predniSONE  (DELTASONE) 20 MG tablet Take 20 mg by mouth daily as needed.    No facility-administered encounter medications on file as of 01/24/2015.     Allergies:  Allergies  Allergen Reactions  . Iodinated Diagnostic Agents Hives    Pt developed 2 hives around site of injection Lt antecubital that were itchy after leaving lobby post scan. Given 50 mg oral benadryl.     Family History: Family History  Problem Relation Age of Onset  . Stroke Mother   . Heart disease Mother   . Hyperlipidemia Mother   . Rheum arthritis Mother   . Fibroids Mother     Uterine  . Fibrocystic breast disease Mother   . Other Mother     gastritis  . Hyperlipidemia Maternal Grandmother   . Osteoarthritis Maternal Grandmother   . Glaucoma Maternal Grandmother   . Kidney disease Maternal Grandmother   . Cancer Maternal Grandfather     Skin, prostate, throat, stomach  . Colon cancer Maternal Grandfather   . Breast cancer Maternal Aunt   . Other Father     gastritis  . Heart disease Father     arteriosclerosis  . Arthritis Father     femoral head necrosis b/l    Social History: History  Substance Use Topics  . Smoking status: Never Smoker   . Smokeless tobacco: Never Used  . 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   EYES: No visual changes or eye pain ENT: No hearing changes.  No history of nose bleeds.   RESPIRATORY: No cough, wheezing and shortness of breath.   CARDIOVASCULAR: Negative for chest pain, and palpitations.   GI: Negative for abdominal discomfort,  blood in stools or black stools.  No recent change in bowel habits.   GU:  No history of incontinence.   MUSCLOSKELETAL: No history of joint pain or swelling.  No myalgias.   SKIN: Negative for lesions, rash, and itching.   HEMATOLOGY/ONCOLOGY: Negative for prolonged bleeding, bruising easily, and swollen nodes.  No history of cancer.   ENDOCRINE: Negative for cold or heat intolerance, polydipsia or goiter.   PSYCH:  No depression or anxiety symptoms.   NEURO: As Above.   Vital Signs:  BP 100/70 mmHg  Pulse 60  Resp 12  Ht 4\' 11"  (1.499 m)  Wt 114 lb 7 oz (51.909 kg)  BMI 23.10 kg/m2   General  Medical Exam:   General:  Well appearing, comfortable.   Eyes/ENT: see cranial nerve examination.   Neck: No masses appreciated.  Full range of motion without tenderness.  No carotid bruits. Respiratory:  Clear to auscultation, good air entry bilaterally.   Cardiac:  Regular rate and rhythm, no murmur.   Extremities:  No deformities, edema, or skin discoloration.  Skin:  No rashes or lesions.  Neurological Exam: MENTAL STATUS including orientation to time, place, person, recent and remote memory, attention span and concentration, language, and fund of knowledge is normal.  Speech is not dysarthric.  CRANIAL NERVES: II:  No visual field defects.  Unremarkable fundi.   III-IV-VI: Pupils equal round and reactive to light.  Normal conjugate, extra-ocular eye movements in all directions of gaze.  No nystagmus.  No ptosis.   V:  Reduced sensation to pin prick, temperature, and light touch over V1-V3 on the left. Jaw jerk is absent.   VII:  Normal facial symmetry and movements.  No pathologic facial reflexes.  VIII:  Normal hearing and vestibular function.   IX-X:  Normal palatal movement.   XI:  Normal shoulder shrug and head rotation.   XII:  Normal tongue strength and range of motion, no deviation or fasciculation.  MOTOR:  No atrophy, fasciculations or abnormal movements.  No pronator  drift.  Tone is normal.    Right Upper Extremity:    Left Upper Extremity:    Deltoid  5/5   Deltoid  5/5   Biceps  5/5   Biceps  5/5   Triceps  5/5   Triceps  5/5   Wrist extensors  5/5   Wrist extensors  5/5   Wrist flexors  5/5   Wrist flexors  5/5   Finger extensors  5/5   Finger extensors  5/5   Finger flexors  5/5   Finger flexors  5/5   Dorsal interossei  5/5   Dorsal interossei  5/5   Abductor pollicis  5/5   Abductor pollicis  5/5   Tone (Ashworth scale)  0  Tone (Ashworth scale)  0   Right Lower Extremity:    Left Lower Extremity:    Hip flexors  5/5   Hip flexors  5/5   Hip extensors  5/5   Hip extensors  5/5   Knee flexors  5/5   Knee flexors  5/5   Knee extensors  5/5   Knee extensors  5/5   Dorsiflexors  5/5   Dorsiflexors  5/5   Plantarflexors  5/5   Plantarflexors  5/5   Toe extensors  5/5   Toe extensors  5/5   Toe flexors  5/5   Toe flexors  5/5   Tone (Ashworth scale)  0  Tone (Ashworth scale)  0   MSRs:  Right                                                                 Left brachioradialis 3+  brachioradialis 3+  biceps 3+  biceps 3+  triceps 3+  triceps 3+  patellar 3+  patellar 3+  ankle jerk 2+  ankle jerk 2+  Hoffman no  Hoffman no  plantar response down  plantar response down   SENSORY:  Normal and symmetric  perception of light touch, pinprick, vibration, and proprioception.  Romberg's sign absent.   COORDINATION/GAIT: Normal finger-to- nose-finger and heel-to-shin.  Intact rapid alternating movements bilaterally.  Able to rise from a chair without using arms.  Gait narrow based and stable. Tandem and stressed gait intact.    IMPRESSION: 1.  Left trigeminal neuralgia  - She reports having symptomatic benefit with oxcarbamazepine 150mg  BID, but is very sleepy on it  - Switch to extended release oxcarbamazepine 300mg  daily  - We discussed obtaining repeat MRI brain given worsening pain, but patient would like to hold off on additional  testing  2.  History of optic neuritis and multiple cranial neuropathies (2013) of unclear etiology  - Extensive work-up under the care of Dr. Jacelyn Grip including CRP, ANA, Lyme, ANCA, Sjogren's, ACE, HIV and RNP ab, MRI brain wwo contrast, CT chest, and CSF testing (0 WBCs, normal protein, gram, fungal and afb stain negative, VDRL -, ACE neg)  - Autoimmune work-up with rheumatology returned negative, though there was suspicion of sarcoid  - She was briefly on prednisone 40mg  daily and now treats spells of facial weakness with prednisone and acyclovir  Return to clinic as needed   The duration of this appointment visit was 45 minutes of face-to-face time with the patient.  Greater than 50% of this time was spent in counseling, explanation of diagnosis, planning of further management, and coordination of care.   Thank you for allowing me to participate in patient's care.  If I can answer any additional questions, I would be pleased to do so.    Sincerely,    Donika K. Posey Pronto, DO

## 2015-02-16 ENCOUNTER — Other Ambulatory Visit: Payer: Self-pay | Admitting: *Deleted

## 2015-02-16 MED ORDER — OXCARBAZEPINE ER 300 MG PO TB24: 300.0000 mg | ORAL_TABLET | Freq: Every day | ORAL | Status: AC

## 2015-03-21 ENCOUNTER — Telehealth: Payer: Self-pay | Admitting: Neurology

## 2015-03-21 ENCOUNTER — Other Ambulatory Visit: Payer: Self-pay | Admitting: *Deleted

## 2015-03-21 DIAGNOSIS — G501 Atypical facial pain: Secondary | ICD-10-CM

## 2015-03-21 DIAGNOSIS — G529 Cranial nerve disorder, unspecified: Secondary | ICD-10-CM

## 2015-03-21 DIAGNOSIS — G527 Disorders of multiple cranial nerves: Secondary | ICD-10-CM

## 2015-03-21 DIAGNOSIS — G500 Trigeminal neuralgia: Secondary | ICD-10-CM

## 2015-03-21 NOTE — Telephone Encounter (Signed)
MRI brain w/wo contrast 

## 2015-03-21 NOTE — Telephone Encounter (Signed)
Last note says MRI brain.  Is this with/without?

## 2015-03-21 NOTE — Telephone Encounter (Signed)
Patient has had MRI brain.  Is this for orbits--trigeminal ?

## 2015-03-21 NOTE — Telephone Encounter (Signed)
Pt/husband/Danielle Sims called to get a MRI/ scheduled/ called/ [phone]

## 2015-03-21 NOTE — Telephone Encounter (Signed)
Left trigeminal neuralgia - please have radiology do specific sequence for this. Thanks.

## 2015-03-22 ENCOUNTER — Other Ambulatory Visit: Payer: Self-pay | Admitting: *Deleted

## 2015-03-22 DIAGNOSIS — G529 Cranial nerve disorder, unspecified: Secondary | ICD-10-CM

## 2015-03-22 DIAGNOSIS — G501 Atypical facial pain: Secondary | ICD-10-CM

## 2015-03-22 DIAGNOSIS — G527 Disorders of multiple cranial nerves: Secondary | ICD-10-CM

## 2015-03-22 DIAGNOSIS — G500 Trigeminal neuralgia: Secondary | ICD-10-CM

## 2015-03-22 NOTE — Telephone Encounter (Signed)
Order placed for MRI orbits.  Left message for [place] to call [facility] and set up an appointment.

## 2015-04-04 ENCOUNTER — Ambulatory Visit
Admission: RE | Admit: 2015-04-04 | Discharge: 2015-04-04 | Disposition: A | Discharge status: Home / Self Care | Payer: Managed Care, Other (non HMO) | Source: Ambulatory Visit | Attending: Neurology | Admitting: Neurology

## 2015-04-04 DIAGNOSIS — G527 Disorders of multiple cranial nerves: Secondary | ICD-10-CM

## 2015-04-04 DIAGNOSIS — G529 Cranial nerve disorder, unspecified: Secondary | ICD-10-CM

## 2015-04-04 DIAGNOSIS — G501 Atypical facial pain: Secondary | ICD-10-CM

## 2015-04-04 DIAGNOSIS — G500 Trigeminal neuralgia: Secondary | ICD-10-CM

## 2015-04-04 MED ORDER — GADOBENATE DIMEGLUMINE 529 MG/ML IV SOLN
10.0000 mL | Freq: Once | INTRAVENOUS | Status: AC | PRN
  Administered 2015-04-04: 10 mL via INTRAVENOUS

## 2015-04-25 ENCOUNTER — Ambulatory Visit: Payer: Managed Care, Other (non HMO) | Admitting: Family Medicine

## 2015-06-05 ENCOUNTER — Emergency Department (HOSPITAL_COMMUNITY): Payer: Managed Care, Other (non HMO)

## 2015-06-05 ENCOUNTER — Encounter (HOSPITAL_COMMUNITY): Payer: Self-pay | Admitting: *Deleted

## 2015-06-05 ENCOUNTER — Encounter (HOSPITAL_COMMUNITY): Payer: Self-pay | Admitting: Emergency Medicine

## 2015-06-05 ENCOUNTER — Emergency Department (HOSPITAL_COMMUNITY)
Admission: EM | Admit: 2015-06-05 | Discharge: 2015-06-06 | Disposition: A | Discharge status: Home / Self Care | Payer: Managed Care, Other (non HMO) | Source: Home / Self Care | Attending: Emergency Medicine | Admitting: Emergency Medicine

## 2015-06-05 ENCOUNTER — Emergency Department (HOSPITAL_COMMUNITY)
Admission: EM | Admit: 2015-06-05 | Discharge: 2015-06-05 | Disposition: A | Discharge status: Home / Self Care | Payer: Managed Care, Other (non HMO) | Attending: Emergency Medicine | Admitting: Emergency Medicine

## 2015-06-05 DIAGNOSIS — K529 Noninfective gastroenteritis and colitis, unspecified: Secondary | ICD-10-CM

## 2015-06-05 DIAGNOSIS — Z7982 Long term (current) use of aspirin: Secondary | ICD-10-CM | POA: Diagnosis not present

## 2015-06-05 DIAGNOSIS — Z3202 Encounter for pregnancy test, result negative: Secondary | ICD-10-CM | POA: Diagnosis not present

## 2015-06-05 DIAGNOSIS — Z8619 Personal history of other infectious and parasitic diseases: Secondary | ICD-10-CM | POA: Insufficient documentation

## 2015-06-05 DIAGNOSIS — R109 Unspecified abdominal pain: Secondary | ICD-10-CM | POA: Insufficient documentation

## 2015-06-05 DIAGNOSIS — Z872 Personal history of diseases of the skin and subcutaneous tissue: Secondary | ICD-10-CM | POA: Diagnosis not present

## 2015-06-05 DIAGNOSIS — Z79899 Other long term (current) drug therapy: Secondary | ICD-10-CM | POA: Diagnosis not present

## 2015-06-05 DIAGNOSIS — K219 Gastro-esophageal reflux disease without esophagitis: Secondary | ICD-10-CM | POA: Diagnosis not present

## 2015-06-05 DIAGNOSIS — F419 Anxiety disorder, unspecified: Secondary | ICD-10-CM | POA: Diagnosis not present

## 2015-06-05 DIAGNOSIS — R1013 Epigastric pain: Secondary | ICD-10-CM

## 2015-06-05 LAB — COMPREHENSIVE METABOLIC PANEL
ALBUMIN: 4.2 g/dL (ref 3.5–5.0)
ALT: 44 U/L (ref 14–54)
ANION GAP: 12 (ref 5–15)
AST: 29 U/L (ref 15–41)
Alkaline Phosphatase: 81 U/L (ref 38–126)
BILIRUBIN TOTAL: 0.7 mg/dL (ref 0.3–1.2)
BUN: 9 mg/dL (ref 6–20)
CO2: 24 mmol/L (ref 22–32)
Calcium: 9.7 mg/dL (ref 8.9–10.3)
Chloride: 102 mmol/L (ref 101–111)
Creatinine, Ser: 0.74 mg/dL (ref 0.44–1.00)
GFR calc Af Amer: >60 mL/min (ref >60)
GLUCOSE: 94 mg/dL (ref 65–99)
POTASSIUM: 3.4 mmol/L — AB (ref 3.5–5.1)
Sodium: 138 mmol/L (ref 135–145)
TOTAL PROTEIN: 8.7 g/dL — AB (ref 6.5–8.1)

## 2015-06-05 LAB — CBC
HEMATOCRIT: 44.3 % (ref 36.0–46.0)
HEMOGLOBIN: 14.6 g/dL (ref 12.0–15.0)
MCH: 30.1 pg (ref 26.0–34.0)
MCHC: 33 g/dL (ref 30.0–36.0)
MCV: 91.3 fL (ref 78.0–100.0)
Platelets: 347 10*3/uL (ref 150–400)
RBC: 4.85 MIL/uL (ref 3.87–5.11)
RDW: 13.1 % (ref 11.5–15.5)
WBC: 10.2 10*3/uL (ref 4.0–10.5)

## 2015-06-05 LAB — URINALYSIS, ROUTINE W REFLEX MICROSCOPIC
Bilirubin Urine: NEGATIVE
Glucose, UA: NEGATIVE mg/dL
Hgb urine dipstick: NEGATIVE
Ketones, ur: 15 mg/dL — AB
LEUKOCYTES UA: NEGATIVE
NITRITE: NEGATIVE
PROTEIN: NEGATIVE mg/dL
SPECIFIC GRAVITY, URINE: 1.019 (ref 1.005–1.030)
pH: 6 (ref 5.0–8.0)

## 2015-06-05 LAB — I-STAT BETA HCG BLOOD, ED (MC, WL, AP ONLY)

## 2015-06-05 LAB — LIPASE, BLOOD: Lipase: 26 U/L (ref 11–51)

## 2015-06-05 MED ORDER — IOHEXOL 300 MG/ML  SOLN
80.0000 mL | Freq: Once | INTRAMUSCULAR | Status: AC | PRN
  Administered 2015-06-05: 100 mL via INTRAVENOUS

## 2015-06-05 MED ORDER — HYDROCODONE-ACETAMINOPHEN 5-325 MG PO TABS: 2.0000 | ORAL_TABLET | ORAL | Status: DC | PRN

## 2015-06-05 MED ORDER — IOHEXOL 300 MG/ML  SOLN
25.0000 mL | INTRAMUSCULAR | Status: DC
  Administered 2015-06-05: 25 mL via ORAL

## 2015-06-05 MED ORDER — METOCLOPRAMIDE HCL 5 MG/ML IJ SOLN
10.0000 mg | Freq: Once | INTRAMUSCULAR | Status: AC
  Administered 2015-06-05: 10 mg via INTRAVENOUS
  Filled 2015-06-05: qty 2

## 2015-06-05 MED ORDER — METHYLPREDNISOLONE SODIUM SUCC 125 MG IJ SOLR
125.0000 mg | Freq: Once | INTRAMUSCULAR | Status: AC
  Administered 2015-06-05: 125 mg via INTRAVENOUS
  Filled 2015-06-05: qty 2

## 2015-06-05 MED ORDER — DIPHENHYDRAMINE HCL 50 MG/ML IJ SOLN
50.0000 mg | Freq: Once | INTRAMUSCULAR | Status: AC
  Administered 2015-06-05: 50 mg via INTRAVENOUS
  Filled 2015-06-05: qty 1

## 2015-06-05 MED ORDER — HYDROMORPHONE HCL 1 MG/ML IJ SOLN
1.0000 mg | Freq: Once | INTRAMUSCULAR | Status: AC
  Administered 2015-06-05: 1 mg via INTRAVENOUS
  Filled 2015-06-05: qty 1

## 2015-06-05 MED ORDER — FAMOTIDINE 20 MG PO TABS
20.0000 mg | ORAL_TABLET | Freq: Once | ORAL | Status: AC
  Administered 2015-06-05: 20 mg via ORAL
  Filled 2015-06-05: qty 1

## 2015-06-05 MED ORDER — ONDANSETRON 4 MG PO TBDP: ORAL_TABLET | ORAL | Status: DC

## 2015-06-05 MED ORDER — IOHEXOL 300 MG/ML  SOLN: 50.0000 mL | INTRAMUSCULAR | Status: AC

## 2015-06-05 MED ORDER — ONDANSETRON HCL 4 MG/2ML IJ SOLN
4.0000 mg | Freq: Once | INTRAMUSCULAR | Status: AC
  Administered 2015-06-05: 4 mg via INTRAVENOUS
  Filled 2015-06-05: qty 2

## 2015-06-05 MED ORDER — GI COCKTAIL ~~LOC~~
30.0000 mL | Freq: Once | ORAL | Status: AC
  Administered 2015-06-05: 30 mL via ORAL
  Filled 2015-06-05: qty 30

## 2015-06-05 NOTE — ED Provider Notes (Signed)
CSN: [id number]     Arrival date & time 06/05/15  1523 History   First MD Initiated Contact with Patient 06/05/15 1603     Chief Complaint  Patient presents with  . Abdominal Pain     (Consider location/radiation/quality/duration/timing/severity/associated sxs/prior Treatment) HPI Danielle Sims is a 34 y.o. female with history of IBS, GERD, comes in for evaluation of abdominal discomfort. Patient states she has chronic gastritis and will intermittently have flares. She reports recently seen her PCP for sinusitis and was treated with IM Rocephin and started on Zithromax. She reports taking the first 2 pills today and immediately thereafter spares abdominal discomfort with nausea and vomiting. She also reports 1 episode of loose stool. She reports typically GI cocktail as well as improve her symptoms as well as ranitidine. She denies any fevers, chills, unusual vaginal bleeding or discharge. She currently rates her discomfort as a 100. No other modifying factors  Past Medical History  Diagnosis Date  . IBS (irritable bowel syndrome) 05/31/2011    sees Alonza Bogus in GI - hx rectal bleeding  . GERD (gastroesophageal reflux disease) 05/31/2011    and nausea, sees Alonza Bogus in [place]  . Optic neuritis 07/12/2011    sees opthomologist  . Allergic rhinitis 07/13/2011  . Migraines 07/13/2011  . Bell's palsy 07/13/2011    Hx of, used to see Dr. Jacelyn Grip  . Anxiety attack 11/13/2011  . Chest pain, atypical 11/29/2011  . Folliculitis [id number]  . 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 Urticaria 01/11/2012  . Other and unspecified ovarian cysts   . 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 Gonorrhea 02/08/2013  . Abnormal thyroid function test 03/15/2013    once, all normal since  . History of Meniere's disease 09/14/2011   Past Surgical History  Procedure Laterality Date  . Rhinoplasty      at 34 yo  . Cheek bones corrected      at 34 yo  . Breast enhancement surgery  2008  . Appendectomy  2006    Enlarged  . Upper  gastrointestinal endoscopy     Family History  Problem Relation Age of Onset  . Stroke Mother   . Heart disease Mother   . Hyperlipidemia Mother   . Rheum arthritis Mother   . Fibroids Mother     Uterine  . Fibrocystic breast disease Mother   . Other Mother     gastritis  . Hyperlipidemia Maternal Grandmother   . Osteoarthritis Maternal Grandmother   . Glaucoma Maternal Grandmother   . Kidney disease Maternal Grandmother   . Cancer Maternal Grandfather     Skin, prostate, throat, stomach  . Colon cancer Maternal Grandfather   . Breast cancer Maternal Aunt   . Other Father     gastritis  . Heart disease Father     arteriosclerosis  . Arthritis Father     femoral head necrosis b/l   Social History  Substance Use Topics  . Smoking status: Never Smoker   . Smokeless tobacco: Never Used  . Alcohol Use: 0.0 oz/week    0 Standard drinks or equivalent per week     Comment: ocassional-2-3 weekly   OB History    No data available     Review of Systems A 10 point review of systems was completed and was negative except for pertinent positives and negatives as mentioned in the history of present illness     Allergies  Iodinated diagnostic agents  Home Medications   Prior to Admission medications  Medication Sig Start Date End Date Taking? Authorizing Provider  aspirin-acetaminophen-caffeine (EXCEDRIN MIGRAINE) [phone] MG per tablet Take 1 tablet by mouth every 6 (six) hours as needed. For migraine   Yes Historical Provider, MD  diphenhydrAMINE (BENADRYL) 25 MG tablet Take 25 mg by mouth at bedtime as needed. For allergies 07/12/11  Yes Mosie Lukes, MD  olopatadine (PATANOL) 0.1 % ophthalmic solution Place 1 drop into both eyes daily as needed. For allergies 09/05/11  Yes Mosie Lukes, MD  omeprazole (PRILOSEC) 20 MG capsule TAKE 1 CAPSULE (20 MG TOTAL) BY MOUTH DAILY. 03/12/14  Yes Irene Shipper, MD  ondansetron (ZOFRAN-ODT) 4 MG disintegrating tablet Take 4 mg by mouth  every 8 (eight) hours as needed for nausea or vomiting.   Yes Historical Provider, MD  OXcarbazepine ER 300 MG TB24 Take 300 mg by mouth daily. 02/16/15  Yes Donika K Patel, DO  acyclovir (ZOVIRAX) 800 MG tablet Take 1 tablet (800 mg total) by mouth 3 (three) times daily. Patient not taking: Reported on 06/05/2015 04/23/13   Mosie Lukes, MD  hyoscyamine (LEVSIN SL) 0.125 MG SL tablet PLACE 1-2 TABLETS UNDER TONGUE EVERY 4 HOURS DAILY AS NEEDED FOR PAIN Patient not taking: Reported on 06/05/2015 12/14/14   Irene Shipper, MD  montelukast (SINGULAIR) 10 MG tablet TAKE 1 TABLET (10 MG TOTAL) BY MOUTH DAILY AS NEEDED. Patient not taking: Reported on 06/05/2015 10/12/14   Lucretia Kern, DO   BP 98/64 mmHg  Pulse 69  Temp(Src) 97.8 F (36.6 C) (Oral)  Resp 18  Ht 4\' 11"  (1.499 m)  Wt 50.803 kg  BMI 22.61 kg/m2  SpO2 98%  LMP 05/06/2015 Physical Exam  Constitutional: She is oriented to person, place, and time. She appears well-developed and well-nourished.  HENT:  Head: Normocephalic and atraumatic.  Mouth/Throat: Oropharynx is clear and moist.  Eyes: Conjunctivae are normal. Pupils are equal, round, and reactive to light. Right eye exhibits no discharge. Left eye exhibits no discharge. No scleral icterus.  Neck: Neck supple.  Cardiovascular: Normal rate, regular rhythm and normal heart sounds.   Pulmonary/Chest: Effort normal and breath sounds normal. No respiratory distress. She has no wheezes. She has no rales.  Abdominal: Soft.  Abdomen soft, nondistended. No focal tenderness. No rebound or guarding.  Musculoskeletal: She exhibits no tenderness.  Neurological: She is alert and oriented to person, place, and time.  Cranial Nerves II-XII grossly intact  Skin: Skin is warm and dry. No rash noted.  Psychiatric: She has a normal mood and affect.  Nursing note and vitals reviewed.   ED Course  Procedures (including critical care time) Labs Review Labs Reviewed  COMPREHENSIVE METABOLIC  PANEL - Abnormal; Notable for the following:    Potassium 3.4 (*)    Total Protein 8.7 (*)    All other components within normal limits  URINALYSIS, ROUTINE W REFLEX MICROSCOPIC (NOT AT [facility]) - Abnormal; Notable for the following:    APPearance CLOUDY (*)    Ketones, ur 15 (*)    All other components within normal limits  LIPASE, BLOOD  CBC  I-STAT BETA HCG BLOOD, ED ([facility], [facility], [facility] ONLY)    Imaging Review No results found. I have personally reviewed and evaluated these images and lab results as part of my medical decision-making.   EKG Interpretation None     Meds given in ED:  Medications  gi cocktail (Maalox,Lidocaine,Donnatal) (30 mLs Oral Given 06/05/15 1716)  famotidine (PEPCID) tablet 20 mg (20 mg Oral  Given 06/05/15 1716)    Discharge Medication List as of 06/05/2015  6:30 PM     Filed Vitals:   06/05/15 1800 06/05/15 1803 06/05/15 1815 06/05/15 1830  BP: 104/74 104/74 90/65 98/64   Pulse: 68 69 71 69  Temp:      TempSrc:      Resp:  20  18  Height:      Weight:      SpO2: 96% 98% 97% 98%    MDM  Eadie Ziesmer is a 34 y.o. female with a history of IBS and GERD, who comes in for evaluation of abdominal discomfort following ingestion of Zithromax. She reports multiple episodes of gastritis in the past and this feels similar. She received a GI cocktail in the ED as well as Pepcid and reports that this brought her discomfort from a 100 to a 6 and she feels much better. On arrival, she is hemodynamically stable, afebrile. She has a benign abdominal exam. She is tolerating oral fluids in the ED. She has omeprazole at home that she can continue to take. She also reports she will follow-up with her gastroenterologist next week for reevaluation. Her labs are unremarkable and not concerning. No evidence of other acute emergent pathology. Overall she appears well, nontoxic, hemodynamically stable, afebrile and is appropriate for discharge. Final diagnoses:  Abdominal discomfort       Comer Locket, PA-C 06/05/15 1855  Merrily Pew, MD 06/05/15 1945

## 2015-06-05 NOTE — ED Notes (Signed)
Pt reports recent sinus infection with n/v, was started on zithromax and given rocephin IM at pcp. Pt reports hx of gastritis and now having severe mid abd pain for several days. Denies diarrhea.

## 2015-06-05 NOTE — ED Provider Notes (Signed)
Patient returns to ED as abdominal pain has returned. She remains hemodynamically stable and afebrile. Chief complaint epigastric pain. Previous labs were noncontributory. However, given patient's exquisite pain will obtain CT abdomen for further evaluation of her discomfort. Ordered H. pylori for PCP follow-up. Patient care will be signed out to oncoming provider, Alyse Low PA-C, for follow-up on CT abdomen. If negative and no new objective findings, patient may be discharged home with antiemetics, short course pain medicines and instructions for close follow-up with PCP.  Comer Locket, PA-C 06/06/15 1002  Merrily Pew, MD 06/10/15 [phone]

## 2015-06-05 NOTE — ED Notes (Signed)
Pt. reports worsening upper abdominal pain this evening , no emesis or diarrhea. Seen here this evening discharged home returned for the same complaint.

## 2015-06-05 NOTE — Discharge Instructions (Signed)
There does not appear to be an emergent cause for your abdominal discomfort at this time. Your exam and labs are all very reassuring. Please follow-up with your PCP/gastroenterologist next week for reevaluation. Return to ED for any new or worsening symptoms.  Abdominal Pain, Adult Many things can cause abdominal pain. Usually, abdominal pain is not caused by a disease and will improve without treatment. It can often be observed and treated at home. Your health care provider will do a physical exam and possibly order blood tests and X-rays to help determine the seriousness of your pain. However, in many cases, more time must pass before a clear cause of the pain can be found. Before that point, your health care provider may not know if you need more testing or further treatment. HOME CARE INSTRUCTIONS Monitor your abdominal pain for any changes. The following actions may help to alleviate any discomfort you are experiencing:  Only take over-the-counter or prescription medicines as directed by your health care provider.  Do not take laxatives unless directed to do so by your health care provider.  Try a clear liquid diet (broth, tea, or water) as directed by your health care provider. Slowly move to a bland diet as tolerated. SEEK MEDICAL CARE IF:  You have unexplained abdominal pain.  You have abdominal pain associated with nausea or diarrhea.  You have pain when you urinate or have a bowel movement.  You experience abdominal pain that wakes you in the night.  You have abdominal pain that is worsened or improved by eating food.  You have abdominal pain that is worsened with eating fatty foods.  You have a fever. SEEK IMMEDIATE MEDICAL CARE IF:  Your pain does not go away within 2 hours.  You keep throwing up (vomiting).  Your pain is felt only in portions of the abdomen, such as the right side or the left lower portion of the abdomen.  You pass bloody or black tarry stools. MAKE  SURE YOU:  Understand these instructions.  Will watch your condition.  Will get help right away if you are not doing well or get worse.   This information is not intended to replace advice given to you by your health care provider. Make sure you discuss any questions you have with your health care provider.   Document Released: 03/28/2005 Document Revised: 03/09/2015 Document Reviewed: 02/25/2013 Elsevier Interactive Patient Education [employer].

## 2015-06-06 LAB — H. PYLORI ANTIBODY, IGG

## 2015-06-06 MED ORDER — CIPROFLOXACIN HCL 500 MG PO TABS: 500.0000 mg | ORAL_TABLET | Freq: Two times a day (BID) | ORAL | Status: DC

## 2015-06-06 MED ORDER — GI COCKTAIL ~~LOC~~
30.0000 mL | Freq: Once | ORAL | Status: AC
  Administered 2015-06-06: 30 mL via ORAL
  Filled 2015-06-06: qty 30

## 2015-06-06 MED ORDER — METRONIDAZOLE 500 MG PO TABS: 500.0000 mg | ORAL_TABLET | Freq: Three times a day (TID) | ORAL | Status: DC

## 2015-06-06 MED ORDER — GI COCKTAIL ~~LOC~~: 30.0000 mL | Freq: Two times a day (BID) | ORAL | Status: DC | PRN

## 2015-06-06 NOTE — ED Provider Notes (Signed)
Ct discussed with radiologist. Ct concerning for enteritis.  He reports intussusception is not unusual and transient in adults.  I discussed with Dr. Dolly Rias.   Pt counseled on xray findings.   Pt will stop zithromax.  She wants to wait to see if symptoms resolve before starting cipro or flagyl.   Pt and husband think problems may be from antibiotics.  Pt request Gi cocktail prescription.  Pt is advised to follow up with her MD for recheck.  Return if syptoms worsen or change.  Hollace Kinnier [place], PA-C 06/06/15 0127  Merrily Pew, MD 06/10/15 [phone]

## 2015-06-06 NOTE — Discharge Instructions (Signed)
Please follow up with your doctor in 2 days for re-evaluation. Return to ED for any new or worsening symptoms. Take your medications as prescribed, but not before driving or operating machinery.  Abdominal Pain, Adult Many things can cause abdominal pain. Usually, abdominal pain is not caused by a disease and will improve without treatment. It can often be observed and treated at home. Your health care provider will do a physical exam and possibly order blood tests and X-rays to help determine the seriousness of your pain. However, in many cases, more time must pass before a clear cause of the pain can be found. Before that point, your health care provider may not know if you need more testing or further treatment. HOME CARE INSTRUCTIONS Monitor your abdominal pain for any changes. The following actions may help to alleviate any discomfort you are experiencing:  Only take over-the-counter or prescription medicines as directed by your health care provider.  Do not take laxatives unless directed to do so by your health care provider.  Try a clear liquid diet (broth, tea, or water) as directed by your health care provider. Slowly move to a bland diet as tolerated. SEEK MEDICAL CARE IF:  You have unexplained abdominal pain.  You have abdominal pain associated with nausea or diarrhea.  You have pain when you urinate or have a bowel movement.  You experience abdominal pain that wakes you in the night.  You have abdominal pain that is worsened or improved by eating food.  You have abdominal pain that is worsened with eating fatty foods.  You have a fever. SEEK IMMEDIATE MEDICAL CARE IF:  Your pain does not go away within 2 hours.  You keep throwing up (vomiting).  Your pain is felt only in portions of the abdomen, such as the right side or the left lower portion of the abdomen.  You pass bloody or black tarry stools. MAKE SURE YOU:  Understand these instructions.  Will watch your  condition.  Will get help right away if you are not doing well or get worse.   This information is not intended to replace advice given to you by your health care provider. Make sure you discuss any questions you have with your health care provider.   Document Released: 03/28/2005 Document Revised: 03/09/2015 Document Reviewed: 02/25/2013 Elsevier Interactive Patient Education 2016 Elsevier [employer] Colitis Colitis is inflammation of the colon. Colitis may last a short time (acute) or it may last a long time (chronic). CAUSES This condition may be caused by:  Viruses.  Bacteria.  Reactions to medicine.  Certain autoimmune diseases, such as Crohn disease or ulcerative colitis. SYMPTOMS Symptoms of this condition include:  Diarrhea.  Passing bloody or tarry stool.  Pain.  Fever.  Vomiting.  Tiredness (fatigue).  Weight loss.  Bloating.  Sudden increase in abdominal pain.  Having fewer bowel movements than usual. DIAGNOSIS This condition is diagnosed with a stool test or a blood test. You may also have other tests, including X-rays, a CT scan, or a colonoscopy. TREATMENT Treatment may include:  Resting the bowel. This involves not eating or drinking for a period of time.  Fluids that are given through an IV tube.  Medicine for pain and diarrhea.  Antibiotic medicines.  Cortisone medicines.  Surgery. HOME CARE INSTRUCTIONS Eating and Drinking  Follow instructions from your health care provider about eating or drinking restrictions.  Drink enough fluid to keep your urine clear or pale yellow.  Work with a Microbiologist to determine  which foods cause your condition to flare up.  Avoid foods that cause flare-ups.  Eat a well-balanced diet. Medicines  Take over-the-counter and prescription medicines only as told by your health care provider.  If you were prescribed an antibiotic medicine, take it as told by your health care provider. Do not stop taking the  antibiotic even if you start to feel better. General Instructions  Keep all follow-up visits as told by your health care provider. This is important. SEEK MEDICAL CARE IF:  Your symptoms do not go away.  You develop new symptoms. SEEK IMMEDIATE MEDICAL CARE IF:  You have a fever that does not go away with treatment.  You develop chills.  You have extreme weakness, fainting, or dehydration.  You have repeated vomiting.  You develop severe pain in your abdomen.  You pass bloody or tarry stool.   This information is not intended to replace advice given to you by your health care provider. Make sure you discuss any questions you have with your health care provider.   Document Released: 07/26/2004 Document Revised: 03/09/2015 Document Reviewed: 10/11/2014 Elsevier Interactive Patient Education [employer].

## 2015-07-08 ENCOUNTER — Encounter: Payer: Self-pay | Admitting: Family Medicine

## 2015-07-08 ENCOUNTER — Telehealth: Payer: Self-pay | Admitting: Family Medicine

## 2015-07-08 ENCOUNTER — Ambulatory Visit (INDEPENDENT_AMBULATORY_CARE_PROVIDER_SITE_OTHER): Payer: Managed Care, Other (non HMO) | Admitting: Family Medicine

## 2015-07-08 VITALS — BP 96/54 | HR 64 | Temp 97.9°F | Ht 59.0 in | Wt 113.2 lb

## 2015-07-08 DIAGNOSIS — K219 Gastro-esophageal reflux disease without esophagitis: Secondary | ICD-10-CM

## 2015-07-08 DIAGNOSIS — R1013 Epigastric pain: Secondary | ICD-10-CM | POA: Diagnosis not present

## 2015-07-08 DIAGNOSIS — K760 Fatty (change of) liver, not elsewhere classified: Secondary | ICD-10-CM

## 2015-07-08 DIAGNOSIS — T7840XD Allergy, unspecified, subsequent encounter: Secondary | ICD-10-CM

## 2015-07-08 DIAGNOSIS — K589 Irritable bowel syndrome without diarrhea: Secondary | ICD-10-CM

## 2015-07-08 DIAGNOSIS — R197 Diarrhea, unspecified: Secondary | ICD-10-CM

## 2015-07-08 DIAGNOSIS — E782 Mixed hyperlipidemia: Secondary | ICD-10-CM

## 2015-07-08 LAB — COMPREHENSIVE METABOLIC PANEL
ALK PHOS: 76 U/L (ref 39–117)
ALT: 17 U/L (ref 0–35)
AST: 18 U/L (ref 0–37)
Albumin: 4.4 g/dL (ref 3.5–5.2)
BUN: 18 mg/dL (ref 6–23)
CO2: 29 mEq/L (ref 19–32)
Calcium: 9.9 mg/dL (ref 8.4–10.5)
Chloride: 105 mEq/L (ref 96–112)
Creatinine, Ser: 0.69 mg/dL (ref 0.40–1.20)
GFR: 103.54 mL/min (ref >60.00)
GLUCOSE: 89 mg/dL (ref 70–99)
POTASSIUM: 4.3 meq/L (ref 3.5–5.1)
SODIUM: 139 meq/L (ref 135–145)
TOTAL PROTEIN: 7.9 g/dL (ref 6.0–8.3)
Total Bilirubin: 0.4 mg/dL (ref 0.2–1.2)

## 2015-07-08 NOTE — Progress Notes (Signed)
Pre visit review using our clinic review tool, if applicable. No additional management support is needed unless otherwise documented below in the visit note. 

## 2015-07-08 NOTE — Telephone Encounter (Signed)
Please put two visits together or squeeze her in somewhere will come in early if I need to

## 2015-07-08 NOTE — Patient Instructions (Addendum)
Probiotic daily such as [place] such their 10 strain capsule, [email or web address] Metamucil/Psyllium fiber daily in 6-8 oz of fluids  Allegra in am, Benadryl in pm, flonase every day Nasal saline twice daily  Allergies An allergy is an abnormal reaction to a substance by the body's defense system (immune system). Allergies can develop at any age. WHAT CAUSES ALLERGIES? An allergic reaction happens when the immune system mistakenly reacts to a normally harmless substance, called an allergen, as if it were harmful. The immune system releases antibodies to fight the substance. Antibodies eventually release a chemical called histamine into the bloodstream. The release of histamine is meant to protect the body from infection, but it also causes discomfort. An allergic reaction can be triggered by:  Eating an allergen.  Inhaling an allergen.  Touching an allergen. WHAT TYPES OF ALLERGIES ARE THERE? There are many types of allergies. Common types include:  Seasonal allergies. People with this type of allergy are usually allergic to substances that are only present during certain seasons, such as molds and pollens.  Food allergies.  Drug allergies.  Insect allergies.  Animal dander allergies. WHAT ARE SYMPTOMS OF ALLERGIES? Possible allergy symptoms include:  Swelling of the lips, face, tongue, mouth, or throat.  Sneezing, coughing, or wheezing.  Nasal congestion.  Tingling in the mouth.  Rash.  Itching.  Itchy, red, swollen areas of skin (hives).  Watery eyes.  Vomiting.  Diarrhea.  Dizziness.  Lightheadedness.  Fainting.  Trouble breathing or swallowing.  Chest tightness.  Rapid heartbeat. HOW ARE ALLERGIES DIAGNOSED? Allergies are diagnosed with a medical and family history and one or more of the following:  Skin tests.  Blood tests.  A food diary. A food diary is a record of all the foods and drinks you have in a day and of all the symptoms you  experience.  The results of an elimination diet. An elimination diet involves eliminating foods from your diet and then adding them back in one by one to find out if a certain food causes an allergic reaction. HOW ARE ALLERGIES TREATED? There is no cure for allergies, but allergic reactions can be treated with medicine. Severe reactions usually need to be treated at a hospital. HOW CAN REACTIONS BE PREVENTED? The best way to prevent an allergic reaction is by avoiding the substance you are allergic to. Allergy shots and medicines can also help prevent reactions in some cases. People with severe allergic reactions may be able to prevent a life-threatening reaction called anaphylaxis with a medicine given right after exposure to the allergen.   This information is not intended to replace advice given to you by your health care provider. Make sure you discuss any questions you have with your health care provider.   Document Released: 09/11/2002 Document Revised: 07/09/2014 Document Reviewed: 03/30/2014 Elsevier Interactive Patient Education 2016 [employer].  daily  Elderberry capsule twice daily

## 2015-07-08 NOTE — Telephone Encounter (Signed)
Caller name: [place]  Relationship to patient: Self   Can be reached: [phone]  Reason for call: At checkout, AVS showing to schedule pt for CPE in 5 mths. However scheduling isn't showing an available slot, pt says that she discussed with provider that she has to have CPE before June, she would like to know if she can be worked in sooner than available slot in July

## 2015-07-12 ENCOUNTER — Encounter: Payer: Self-pay | Admitting: Family Medicine

## 2015-07-14 NOTE — Telephone Encounter (Signed)
Pt has been scheduled.  °

## 2015-07-17 ENCOUNTER — Encounter: Payer: Self-pay | Admitting: Family Medicine

## 2015-07-17 DIAGNOSIS — K760 Fatty (change of) liver, not elsewhere classified: Secondary | ICD-10-CM

## 2015-07-17 HISTORY — DX: Fatty (change of) liver, not elsewhere classified: K76.0

## 2015-07-17 NOTE — Assessment & Plan Note (Signed)
Struggling, encouraged daily antihistamines, Singulair and nasal steroids daily. Add nasal saline and may need

## 2015-07-17 NOTE — Assessment & Plan Note (Signed)
Encouraged probiotics and fiber supplements daily. Maintain a bland diet.

## 2015-07-17 NOTE — Assessment & Plan Note (Signed)
Repeat ultrasound confrms persistent fatty liver, encouraged to minimize simple carbs and stay active.

## 2015-07-17 NOTE — Progress Notes (Signed)
Patient ID: Danielle Sims, female   DOB: [birth year], 35 y.o.   MRN: [id number]   Subjective:    Patient ID: Danielle Sims, female    DOB: [birth year], 35 y.o.   MRN: [id number]  Chief Complaint  Patient presents with  . Annual Exam    c/o of sinuitis pt is fasting    HPI Patient is in today for evaluation of numerous concerns. She has been struggling with intermittent abdominal pain. She has noted constipation but denies bloody or tarry stool. She presented to the ER with severe colicky abdominal pain mostly in the epigastric. GI cocktail and Zofran was helpful. She was switched from omeprazole to pantoprazole and she does note her symptoms are improving. She had been treated in urgent care prior to this with azithromycin and Rocephin secondary to a sinus infection. Her sinus infection is improving. No fevers or chills. Denies CP/palp/SOB/HA/congestion/fevers or GU c/o. Taking meds as prescribed  Past Medical History  Diagnosis Date  . IBS (irritable bowel syndrome) 05/31/2011    sees Alonza Bogus in GI - hx rectal bleeding  . GERD (gastroesophageal reflux disease) 05/31/2011    and nausea, sees Alonza Bogus in [place]  . Optic neuritis 07/12/2011    sees opthomologist  . Allergic rhinitis 07/13/2011  . Migraines 07/13/2011  . Bell's palsy 07/13/2011    Hx of, used to see Dr. Jacelyn Grip  . Anxiety attack 11/13/2011  . Chest pain, atypical 11/29/2011  . Folliculitis [id number]  . 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 Urticaria 01/11/2012  . Other and unspecified ovarian cysts   . 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 Gonorrhea 02/08/2013  . Abnormal thyroid function test 03/15/2013    once, all normal since  . History of Meniere's disease 09/14/2011  . Fatty infiltration of liver 07/17/2015    Past Surgical History  Procedure Laterality Date  . Rhinoplasty      at 35 yo  . Cheek bones corrected      at 35 yo  . Breast enhancement surgery  2008  . Appendectomy  2006    Enlarged  . Upper gastrointestinal endoscopy       Family History  Problem Relation Age of Onset  . Stroke Mother   . Heart disease Mother   . Hyperlipidemia Mother   . Rheum arthritis Mother   . Fibroids Mother     Uterine  . Fibrocystic breast disease Mother   . Other Mother     gastritis  . Hyperlipidemia Maternal Grandmother   . Osteoarthritis Maternal Grandmother   . Glaucoma Maternal Grandmother   . Kidney disease Maternal Grandmother   . Cancer Maternal Grandfather     Skin, prostate, throat, stomach  . Colon cancer Maternal Grandfather   . Breast cancer Maternal Aunt   . Other Father     gastritis  . Heart disease Father     arteriosclerosis  . Arthritis Father     femoral head necrosis b/l    Social History   Social History  . Marital Status: Married    Spouse Name: N/A  . Number of Children: 1  . Years of Education: N/A   Occupational History  . CMA    Social History Main Topics  . Smoking status: Never Smoker   . Smokeless tobacco: Never Used  . Alcohol Use: 0.0 oz/week    0 Standard drinks or equivalent per week     Comment: ocassional-2-3 weekly  . Drug Use: No  . Sexual Activity:    Partners:  Male   Other Topics Concern  . 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 acyclovir (ZOVIRAX) 800 MG tablet Take 1 tablet (800 mg total) by mouth 3 (three) times daily. 21 tablet 1  . aspirin-acetaminophen-caffeine (EXCEDRIN MIGRAINE) [id number] MG per tablet Take 1 tablet by mouth every 6 (six) hours as needed. For migraine    . diphenhydrAMINE (BENADRYL) 25 MG tablet Take 25 mg by mouth at bedtime as needed. For allergies    . hyoscyamine (LEVSIN SL) 0.125 MG SL tablet PLACE 1-2  TABLETS UNDER TONGUE EVERY 4 HOURS DAILY AS NEEDED FOR PAIN 30 tablet 0  . montelukast (SINGULAIR) 10 MG tablet TAKE 1 TABLET (10 MG TOTAL) BY MOUTH DAILY AS NEEDED. 90 tablet 3  . olopatadine (PATANOL) 0.1 % ophthalmic solution Place 1 drop into both eyes daily as needed. For allergies    . ondansetron (ZOFRAN ODT) 4 MG disintegrating tablet 4mg  ODT q4 hours prn nausea/vomit 4 tablet 0  . OXcarbazepine ER 300 MG TB24 Take 300 mg by mouth daily. 90 tablet 3  . Alum & Mag Hydroxide-Simeth (GI COCKTAIL) SUSP suspension Take 30 mLs by mouth 2 (two) times daily as needed for indigestion. Shake well. 150 mL 1  . ciprofloxacin (CIPRO) 500 MG tablet Take 1 tablet (500 mg total) by mouth 2 (two) times daily. 20 tablet 0  . HYDROcodone-acetaminophen (NORCO) 5-325 MG tablet Take 2 tablets by mouth every 4 (four) hours as needed. 6 tablet 0  . metroNIDAZOLE (FLAGYL) 500 MG tablet Take 1 tablet (500 mg total) by mouth 3 (three) times daily. 20 tablet 0  . omeprazole (PRILOSEC) 20 MG capsule TAKE 1 CAPSULE (20 MG TOTAL) BY MOUTH DAILY. 90 capsule 1   No facility-administered medications prior to visit.    Allergies  Allergen Reactions  . Azithromycin     gastritis  . Iodinated Diagnostic Agents Hives    Pt developed 2 hives around site of injection Lt antecubital that were itchy after leaving lobby post scan. Given 50 mg oral benadryl.     Review of Systems  Constitutional: Negative for fever and malaise/fatigue.  HENT: Negative for congestion.   Eyes: Negative for discharge.  Respiratory: Negative for shortness of breath.   Cardiovascular: Negative for chest pain, palpitations and leg swelling.  Gastrointestinal: Negative for nausea and abdominal pain.  Genitourinary: Negative for dysuria.  Musculoskeletal: Negative for falls.  Skin: Negative for rash.  Neurological: Negative for loss of consciousness and headaches.  Endo/Heme/Allergies: Negative for environmental allergies.    Psychiatric/Behavioral: Negative for depression. The patient is not nervous/anxious.        Objective:    Physical Exam  Constitutional: She is oriented to person, place, and time. She appears well-developed and well-nourished. No distress.  HENT:  Head: Normocephalic and atraumatic.  Nose: Nose normal.  Eyes: Right eye exhibits no discharge. Left eye exhibits no discharge.  Neck: Normal range of motion. Neck supple.  Cardiovascular: Normal rate and regular rhythm.   No murmur heard. Pulmonary/Chest: Effort normal and breath sounds normal.  Abdominal: Soft. Bowel  sounds are normal. There is no tenderness.  Musculoskeletal: She exhibits no edema.  Neurological: She is alert and oriented to person, place, and time.  Skin: Skin is warm and dry.  Psychiatric: She has a normal mood and affect.  Nursing note and vitals reviewed.   BP 96/54 mmHg  Pulse 64  Temp(Src) 97.9 F (36.6 C) (Oral)  Ht 4\' 11"  (1.499 m)  Wt 113 lb 3.2 oz (51.347 kg)  BMI 22.85 kg/m2  SpO2 99% Wt Readings from Last 3 Encounters:  07/08/15 113 lb 3.2 oz (51.347 kg)  06/05/15 112 lb (50.803 kg)  01/24/15 114 lb 7 oz (51.909 kg)     Lab Results  Component Value Date   WBC 10.2 06/05/2015   HGB 14.6 06/05/2015   HCT 44.3 06/05/2015   PLT 347 06/05/2015   GLUCOSE 89 07/08/2015   CHOL 188 10/12/2014   TRIG 145.0 10/12/2014   HDL 48.40 10/12/2014   LDLCALC 111* 10/12/2014   ALT 17 07/08/2015   AST 18 07/08/2015   NA 139 07/08/2015   K 4.3 07/08/2015   CL 105 07/08/2015   CREATININE 0.69 07/08/2015   BUN 18 07/08/2015   CO2 29 07/08/2015   TSH 2.04 10/12/2014   HGBA1C 5.8 10/12/2014    Lab Results  Component Value Date   TSH 2.04 10/12/2014   Lab Results  Component Value Date   WBC 10.2 06/05/2015   HGB 14.6 06/05/2015   HCT 44.3 06/05/2015   MCV 91.3 06/05/2015   PLT 347 06/05/2015   Lab Results  Component Value Date   NA 139 07/08/2015   K 4.3 07/08/2015   CO2 29 07/08/2015    GLUCOSE 89 07/08/2015   BUN 18 07/08/2015   CREATININE 0.69 07/08/2015   BILITOT 0.4 07/08/2015   ALKPHOS 76 07/08/2015   AST 18 07/08/2015   ALT 17 07/08/2015   PROT 7.9 07/08/2015   ALBUMIN 4.4 07/08/2015   CALCIUM 9.9 07/08/2015   ANIONGAP 12 06/05/2015   GFR 103.54 07/08/2015   Lab Results  Component Value Date   CHOL 188 10/12/2014   Lab Results  Component Value Date   HDL 48.40 10/12/2014   Lab Results  Component Value Date   LDLCALC 111* 10/12/2014   Lab Results  Component Value Date   TRIG 145.0 10/12/2014   Lab Results  Component Value Date   CHOLHDL 4 10/12/2014   Lab Results  Component Value Date   HGBA1C 5.8 10/12/2014       Assessment & Plan:   Problem List Items Addressed This Visit    Allergic state    Struggling, encouraged daily antihistamines, Singulair and nasal steroids daily. Add nasal saline and may need       Fatty infiltration of liver    Repeat ultrasound confrms persistent fatty liver, encouraged to minimize simple carbs and stay active.      GERD    Avoid offending foods, start probiotics. Do not eat large meals in late evening and consider raising head of bed.       Relevant Medications   pantoprazole (PROTONIX) 20 MG tablet   IBS (irritable bowel syndrome)    Encouraged probiotics and fiber supplements daily. Maintain a bland diet.      Relevant Medications   pantoprazole (PROTONIX) 20 MG tablet   Other Relevant Orders   TSH   CBC   Lipid panel   Comprehensive metabolic panel   T4, free    Other Visit Diagnoses  Abdominal pain, epigastric    -  Primary    Relevant Orders    US Abdomen Complete    Comprehensive metabolic panel (Completed)    TSH    CBC    Lipid panel    Comprehensive metabolic panel    T4, free    Fatty liver        Relevant Orders    US Abdomen Complete    TSH    CBC    Lipid panel    Comprehensive metabolic panel    T4, free    Diarrhea, unspecified type        Relevant Orders     TSH    CBC    Lipid panel    Comprehensive metabolic panel    T4, free    Hyperlipidemia, mixed        Relevant Orders    TSH    CBC    Lipid panel    Comprehensive metabolic panel    T4, free       I have discontinued Ms. Righetti's omeprazole, HYDROcodone-acetaminophen, ciprofloxacin, metroNIDAZOLE, and gi cocktail. I am also having her maintain her aspirin-acetaminophen-caffeine, olopatadine, diphenhydrAMINE, acyclovir, montelukast, hyoscyamine, OXcarbazepine ER, ondansetron, fluticasone, predniSONE, and pantoprazole.  Meds ordered this encounter  Medications  . fluticasone (FLONASE) 50 MCG/ACT nasal spray    Sig: Place 2 sprays into both nostrils daily.    Refill:  0  . predniSONE (DELTASONE) 20 MG tablet    Sig: Take 1 tablet by mouth as needed.  . pantoprazole (PROTONIX) 20 MG tablet    Sig: Take 20 mg by mouth daily.     Penni Homans, MD

## 2015-07-17 NOTE — Assessment & Plan Note (Signed)
Avoid offending foods, start probiotics. Do not eat large meals in late evening and consider raising head of bed.  

## 2015-08-03 ENCOUNTER — Telehealth: Payer: Self-pay | Admitting: Family Medicine

## 2015-08-03 DIAGNOSIS — Z1159 Encounter for screening for other viral diseases: Secondary | ICD-10-CM

## 2015-08-03 NOTE — Telephone Encounter (Signed)
Caller name: Self  Can be reached: [phone]   Reason for call: Patient states she needs TB test and may need other titers for school. She is registering for college and they require her to be updated on all of her vaccinations. States she will explain what is needed when called back

## 2015-08-04 NOTE — Telephone Encounter (Signed)
Put order in for hep B titer, scheduler her a lab appointment and nurse visit appointment for TB skin test 08/30/2015

## 2015-08-04 NOTE — Telephone Encounter (Signed)
Please arrange for her to come in for Hep B surface antibody, quantitative and to have a PPD placed for preventative services

## 2015-08-04 NOTE — Telephone Encounter (Signed)
The patient is in need of a PPD for college and hepatitis b titer.

## 2015-08-30 ENCOUNTER — Other Ambulatory Visit: Payer: Managed Care, Other (non HMO)

## 2015-08-30 ENCOUNTER — Ambulatory Visit (INDEPENDENT_AMBULATORY_CARE_PROVIDER_SITE_OTHER): Payer: Managed Care, Other (non HMO) | Admitting: *Deleted

## 2015-08-30 ENCOUNTER — Telehealth: Payer: Self-pay | Admitting: *Deleted

## 2015-08-30 DIAGNOSIS — Z111 Encounter for screening for respiratory tuberculosis: Secondary | ICD-10-CM

## 2015-08-30 DIAGNOSIS — Z1159 Encounter for screening for other viral diseases: Secondary | ICD-10-CM

## 2015-08-30 NOTE — Progress Notes (Signed)
Pre visit review using our clinic review tool, if applicable. No additional management support is needed unless otherwise documented below in the visit note.  Pt here for TB skin test. Pt has h/o of positive PPD, so TB skin test not placed. Orders placed per Elyn Aquas, PA-C for Quantifer[facility] gold lab. Pt walked to lab for blood work.   Pt also requested flu vaccine, however it is documented under Health Maintenance that pt is not a candidate for influenza. Pt states she was told by neuro in the past not to get flu shot d/t h/o Bell's palsy. Pt informed that she will need clearance from neurology or PCP before receiving vaccine.  Dorrene German, RN

## 2015-08-30 NOTE — Telephone Encounter (Signed)
Pt came into office today for TB skin test, unable to place as pt had h/o positive PPD. She also requested flu shot at visit, but it is documented under Health Maintenance that pt is not a candidate for flu vaccine. Pt states she was told at one point not to have a flu shot at that time d/t her h/o Bell's Palsy, but was not told that she couldn't ever have a flu shot again. Pt advised that PCP or neurology would need to sign off in order for her to receive vaccine. Please advise if it is ok for pt to have flu shot.

## 2015-08-30 NOTE — Telephone Encounter (Signed)
She is a candidate for flu shot moving forward, can change status in computer. With positive PPD if she needs documentation for work we can proceed with CXR to prove no active disease. carol

## 2015-08-31 LAB — HEPATITIS B SURFACE ANTIBODY, QUANTITATIVE: HEPATITIS B-POST: 647 m[IU]/mL

## 2015-08-31 NOTE — Telephone Encounter (Signed)
LMOM for pt to call the office to schedule flu shot. Pt has immunization paperwork that needs to be filled out for school, so recommend pt waits until end of this week/beginning of next week to schedule appt as her TB labs are still pending at this time.

## 2015-09-01 LAB — QUANTIFERON TB GOLD ASSAY (BLOOD)
INTERFERON GAMMA RELEASE ASSAY: NEGATIVE
Mitogen-Nil: >10 IU/mL
QUANTIFERON NIL VALUE: 0.02 [IU]/mL
QUANTIFERON TB AG MINUS NIL: 0.05 [IU]/mL

## 2015-09-08 ENCOUNTER — Ambulatory Visit (INDEPENDENT_AMBULATORY_CARE_PROVIDER_SITE_OTHER): Payer: Managed Care, Other (non HMO) | Admitting: *Deleted

## 2015-09-08 DIAGNOSIS — Z23 Encounter for immunization: Secondary | ICD-10-CM

## 2015-09-12 ENCOUNTER — Other Ambulatory Visit: Payer: Self-pay | Admitting: Gastroenterology

## 2015-09-14 ENCOUNTER — Telehealth: Payer: Self-pay | Admitting: Family Medicine

## 2015-09-14 NOTE — Telephone Encounter (Signed)
Relation to [id number] Call back [phone]    Reason for call:  Patient had the 1st and 2nd Hep B in [place] when she was a little girl. 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 Please advise if I can schedule nurse visit only.

## 2015-09-15 NOTE — Telephone Encounter (Signed)
Yes OK to schedule appt bor hep B shot. I do not remember if she has proof of the first 2 if she does not we will need to check for immunity proof with labwork in a month or two. If she does make sure we have it.

## 2015-09-19 NOTE — Telephone Encounter (Signed)
Patient scheduled for 09/27/2015 nurse visit only

## 2015-09-27 ENCOUNTER — Ambulatory Visit (INDEPENDENT_AMBULATORY_CARE_PROVIDER_SITE_OTHER): Payer: Managed Care, Other (non HMO) | Admitting: *Deleted

## 2015-09-27 DIAGNOSIS — Z23 Encounter for immunization: Secondary | ICD-10-CM | POA: Diagnosis not present

## 2015-09-27 NOTE — Progress Notes (Signed)
Pre visit review using our clinic review tool, if applicable. No additional management support is needed unless otherwise documented below in the visit note.  Pt tolerated injection well. No S/S of a reaction upon leaving the clinic.   Maxxon Schwanke J Jhan Conery, RN  

## 2015-09-29 ENCOUNTER — Telehealth: Payer: Self-pay

## 2015-09-29 NOTE — Telephone Encounter (Signed)
Rx refill request for Hyoscyamine 0.125mg  1-2- tabs as needed for pain. Last seen on 1-14-201. No follow up booked at this time. Please advise.

## 2015-09-30 MED ORDER — HYOSCYAMINE SULFATE 0.125 MG SL SUBL: SUBLINGUAL_TABLET | SUBLINGUAL | Status: DC

## 2015-09-30 NOTE — Telephone Encounter (Signed)
Refilled Hyoscyamine 1-2 tabs every 4 hours as needed #30 with 6 refills.

## 2015-09-30 NOTE — Telephone Encounter (Signed)
OK to refill

## 2015-10-27 ENCOUNTER — Other Ambulatory Visit: Payer: Self-pay | Admitting: Family Medicine

## 2015-10-31 ENCOUNTER — Ambulatory Visit: Payer: Managed Care, Other (non HMO) | Admitting: Physician Assistant

## 2015-11-11 ENCOUNTER — Ambulatory Visit (INDEPENDENT_AMBULATORY_CARE_PROVIDER_SITE_OTHER): Payer: Managed Care, Other (non HMO) | Admitting: Family Medicine

## 2015-11-11 ENCOUNTER — Encounter: Payer: Self-pay | Admitting: Family Medicine

## 2015-11-11 VITALS — BP 104/64 | HR 64 | Temp 98.5°F | Ht 59.0 in | Wt 116.5 lb

## 2015-11-11 DIAGNOSIS — K219 Gastro-esophageal reflux disease without esophagitis: Secondary | ICD-10-CM | POA: Diagnosis not present

## 2015-11-11 DIAGNOSIS — K589 Irritable bowel syndrome without diarrhea: Secondary | ICD-10-CM

## 2015-11-11 DIAGNOSIS — R5383 Other fatigue: Secondary | ICD-10-CM | POA: Insufficient documentation

## 2015-11-11 DIAGNOSIS — T7840XD Allergy, unspecified, subsequent encounter: Secondary | ICD-10-CM

## 2015-11-11 DIAGNOSIS — R7989 Other specified abnormal findings of blood chemistry: Secondary | ICD-10-CM | POA: Diagnosis not present

## 2015-11-11 DIAGNOSIS — K760 Fatty (change of) liver, not elsewhere classified: Secondary | ICD-10-CM | POA: Diagnosis not present

## 2015-11-11 DIAGNOSIS — M25552 Pain in left hip: Secondary | ICD-10-CM

## 2015-11-11 DIAGNOSIS — N760 Acute vaginitis: Secondary | ICD-10-CM | POA: Diagnosis not present

## 2015-11-11 DIAGNOSIS — Z0001 Encounter for general adult medical examination with abnormal findings: Secondary | ICD-10-CM

## 2015-11-11 DIAGNOSIS — Z0000 Encounter for general adult medical examination without abnormal findings: Secondary | ICD-10-CM

## 2015-11-11 DIAGNOSIS — H8122 Vestibular neuronitis, left ear: Secondary | ICD-10-CM

## 2015-11-11 DIAGNOSIS — N3000 Acute cystitis without hematuria: Secondary | ICD-10-CM

## 2015-11-11 DIAGNOSIS — N390 Urinary tract infection, site not specified: Secondary | ICD-10-CM

## 2015-11-11 HISTORY — DX: Acute vaginitis: N76.0

## 2015-11-11 HISTORY — DX: Other fatigue: R53.83

## 2015-11-11 HISTORY — DX: Urinary tract infection, site not specified: N39.0

## 2015-11-11 HISTORY — DX: Pain in left hip: M25.552

## 2015-11-11 HISTORY — DX: Encounter for general adult medical examination without abnormal findings: Z00.00

## 2015-11-11 LAB — URINALYSIS
BILIRUBIN URINE: NEGATIVE
HGB URINE DIPSTICK: NEGATIVE
Ketones, ur: NEGATIVE
LEUKOCYTES UA: NEGATIVE
NITRITE: NEGATIVE
PH: 6 (ref 5.0–8.0)
Specific Gravity, Urine: 1.02 (ref 1.000–1.030)
TOTAL PROTEIN, URINE-UPE24: NEGATIVE
Urine Glucose: NEGATIVE
Urobilinogen, UA: 0.2 (ref 0.0–1.0)

## 2015-11-11 LAB — CBC
HCT: 40.6 % (ref 36.0–46.0)
HEMOGLOBIN: 13.4 g/dL (ref 12.0–15.0)
MCHC: 33.1 g/dL (ref 30.0–36.0)
MCV: 87.4 fl (ref 78.0–100.0)
PLATELETS: 385 10*3/uL (ref 150.0–400.0)
RBC: 4.64 Mil/uL (ref 3.87–5.11)
RDW: 13.9 % (ref 11.5–15.5)
WBC: 8.3 10*3/uL (ref 4.0–10.5)

## 2015-11-11 LAB — COMPREHENSIVE METABOLIC PANEL
ALK PHOS: 65 U/L (ref 39–117)
ALT: 22 U/L (ref 0–35)
AST: 20 U/L (ref 0–37)
Albumin: 4.3 g/dL (ref 3.5–5.2)
BUN: 17 mg/dL (ref 6–23)
CHLORIDE: 104 meq/L (ref 96–112)
CO2: 30 mEq/L (ref 19–32)
Calcium: 9.6 mg/dL (ref 8.4–10.5)
Creatinine, Ser: 0.66 mg/dL (ref 0.40–1.20)
GFR: 108.76 mL/min (ref >60.00)
GLUCOSE: 81 mg/dL (ref 70–99)
POTASSIUM: 3.7 meq/L (ref 3.5–5.1)
Sodium: 139 mEq/L (ref 135–145)
Total Bilirubin: 0.5 mg/dL (ref 0.2–1.2)
Total Protein: 7.6 g/dL (ref 6.0–8.3)

## 2015-11-11 LAB — LDL CHOLESTEROL, DIRECT: Direct LDL: 120 mg/dL

## 2015-11-11 LAB — LIPID PANEL
CHOLESTEROL: 206 mg/dL — AB (ref 0–200)
HDL: 42.5 mg/dL (ref >39.00)
NonHDL: 163.09
Total CHOL/HDL Ratio: 5
Triglycerides: 278 mg/dL — ABNORMAL HIGH (ref 0.0–149.0)
VLDL: 55.6 mg/dL — ABNORMAL HIGH (ref 0.0–40.0)

## 2015-11-11 LAB — TSH: TSH: 1.4 u[IU]/mL (ref 0.35–4.50)

## 2015-11-11 NOTE — Assessment & Plan Note (Addendum)
Burning, itching, well treated with Diflucan. Will check urine ancillary testing today and have her start a Now PROBIOTIC

## 2015-11-11 NOTE — Assessment & Plan Note (Addendum)
Avoid offending foods, start probiotics. Do not eat large meals in late evening and consider raising head of bed. Doing much better on protonix

## 2015-11-11 NOTE — Progress Notes (Signed)
Subjective:    Patient ID: Danielle Sims, female    DOB: [birth year], 35 y.o.   MRN: [id number]  Chief Complaint  Patient presents with  . Annual Exam    HPI Patient is in today for Annual Physical Exam.  Patient reports having some concerns with left hip pain more so when its cold weather flares up when she dances and is doing a lot of activities, does improve with rest but patient had a previous injury to hip in past from a fall.  Patient also reports having some left ear pain, which worsens when air hits ear and when swimming has been going on for about 9 years.  Denies CP/palp/SOB/HA/congestion/fevers/GI or GU c/o. Taking meds as prescribed.   Past Medical History  Diagnosis Date  . IBS (irritable bowel syndrome) 05/31/2011    sees Alonza Bogus in GI - hx rectal bleeding  . GERD (gastroesophageal reflux disease) 05/31/2011    and nausea, sees Alonza Bogus in [place]  . Optic neuritis 07/12/2011    sees opthomologist  . Allergic rhinitis 07/13/2011  . Migraines 07/13/2011  . Bell's palsy 07/13/2011    Hx of, used to see Dr. Jacelyn Grip  . Anxiety attack 11/13/2011  . Chest pain, atypical 11/29/2011  . Folliculitis [id number]  . 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 Urticaria 01/11/2012  . Other and unspecified ovarian cysts   . 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 Gonorrhea 02/08/2013  . Abnormal thyroid function test 03/15/2013    once, all normal since  . History of Meniere's disease 09/14/2011  . Fatty infiltration of liver 07/17/2015  . Preventative health care 11/11/2015  . UTI (urinary tract infection) 11/11/2015  . Vaginitis and vulvovaginitis 11/11/2015  . Fatigue 11/11/2015  . Left hip pain 11/11/2015    Has a history of left hip s/p an injury 6 years ago. Wearing heals twisted ankle in a whole and the next day her hip started hurting and has bothered her off and on since then.     Past Surgical History  Procedure Laterality Date  . Rhinoplasty      at 35 yo  . Cheek bones corrected      at 35 yo  .  Breast enhancement surgery  2008  . Appendectomy  2006    Enlarged  . Upper gastrointestinal endoscopy      Family History  Problem Relation Age of Onset  . Heart disease Mother   . Hyperlipidemia Mother   . Rheum arthritis Mother   . Fibroids Mother     Uterine  . Fibrocystic breast disease Mother   . Other Mother     gastritis  . Hypertension Mother   . Hyperlipidemia Maternal Grandmother   . Osteoarthritis Maternal Grandmother   . Glaucoma Maternal Grandmother   . Kidney disease Maternal Grandmother   . Cancer Maternal Grandfather     Skin, prostate, throat, stomach  . Colon cancer Maternal Grandfather   . Breast cancer Maternal Aunt   . Other Father     gastritis  . Heart disease Father     arteriosclerosis  . Arthritis Father     femoral head necrosis b/l  . Dementia Paternal Grandmother   . Heart disease Paternal Uncle     Social History   Social History  . Marital Status: Married    Spouse Name: N/A  . Number of Children: 1  . Years of Education: N/A   Occupational History  . CMA    Social History Main Topics  .  Smoking status: Never Smoker   . Smokeless tobacco: Never Used  . Alcohol Use: 0.0 oz/week    0 Standard drinks or equivalent per week     Comment: ocassional-2-3 weekly  . Drug Use: No  . Sexual Activity:    Partners: Male   Other Topics Concern  . Not on file   Social History Narrative   Work or School: Public relations account executive, applying for PA school in [place] Situation: lives with husband and son 53, step daughter 34 and 36      Spiritual Beliefs: Christian      Lifestyle: dance videos; diet is good       Wears seat belt regularly       Outpatient Prescriptions Prior to Visit  Medication Sig Dispense Refill  . acyclovir (ZOVIRAX) 800 MG tablet Take 1 tablet (800 mg total) by mouth 3 (three) times daily. 21 tablet 1  . aspirin-acetaminophen-caffeine (EXCEDRIN MIGRAINE) [id number] MG per tablet Take 1 tablet by  mouth every 6 (six) hours as needed. For migraine    . diphenhydrAMINE (BENADRYL) 25 MG tablet Take 25 mg by mouth at bedtime as needed. For allergies    . fluticasone (FLONASE) 50 MCG/ACT nasal spray Place 2 sprays into both nostrils daily.  0  . hyoscyamine (LEVSIN SL) 0.125 MG SL tablet PLACE 1-2 TABLETS UNDER TONGUE EVERY 4 HOURS DAILY AS NEEDED FOR PAIN 30 tablet 6  . montelukast (SINGULAIR) 10 MG tablet TAKE 1 TABLET BY MOUTH EVERY DAY AS NEEDED 90 tablet 2  . ondansetron (ZOFRAN ODT) 4 MG disintegrating tablet 4mg  ODT q4 hours prn nausea/vomit 4 tablet 0  . OXcarbazepine ER 300 MG TB24 Take 300 mg by mouth daily. 90 tablet 3  . pantoprazole (PROTONIX) 20 MG tablet Take 20 mg by mouth daily.    . predniSONE (DELTASONE) 20 MG tablet Take 1 tablet by mouth as needed.    Marland Kitchen olopatadine (PATANOL) 0.1 % ophthalmic solution Place 1 drop into both eyes daily as needed. For allergies    . pantoprazole (PROTONIX) 40 MG tablet TAKE 1 TABLET BY MOUTH EVERY DAY 90 tablet 0   No facility-administered medications prior to visit.    Allergies  Allergen Reactions  . Azithromycin     gastritis  . Iodinated Diagnostic Agents Hives    Pt developed 2 hives around site of injection Lt antecubital that were itchy after leaving lobby post scan. Given 50 mg oral benadryl.     Review of Systems  Constitutional: Negative for fever and malaise/fatigue.  HENT: Positive for ear pain. Negative for congestion.   Eyes: Negative for blurred vision.  Respiratory: Negative for shortness of breath.   Cardiovascular: Negative for chest pain, palpitations and leg swelling.  Gastrointestinal: Negative for nausea, abdominal pain and blood in stool.  Genitourinary: Negative for dysuria and frequency.  Musculoskeletal: Negative for falls.  Skin: Negative for rash.  Neurological: Negative for dizziness, loss of consciousness and headaches.  Endo/Heme/Allergies: Negative for environmental allergies.    Psychiatric/Behavioral: Negative for depression. The patient is not nervous/anxious.        Objective:    Physical Exam  Constitutional: She is oriented to person, place, and time. She appears well-developed and well-nourished. No distress.  HENT:  Head: Normocephalic and atraumatic.  Eyes: Conjunctivae are normal.  Neck: Neck supple. No thyromegaly present.  Cardiovascular: Normal rate, regular rhythm and normal heart sounds.   No murmur heard. Pulmonary/Chest: Effort normal and breath sounds  normal. No respiratory distress.  Abdominal: Soft. Bowel sounds are normal. She exhibits no distension and no mass. There is no tenderness.  Musculoskeletal: She exhibits no edema.  Lymphadenopathy:    She has no cervical adenopathy.  Neurological: She is alert and oriented to person, place, and time.  Skin: Skin is warm and dry.  Psychiatric: She has a normal mood and affect. Her behavior is normal.    BP 104/64 mmHg  Pulse 64  Temp(Src) 98.5 F (36.9 C) (Oral)  Ht 4\' 11"  (1.499 m)  Wt 116 lb 8 oz (52.844 kg)  BMI 23.52 kg/m2  SpO2 99% Wt Readings from Last 3 Encounters:  11/11/15 116 lb 8 oz (52.844 kg)  07/08/15 113 lb 3.2 oz (51.347 kg)  06/05/15 112 lb (50.803 kg)     Lab Results  Component Value Date   WBC 10.2 06/05/2015   HGB 14.6 06/05/2015   HCT 44.3 06/05/2015   PLT 347 06/05/2015   GLUCOSE 89 07/08/2015   CHOL 188 10/12/2014   TRIG 145.0 10/12/2014   HDL 48.40 10/12/2014   LDLCALC 111* 10/12/2014   ALT 17 07/08/2015   AST 18 07/08/2015   NA 139 07/08/2015   K 4.3 07/08/2015   CL 105 07/08/2015   CREATININE 0.69 07/08/2015   BUN 18 07/08/2015   CO2 29 07/08/2015   TSH 2.04 10/12/2014   HGBA1C 5.8 10/12/2014    Lab Results  Component Value Date   TSH 2.04 10/12/2014   Lab Results  Component Value Date   WBC 10.2 06/05/2015   HGB 14.6 06/05/2015   HCT 44.3 06/05/2015   MCV 91.3 06/05/2015   PLT 347 06/05/2015   Lab Results  Component Value  Date   NA 139 07/08/2015   K 4.3 07/08/2015   CO2 29 07/08/2015   GLUCOSE 89 07/08/2015   BUN 18 07/08/2015   CREATININE 0.69 07/08/2015   BILITOT 0.4 07/08/2015   ALKPHOS 76 07/08/2015   AST 18 07/08/2015   ALT 17 07/08/2015   PROT 7.9 07/08/2015   ALBUMIN 4.4 07/08/2015   CALCIUM 9.9 07/08/2015   ANIONGAP 12 06/05/2015   GFR 103.54 07/08/2015   Lab Results  Component Value Date   CHOL 188 10/12/2014   Lab Results  Component Value Date   HDL 48.40 10/12/2014   Lab Results  Component Value Date   LDLCALC 111* 10/12/2014   Lab Results  Component Value Date   TRIG 145.0 10/12/2014   Lab Results  Component Value Date   CHOLHDL 4 10/12/2014   Lab Results  Component Value Date   HGBA1C 5.8 10/12/2014       Assessment & Plan:   Problem List Items Addressed This Visit    Vestibular neuronitis - Primary    Pain generally well controlled on Oxcarbazepine but her ear ont hat side is getting increasingly tender to water, wind and cold. Encouraged to use ear plugs prn and to consider returning to neurology if symptoms worsen.      Vaginitis and vulvovaginitis    Burning, itching, well treated with Diflucan. Will check urine ancillary testing today and have her start a Now PROBIOTIC      Relevant Orders   TSH   Lipid panel   Comprehensive metabolic panel   Urine culture   Urinalysis   Ambulatory referral to Sports Medicine   CBC   UTI (urinary tract infection)    Treated by urgent care with Bactrim 2 weeks ago, is feeling much better.  Culture was not run so will test to day for resolution.      Relevant Orders   TSH   Lipid panel   Comprehensive metabolic panel   Urine culture   Urinalysis   Ambulatory referral to Sports Medicine   CBC   Preventative health care    Patient encouraged to maintain heart healthy diet, regular exercise, adequate sleep. Consider daily probiotics. Take medications as prescribed. Given and reviewed copy of ACP documents from [employer] and encouraged to complete and return      Relevant Orders   TSH   Lipid panel   Comprehensive metabolic panel   Urine culture   Urinalysis   Ambulatory referral to Sports Medicine   CBC   IBS (irritable bowel syndrome)   Relevant Orders   TSH   Lipid panel   Comprehensive metabolic panel   Urine culture   Urinalysis   Ambulatory referral to Sports Medicine   CBC   GERD    Avoid offending foods, start probiotics. Do not eat large meals in late evening and consider raising head of bed. Doing much better on protonix      Relevant Orders   TSH   Lipid panel   Comprehensive metabolic panel   Urine culture   Urinalysis   Ambulatory referral to Sports Medicine   CBC   Fatty infiltration of liver   Relevant Orders   TSH   Lipid panel   Comprehensive metabolic panel   Urine culture   Urinalysis   Ambulatory referral to Sports Medicine   CBC   Allergic state   Relevant Orders   TSH   Lipid panel   Comprehensive metabolic panel   Urine culture   Urinalysis   Ambulatory referral to Sports Medicine   CBC    Other Visit Diagnoses    Hip pain, left        Relevant Orders    TSH    Lipid panel    Comprehensive metabolic panel    Urine culture    Urinalysis    Ambulatory referral to Sports Medicine    CBC       I have discontinued Ms. Wolman's olopatadine. I am also having her maintain her aspirin-acetaminophen-caffeine, diphenhydrAMINE, acyclovir, OXcarbazepine ER, ondansetron, fluticasone, predniSONE, pantoprazole, hyoscyamine, and montelukast.  No orders of the defined types were placed in this encounter.     Penni Homans, MD

## 2015-11-11 NOTE — Assessment & Plan Note (Addendum)
Treated by urgent care with Bactrim 2 weeks ago, is feeling much better. Culture was not run so will test to day for resolution.

## 2015-11-11 NOTE — Progress Notes (Signed)
Pre visit review using our clinic review tool, if applicable. No additional management support is needed unless otherwise documented below in the visit note. 

## 2015-11-11 NOTE — Assessment & Plan Note (Signed)
Patient encouraged to maintain heart healthy diet, regular exercise, adequate sleep. Consider daily probiotics. Take medications as prescribed. Given and reviewed copy of ACP documents from [place] Secretary of State and encouraged to complete and return 

## 2015-11-11 NOTE — Assessment & Plan Note (Signed)
Has a history of left hip s/p an injury 6 years ago. Wearing heals twisted ankle in a whole and the next day her hip started hurting and has bothered her off and on since then. Flares with cold weather and increased activity and dancing to 6 of 10. With rest it improves but then returns. Will refer to sports med for evaluation. Salon Pas or Aspercreme patches prn.

## 2015-11-11 NOTE — Assessment & Plan Note (Signed)
Pain generally well controlled on Oxcarbazepine but her ear ont hat side is getting increasingly tender to water, wind and cold. Encouraged to use ear plugs prn and to consider returning to neurology if symptoms worsen.

## 2015-11-11 NOTE — Patient Instructions (Addendum)
NOW Probiotic Vitamin. Available at [employer].  Salonpas patch with lidocaine and aspercreme.   Preventive Care for Adults, Female A healthy lifestyle and preventive care can promote health and wellness. Preventive health guidelines for women include the following key practices.  A routine yearly physical is a good way to check with your health care provider about your health and preventive screening. It is a chance to share any concerns and updates on your health and to receive a thorough exam.  Visit your dentist for a routine exam and preventive care every 6 months. Brush your teeth twice a day and floss once a day. Good oral hygiene prevents tooth decay and gum disease.  The frequency of eye exams is based on your age, health, family medical history, use of contact lenses, and other factors. Follow your health care provider's recommendations for frequency of eye exams.  Eat a healthy diet. Foods like vegetables, fruits, whole grains, low-fat dairy products, and lean protein foods contain the nutrients you need without too many calories. Decrease your intake of foods high in solid fats, added sugars, and salt. Eat the right amount of calories for you.Get information about a proper diet from your health care provider, if necessary.  Regular physical exercise is one of the most important things you can do for your health. Most adults should get at least 150 minutes of moderate-intensity exercise (any activity that increases your heart rate and causes you to sweat) each week. In addition, most adults need muscle-strengthening exercises on 2 or more days a week.  Maintain a healthy weight. The body mass index (BMI) is a screening tool to identify possible weight problems. It provides an estimate of body fat based on height and weight. Your health care provider can find your BMI and can help you achieve or maintain a healthy weight.For adults 20 years and older:  A BMI below 18.5 is  considered underweight.  A BMI of 18.5 to 24.9 is normal.  A BMI of 25 to 29.9 is considered overweight.  A BMI of 30 and above is considered obese.  Maintain normal blood lipids and cholesterol levels by exercising and minimizing your intake of saturated fat. Eat a balanced diet with plenty of fruit and vegetables. Blood tests for lipids and cholesterol should begin at age 73 and be repeated every 5 years. If your lipid or cholesterol levels are high, you are over 50, or you are at high risk for heart disease, you may need your cholesterol levels checked more frequently.Ongoing high lipid and cholesterol levels should be treated with medicines if diet and exercise are not working.  If you smoke, find out from your health care provider how to quit. If you do not use tobacco, do not start.  Lung cancer screening is recommended for adults aged 81-80 years who are at high risk for developing lung cancer because of a history of smoking. A yearly low-dose CT scan of the lungs is recommended for people who have at least a 30-pack-year history of smoking and are a current smoker or have quit within the past 15 years. A pack year of smoking is smoking an average of 1 pack of cigarettes a day for 1 year (for example: 1 pack a day for 30 years or 2 packs a day for 15 years). Yearly screening should continue until the smoker has stopped smoking for at least 15 years. Yearly screening should be stopped for people who develop a health problem that would prevent them from  having lung cancer treatment.  If you are pregnant, do not drink alcohol. If you are breastfeeding, be very cautious about drinking alcohol. If you are not pregnant and choose to drink alcohol, do not have more than 1 drink per day. One drink is considered to be 12 ounces (355 mL) of beer, 5 ounces (148 mL) of wine, or 1.5 ounces (44 mL) of liquor.  Avoid use of street drugs. Do not share needles with anyone. Ask for help if you need support or  instructions about stopping the use of drugs.  High blood pressure causes heart disease and increases the risk of stroke. Your blood pressure should be checked at least every 1 to 2 years. Ongoing high blood pressure should be treated with medicines if weight loss and exercise do not work.  If you are 34-52 years old, ask your health care provider if you should take aspirin to prevent strokes.  Diabetes screening is done by taking a blood sample to check your blood glucose level after you have not eaten for a certain period of time (fasting). If you are not overweight and you do not have risk factors for diabetes, you should be screened once every 3 years starting at age 44. If you are overweight or obese and you are 72-66 years of age, you should be screened for diabetes every year as part of your cardiovascular risk assessment.  Breast cancer screening is essential preventive care for women. You should practice "breast self-awareness." This means understanding the normal appearance and feel of your breasts and may include breast self-examination. Any changes detected, no matter how small, should be reported to a health care provider. Women in their 28s and 30s should have a clinical breast exam (CBE) by a health care provider as part of a regular health exam every 1 to 3 years. After age 23, women should have a CBE every year. Starting at age 105, women should consider having a mammogram (breast X-ray test) every year. Women who have a family history of breast cancer should talk to their health care provider about genetic screening. Women at a high risk of breast cancer should talk to their health care providers about having an MRI and a mammogram every year.  Breast cancer gene (BRCA)-related cancer risk assessment is recommended for women who have family members with BRCA-related cancers. BRCA-related cancers include breast, ovarian, tubal, and peritoneal cancers. Having family members with these  cancers may be associated with an increased risk for harmful changes (mutations) in the breast cancer genes BRCA1 and BRCA2. Results of the assessment will determine the need for genetic counseling and BRCA1 and BRCA2 testing.  Your health care provider may recommend that you be screened regularly for cancer of the pelvic organs (ovaries, uterus, and vagina). This screening involves a pelvic examination, including checking for microscopic changes to the surface of your cervix (Pap test). You may be encouraged to have this screening done every 3 years, beginning at age 60.  For women ages 79-65, health care providers may recommend pelvic exams and Pap testing every 3 years, or they may recommend the Pap and pelvic exam, combined with testing for human papilloma virus (HPV), every 5 years. Some types of HPV increase your risk of cervical cancer. Testing for HPV may also be done on women of any age with unclear Pap test results.  Other health care providers may not recommend any screening for nonpregnant women who are considered low risk for pelvic cancer and who do  not have symptoms. Ask your health care provider if a screening pelvic exam is right for you.  If you have had past treatment for cervical cancer or a condition that could lead to cancer, you need Pap tests and screening for cancer for at least 20 years after your treatment. If Pap tests have been discontinued, your risk factors (such as having a new sexual partner) need to be reassessed to determine if screening should resume. Some women have medical problems that increase the chance of getting cervical cancer. In these cases, your health care provider may recommend more frequent screening and Pap tests.  Colorectal cancer can be detected and often prevented. Most routine colorectal cancer screening begins at the age of 38 years and continues through age 71 years. However, your health care provider may recommend screening at an earlier age if you  have risk factors for colon cancer. On a yearly basis, your health care provider may provide home test kits to check for hidden blood in the stool. Use of a small camera at the end of a tube, to directly examine the colon (sigmoidoscopy or colonoscopy), can detect the earliest forms of colorectal cancer. Talk to your health care provider about this at age 39, when routine screening begins. Direct exam of the colon should be repeated every 5-10 years through age 61 years, unless early forms of precancerous polyps or small growths are found.  People who are at an increased risk for hepatitis B should be screened for this virus. You are considered at high risk for hepatitis B if:  You were born in a country where hepatitis B occurs often. Talk with your health care provider about which countries are considered high risk.  Your parents were born in a high-risk country and you have not received a shot to protect against hepatitis B (hepatitis B vaccine).  You have HIV or AIDS.  You use needles to inject street drugs.  You live with, or have sex with, someone who has hepatitis B.  You get hemodialysis treatment.  You take certain medicines for conditions like cancer, organ transplantation, and autoimmune conditions.  Hepatitis C blood testing is recommended for all people born from [birth year] through 1965 and any individual with known risks for hepatitis C.  Practice safe sex. Use condoms and avoid high-risk sexual practices to reduce the spread of sexually transmitted infections (STIs). STIs include gonorrhea, chlamydia, syphilis, trichomonas, herpes, HPV, and human immunodeficiency virus (HIV). Herpes, HIV, and HPV are viral illnesses that have no cure. They can result in disability, cancer, and death.  You should be screened for sexually transmitted illnesses (STIs) including gonorrhea and chlamydia if:  You are sexually active and are younger than 24 years.  You are older than 24 years and your  health care provider tells you that you are at risk for this type of infection.  Your sexual activity has changed since you were last screened and you are at an increased risk for chlamydia or gonorrhea. Ask your health care provider if you are at risk.  If you are at risk of being infected with HIV, it is recommended that you take a prescription medicine daily to prevent HIV infection. This is called preexposure prophylaxis (PrEP). You are considered at risk if:  You are sexually active and do not regularly use condoms or know the HIV status of your partner(s).  You take drugs by injection.  You are sexually active with a partner who has HIV.  Talk with your  health care provider about whether you are at high risk of being infected with HIV. If you choose to begin PrEP, you should first be tested for HIV. You should then be tested every 3 months for as long as you are taking PrEP.  Osteoporosis is a disease in which the bones lose minerals and strength with aging. This can result in serious bone fractures or breaks. The risk of osteoporosis can be identified using a bone density scan. Women ages 31 years and over and women at risk for fractures or osteoporosis should discuss screening with their health care providers. Ask your health care provider whether you should take a calcium supplement or vitamin D to reduce the rate of osteoporosis.  Menopause can be associated with physical symptoms and risks. Hormone replacement therapy is available to decrease symptoms and risks. You should talk to your health care provider about whether hormone replacement therapy is right for you.  Use sunscreen. Apply sunscreen liberally and repeatedly throughout the day. You should seek shade when your shadow is shorter than you. Protect yourself by wearing long sleeves, pants, a wide-brimmed hat, and sunglasses year round, whenever you are outdoors.  Once a month, do a whole body skin exam, using a mirror to look  at the skin on your back. Tell your health care provider of new moles, moles that have irregular borders, moles that are larger than a pencil eraser, or moles that have changed in shape or color.  Stay current with required vaccines (immunizations).  Influenza vaccine. All adults should be immunized every year.  Tetanus, diphtheria, and acellular pertussis (Td, Tdap) vaccine. Pregnant women should receive 1 dose of Tdap vaccine during each pregnancy. The dose should be obtained regardless of the length of time since the last dose. Immunization is preferred during the 27th-36th week of gestation. An adult who has not previously received Tdap or who does not know her vaccine status should receive 1 dose of Tdap. This initial dose should be followed by tetanus and diphtheria toxoids (Td) booster doses every 10 years. Adults with an unknown or incomplete history of completing a 3-dose immunization series with Td-containing vaccines should begin or complete a primary immunization series including a Tdap dose. Adults should receive a Td booster every 10 years.  Varicella vaccine. An adult without evidence of immunity to varicella should receive 2 doses or a second dose if she has previously received 1 dose. Pregnant females who do not have evidence of immunity should receive the first dose after pregnancy. This first dose should be obtained before leaving the health care facility. The second dose should be obtained 4-8 weeks after the first dose.  Human papillomavirus (HPV) vaccine. Females aged 13-26 years who have not received the vaccine previously should obtain the 3-dose series. The vaccine is not recommended for use in pregnant females. However, pregnancy testing is not needed before receiving a dose. If a female is found to be pregnant after receiving a dose, no treatment is needed. In that case, the remaining doses should be delayed until after the pregnancy. Immunization is recommended for any person  with an immunocompromised condition through the age of 77 years if she did not get any or all doses earlier. During the 3-dose series, the second dose should be obtained 4-8 weeks after the first dose. The third dose should be obtained 24 weeks after the first dose and 16 weeks after the second dose.  Zoster vaccine. One dose is recommended for adults aged 47 years  or older unless certain conditions are present.  Measles, mumps, and rubella (MMR) vaccine. Adults born before [birth year] generally are considered immune to measles and mumps. Adults born in [birth year] or later should have 1 or more doses of MMR vaccine unless there is a contraindication to the vaccine or there is laboratory evidence of immunity to each of the three diseases. A routine second dose of MMR vaccine should be obtained at least 28 days after the first dose for students attending postsecondary schools, health care workers, or international travelers. People who received inactivated measles vaccine or an unknown type of measles vaccine during 1963-1967 should receive 2 doses of MMR vaccine. People who received inactivated mumps vaccine or an unknown type of mumps vaccine before 1979 and are at high risk for mumps infection should consider immunization with 2 doses of MMR vaccine. For females of childbearing age, rubella immunity should be determined. If there is no evidence of immunity, females who are not pregnant should be vaccinated. If there is no evidence of immunity, females who are pregnant should delay immunization until after pregnancy. Unvaccinated health care workers born before [birth year] who lack laboratory evidence of measles, mumps, or rubella immunity or laboratory confirmation of disease should consider measles and mumps immunization with 2 doses of MMR vaccine or rubella immunization with 1 dose of MMR vaccine.  Pneumococcal 13-valent conjugate (PCV13) vaccine. When indicated, a person who is uncertain of his immunization history and has  no record of immunization should receive the PCV13 vaccine. All adults 65 years of age and older should receive this vaccine. An adult aged 64 years or older who has certain medical conditions and has not been previously immunized should receive 1 dose of PCV13 vaccine. This PCV13 should be followed with a dose of pneumococcal polysaccharide (PPSV23) vaccine. Adults who are at high risk for pneumococcal disease should obtain the PPSV23 vaccine at least 8 weeks after the dose of PCV13 vaccine. Adults older than 34 years of age who have normal immune system function should obtain the PPSV23 vaccine dose at least 1 year after the dose of PCV13 vaccine.  Pneumococcal polysaccharide (PPSV23) vaccine. When PCV13 is also indicated, PCV13 should be obtained first. All adults aged 54 years and older should be immunized. An adult younger than age 40 years who has certain medical conditions should be immunized. Any person who resides in a nursing home or long-term care facility should be immunized. An adult smoker should be immunized. People with an immunocompromised condition and certain other conditions should receive both PCV13 and PPSV23 vaccines. People with human immunodeficiency virus (HIV) infection should be immunized as soon as possible after diagnosis. Immunization during chemotherapy or radiation therapy should be avoided. Routine use of PPSV23 vaccine is not recommended for American Indians, [place] Natives, or people younger than 65 years unless there are medical conditions that require PPSV23 vaccine. When indicated, people who have unknown immunization and have no record of immunization should receive PPSV23 vaccine. One-time revaccination 5 years after the first dose of PPSV23 is recommended for people aged 19-64 years who have chronic kidney failure, nephrotic syndrome, asplenia, or immunocompromised conditions. People who received 1-2 doses of PPSV23 before age 81 years should receive another dose of PPSV23  vaccine at age 76 years or later if at least 5 years have passed since the previous dose. Doses of PPSV23 are not needed for people immunized with PPSV23 at or after age 16 years.  Meningococcal vaccine. Adults with asplenia or persistent complement component  deficiencies should receive 2 doses of quadrivalent meningococcal conjugate (MenACWY-D) vaccine. The doses should be obtained at least 2 months apart. Microbiologists working with certain meningococcal bacteria, [employer] recruits, people at risk during an outbreak, and people who travel to or live in countries with a high rate of meningitis should be immunized. A first-year college student up through age 9 years who is living in a residence hall should receive a dose if she did not receive a dose on or after her 16th birthday. Adults who have certain high-risk conditions should receive one or more doses of vaccine.  Hepatitis A vaccine. Adults who wish to be protected from this disease, have certain high-risk conditions, work with hepatitis A-infected animals, work in hepatitis A research labs, or travel to or work in countries with a high rate of hepatitis A should be immunized. Adults who were previously unvaccinated and who anticipate close contact with an international adoptee during the first 60 days after arrival in the [place] from a country with a high rate of hepatitis A should be immunized.  Hepatitis B vaccine. Adults who wish to be protected from this disease, have certain high-risk conditions, may be exposed to blood or other infectious body fluids, are household contacts or sex partners of hepatitis B positive people, are clients or workers in certain care facilities, or travel to or work in countries with a high rate of hepatitis B should be immunized.  Haemophilus influenzae type b (Hib) vaccine. A previously unvaccinated person with asplenia or sickle cell disease or having a scheduled splenectomy should receive 1 dose of Hib  vaccine. Regardless of previous immunization, a recipient of a hematopoietic stem cell transplant should receive a 3-dose series 6-12 months after her successful transplant. Hib vaccine is not recommended for adults with HIV infection. Preventive Services / Frequency Ages 35 to 20 years  Blood pressure check.** / Every 3-5 years.  Lipid and cholesterol check.** / Every 5 years beginning at age 37.  Clinical breast exam.** / Every 3 years for women in their 62s and 7s.  BRCA-related cancer risk assessment.** / For women who have family members with a BRCA-related cancer (breast, ovarian, tubal, or peritoneal cancers).  Pap test.** / Every 2 years from ages 14 through 19. Every 3 years starting at age 34 through age 68 or 54 with a history of 3 consecutive normal Pap tests.  HPV screening.** / Every 3 years from ages 20 through ages 34 to 6 with a history of 3 consecutive normal Pap tests.  Hepatitis C blood test.** / For any individual with known risks for hepatitis C.  Skin self-exam. / Monthly.  Influenza vaccine. / Every year.  Tetanus, diphtheria, and acellular pertussis (Tdap, Td) vaccine.** / Consult your health care provider. Pregnant women should receive 1 dose of Tdap vaccine during each pregnancy. 1 dose of Td every 10 years.  Varicella vaccine.** / Consult your health care provider. Pregnant females who do not have evidence of immunity should receive the first dose after pregnancy.  HPV vaccine. / 3 doses over 6 months, if 63 and younger. The vaccine is not recommended for use in pregnant females. However, pregnancy testing is not needed before receiving a dose.  Measles, mumps, rubella (MMR) vaccine.** / You need at least 1 dose of MMR if you were born in 1957 or later. You may also need a 2nd dose. For females of childbearing age, rubella immunity should be determined. If there is no evidence of immunity, females who  are not pregnant should be vaccinated. If there is no  evidence of immunity, females who are pregnant should delay immunization until after pregnancy.  Pneumococcal 13-valent conjugate (PCV13) vaccine.** / Consult your health care provider.  Pneumococcal polysaccharide (PPSV23) vaccine.** / 1 to 2 doses if you smoke cigarettes or if you have certain conditions.  Meningococcal vaccine.** / 1 dose if you are age 67 to 28 years and a Market researcher living in a residence hall, or have one of several medical conditions, you need to get vaccinated against meningococcal disease. You may also need additional booster doses.  Hepatitis A vaccine.** / Consult your health care provider.  Hepatitis B vaccine.** / Consult your health care provider.  Haemophilus influenzae type b (Hib) vaccine.** / Consult your health care provider. Ages 46 to 42 years  Blood pressure check.** / Every year.  Lipid and cholesterol check.** / Every 5 years beginning at age 85 years.  Lung cancer screening. / Every year if you are aged 91-80 years and have a 30-pack-year history of smoking and currently smoke or have quit within the past 15 years. Yearly screening is stopped once you have quit smoking for at least 15 years or develop a health problem that would prevent you from having lung cancer treatment.  Clinical breast exam.** / Every year after age 13 years.  BRCA-related cancer risk assessment.** / For women who have family members with a BRCA-related cancer (breast, ovarian, tubal, or peritoneal cancers).  Mammogram.** / Every year beginning at age 18 years and continuing for as long as you are in good health. Consult with your health care provider.  Pap test.** / Every 3 years starting at age 52 years through age 16 or 41 years with a history of 3 consecutive normal Pap tests.  HPV screening.** / Every 3 years from ages 68 years through ages 49 to 62 years with a history of 3 consecutive normal Pap tests.  Fecal occult blood test (FOBT) of stool. /  Every year beginning at age 77 years and continuing until age 18 years. You may not need to do this test if you get a colonoscopy every 10 years.  Flexible sigmoidoscopy or colonoscopy.** / Every 5 years for a flexible sigmoidoscopy or every 10 years for a colonoscopy beginning at age 69 years and continuing until age 31 years.  Hepatitis C blood test.** / For all people born from [birth year] through 1964 and any individual with known risks for hepatitis C.  Skin self-exam. / Monthly.  Influenza vaccine. / Every year.  Tetanus, diphtheria, and acellular pertussis (Tdap/Td) vaccine.** / Consult your health care provider. Pregnant women should receive 1 dose of Tdap vaccine during each pregnancy. 1 dose of Td every 10 years.  Varicella vaccine.** / Consult your health care provider. Pregnant females who do not have evidence of immunity should receive the first dose after pregnancy.  Zoster vaccine.** / 1 dose for adults aged 95 years or older.  Measles, mumps, rubella (MMR) vaccine.** / You need at least 1 dose of MMR if you were born in 1957 or later. You may also need a second dose. For females of childbearing age, rubella immunity should be determined. If there is no evidence of immunity, females who are not pregnant should be vaccinated. If there is no evidence of immunity, females who are pregnant should delay immunization until after pregnancy.  Pneumococcal 13-valent conjugate (PCV13) vaccine.** / Consult your health care provider.  Pneumococcal polysaccharide (PPSV23) vaccine.** / 1 to  2 doses if you smoke cigarettes or if you have certain conditions.  Meningococcal vaccine.** / Consult your health care provider.  Hepatitis A vaccine.** / Consult your health care provider.  Hepatitis B vaccine.** / Consult your health care provider.  Haemophilus influenzae type b (Hib) vaccine.** / Consult your health care provider. Ages 18 years and over  Blood pressure check.** / Every year.  Lipid  and cholesterol check.** / Every 5 years beginning at age 24 years.  Lung cancer screening. / Every year if you are aged 65-80 years and have a 30-pack-year history of smoking and currently smoke or have quit within the past 15 years. Yearly screening is stopped once you have quit smoking for at least 15 years or develop a health problem that would prevent you from having lung cancer treatment.  Clinical breast exam.** / Every year after age 62 years.  BRCA-related cancer risk assessment.** / For women who have family members with a BRCA-related cancer (breast, ovarian, tubal, or peritoneal cancers).  Mammogram.** / Every year beginning at age 27 years and continuing for as long as you are in good health. Consult with your health care provider.  Pap test.** / Every 3 years starting at age 1 years through age 80 or 45 years with 3 consecutive normal Pap tests. Testing can be stopped between 65 and 70 years with 3 consecutive normal Pap tests and no abnormal Pap or HPV tests in the past 10 years.  HPV screening.** / Every 3 years from ages 32 years through ages 81 or 18 years with a history of 3 consecutive normal Pap tests. Testing can be stopped between 65 and 70 years with 3 consecutive normal Pap tests and no abnormal Pap or HPV tests in the past 10 years.  Fecal occult blood test (FOBT) of stool. / Every year beginning at age 33 years and continuing until age 57 years. You may not need to do this test if you get a colonoscopy every 10 years.  Flexible sigmoidoscopy or colonoscopy.** / Every 5 years for a flexible sigmoidoscopy or every 10 years for a colonoscopy beginning at age 85 years and continuing until age 28 years.  Hepatitis C blood test.** / For all people born from [birth year] through 1965 and any individual with known risks for hepatitis C.  Osteoporosis screening.** / A one-time screening for women ages 83 years and over and women at risk for fractures or osteoporosis.  Skin  self-exam. / Monthly.  Influenza vaccine. / Every year.  Tetanus, diphtheria, and acellular pertussis (Tdap/Td) vaccine.** / 1 dose of Td every 10 years.  Varicella vaccine.** / Consult your health care provider.  Zoster vaccine.** / 1 dose for adults aged 29 years or older.  Pneumococcal 13-valent conjugate (PCV13) vaccine.** / Consult your health care provider.  Pneumococcal polysaccharide (PPSV23) vaccine.** / 1 dose for all adults aged 36 years and older.  Meningococcal vaccine.** / Consult your health care provider.  Hepatitis A vaccine.** / Consult your health care provider.  Hepatitis B vaccine.** / Consult your health care provider.  Haemophilus influenzae type b (Hib) vaccine.** / Consult your health care provider. ** Family history and personal history of risk and conditions may change your health care provider's recommendations.   This information is not intended to replace advice given to you by your health care provider. Make sure you discuss any questions you have with your health care provider.   Document Released: 08/14/2001 Document Revised: 07/09/2014 Document Reviewed: 11/13/2010 Elsevier Interactive Patient  Education 2016 [employer].

## 2015-11-12 LAB — URINE CULTURE

## 2015-11-17 ENCOUNTER — Encounter: Payer: Self-pay | Admitting: Family Medicine

## 2015-11-17 ENCOUNTER — Ambulatory Visit: Payer: Managed Care, Other (non HMO) | Admitting: Family Medicine

## 2015-11-17 ENCOUNTER — Ambulatory Visit (INDEPENDENT_AMBULATORY_CARE_PROVIDER_SITE_OTHER): Payer: Managed Care, Other (non HMO) | Admitting: Family Medicine

## 2015-11-17 VITALS — BP 103/66 | HR 73 | Ht 59.0 in | Wt 112.0 lb

## 2015-11-17 DIAGNOSIS — M25552 Pain in left hip: Secondary | ICD-10-CM

## 2015-11-17 NOTE — Patient Instructions (Addendum)
You have IT band syndrome, trochanteric bursitis, and hip external rotator strain. Your ultrasound confirms the bursitis - there are no muscle tears of external rotators, quad, hip flexors, IT band. Avoid painful activities as much as possible. I would try to do Zumba classes at half (20 minutes, increase by 5 minutes every other day if you're not having increased pain). Ice over area of pain 3-4 times a day for 15 minutes at a time Standing hip rotations, hip side raise exercise 3 sets of 10-15 once a day - add weights if this becomes too easy. Stretches - pick 2 and hold for 20-30 seconds x 3 - do once or twice a day. Tylenol and/or aleve as needed for pain. Ok to use the salon pas patches. Consider physical therapy, cortisone injection, MR arthrogram of the hip if not improving. Follow up in 6 weeks.

## 2015-11-21 NOTE — Progress Notes (Signed)
PCP and consultation requested by: Penni Homans, MD  Subjective:   HPI: Patient is a 35 y.o. female here for left hip pain.  Patient recalls remote injury back in 2011 when she stepped in a hole and twisted her leg. 2 days later had lateral left hip pain. Since that time pain has been intermittent though current pain past 3 months is more consistent. Worse with prolonged sitting. Pain 5/10, sharp. Feels lateral and posterior mainly No numbness or tingling. No radiation.  Past Medical History  Diagnosis Date  . IBS (irritable bowel syndrome) 05/31/2011    sees Alonza Bogus in GI - hx rectal bleeding  . GERD (gastroesophageal reflux disease) 05/31/2011    and nausea, sees Alonza Bogus in [place]  . Optic neuritis 07/12/2011    sees opthomologist  . Allergic rhinitis 07/13/2011  . Migraines 07/13/2011  . Bell's palsy 07/13/2011    Hx of, used to see Dr. Jacelyn Grip  . Anxiety attack 11/13/2011  . Chest pain, atypical 11/29/2011  . Folliculitis [id number]  . 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 Urticaria 01/11/2012  . Other and unspecified ovarian cysts   . 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 Gonorrhea 02/08/2013  . Abnormal thyroid function test 03/15/2013    once, all normal since  . History of Meniere's disease 09/14/2011  . Fatty infiltration of liver 07/17/2015  . Preventative health care 11/11/2015  . UTI (urinary tract infection) 11/11/2015  . Vaginitis and vulvovaginitis 11/11/2015  . Fatigue 11/11/2015  . Left hip pain 11/11/2015    Has a history of left hip s/p an injury 6 years ago. Wearing heals twisted ankle in a whole and the next day her hip started hurting and has bothered her off and on since then.     Current Outpatient Prescriptions on File Prior to Visit  Medication Sig Dispense Refill  . acyclovir (ZOVIRAX) 800 MG tablet Take 1 tablet (800 mg total) by mouth 3 (three) times daily. 21 tablet 1  . aspirin-acetaminophen-caffeine (EXCEDRIN MIGRAINE) [id number] MG per tablet Take 1 tablet by  mouth every 6 (six) hours as needed. For migraine    . diphenhydrAMINE (BENADRYL) 25 MG tablet Take 25 mg by mouth at bedtime as needed. For allergies    . fluticasone (FLONASE) 50 MCG/ACT nasal spray Place 2 sprays into both nostrils daily.  0  . hyoscyamine (LEVSIN SL) 0.125 MG SL tablet PLACE 1-2 TABLETS UNDER TONGUE EVERY 4 HOURS DAILY AS NEEDED FOR PAIN 30 tablet 6  . montelukast (SINGULAIR) 10 MG tablet TAKE 1 TABLET BY MOUTH EVERY DAY AS NEEDED 90 tablet 2  . ondansetron (ZOFRAN ODT) 4 MG disintegrating tablet 4mg  ODT q4 hours prn nausea/vomit 4 tablet 0  . OXcarbazepine ER 300 MG TB24 Take 300 mg by mouth daily. 90 tablet 3  . pantoprazole (PROTONIX) 20 MG tablet Take 20 mg by mouth daily.    . predniSONE (DELTASONE) 20 MG tablet Take 1 tablet by mouth as needed.     No current facility-administered medications on file prior to visit.    Past Surgical History  Procedure Laterality Date  . Rhinoplasty      at 35 yo  . Cheek bones corrected      at 35 yo  . Breast enhancement surgery  2008  . Appendectomy  2006    Enlarged  . Upper gastrointestinal endoscopy      Allergies  Allergen Reactions  . Azithromycin     gastritis  . Iodinated Diagnostic Agents Hives  Pt developed 2 hives around site of injection Lt antecubital that were itchy after leaving lobby post scan. Given 50 mg oral benadryl.     Social History   Social History  . Marital Status: Married    Spouse Name: N/A  . Number of Children: 1  . Years of Education: N/A   Occupational History  . C[employer]    Social History Main Topics  . Smoking status: Never Smoker   . Smokeless tobacco: Never Used  . Alcohol Use: 0.0 oz/week    0 Standard drinks or equivalent per week     Comment: ocassional-2-3 weekly  . Drug Use: No  . Sexual Activity:    Partners: Male   Other Topics Concern  . 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 Heart disease Mother   . Hyperlipidemia Mother   . Rheum arthritis Mother   . Fibroids Mother     Uterine  . Fibrocystic breast disease Mother   . Other Mother     gastritis  . Hypertension Mother   . Hyperlipidemia Maternal Grandmother   . Osteoarthritis Maternal Grandmother   . Glaucoma Maternal Grandmother   . Kidney disease Maternal Grandmother   . Cancer Maternal Grandfather     Skin, prostate, throat, stomach  . Colon cancer Maternal Grandfather   . Breast cancer Maternal Aunt   . Other Father     gastritis  . Heart disease Father     arteriosclerosis  . Arthritis Father     femoral head necrosis b/l  . Dementia Paternal Grandmother   . Heart disease Paternal Uncle     BP 103/66 mmHg  Pulse 73  Ht 4\' 11"  (1.499 m)  Wt 112 lb (50.803 kg)  BMI 22.61 kg/m2  Review of Systems: See HPI above.    Objective:  Physical Exam:  Gen: NAD, comfortable in exam room  Back/left hip: No gross deformity, scoliosis. TTP greater trochanter, proximal IT band, inferior to piriformis posteriorly.  No other hip, back tenderness.  No midline or bony TTP. FROM. Strength LEs 5/5 all muscle groups.   Negative SLRs. Sensation intact to light touch bilaterally. Negative logroll bilateral hips Negative fabers and piriformis stretches.  MSK u/s left hip:  Proximal femur, hip joint appear normal - no cortical irregularities, edema overlying cortex, or neovascularity.  Normal quadriceps muscles, iliopsoas.  Increased fluid in trochanteric bursa.  IT band, piriformis, external rotators, ischial tuberosity all with normal appearance.    Assessment & Plan:  1. Left hip pain - History, exam, and ultrasound consistent with trochanteric bursitis,  proximal IT band syndrome.  No abnormalities of external hip rotators but has some tenderness here, likely secondary strain.  Icing, home exercises and stretches reviewed.  Tylenol or aleve as needed.  Salon pas patches.  Consider physical therapy, trochanteric bursa injection if not improving.  F/u in 6 weeks.

## 2015-11-21 NOTE — Assessment & Plan Note (Signed)
History, exam, and ultrasound consistent with trochanteric bursitis, proximal IT band syndrome.  No abnormalities of external hip rotators but has some tenderness here, likely secondary strain.  Icing, home exercises and stretches reviewed.  Tylenol or aleve as needed.  Salon pas patches.  Consider physical therapy, trochanteric bursa injection if not improving.  F/u in 6 weeks.

## 2015-12-11 ENCOUNTER — Other Ambulatory Visit: Payer: Self-pay | Admitting: Internal Medicine

## 2015-12-28 ENCOUNTER — Ambulatory Visit: Payer: Managed Care, Other (non HMO) | Admitting: Family Medicine

## 2016-04-05 ENCOUNTER — Telehealth: Payer: Self-pay | Admitting: Family Medicine

## 2016-04-05 NOTE — Telephone Encounter (Signed)
I scheduled a Hospital Follow Up with Danielle Sims (Dr. Frederik Pear) patient on 04/12/16 at 1:30pm in a Physical spot. Patients PCP was unavailable.

## 2016-04-06 NOTE — Telephone Encounter (Signed)
To PCP to advise     KP

## 2016-04-12 ENCOUNTER — Ambulatory Visit (INDEPENDENT_AMBULATORY_CARE_PROVIDER_SITE_OTHER): Payer: Managed Care, Other (non HMO) | Admitting: Medical

## 2016-04-12 ENCOUNTER — Encounter: Payer: Self-pay | Admitting: Medical

## 2016-04-12 VITALS — BP 115/71 | HR 62 | Temp 98.6°F | Ht 59.0 in | Wt 115.6 lb

## 2016-04-12 DIAGNOSIS — R0789 Other chest pain: Secondary | ICD-10-CM

## 2016-04-12 DIAGNOSIS — R5383 Other fatigue: Secondary | ICD-10-CM

## 2016-04-12 DIAGNOSIS — E785 Hyperlipidemia, unspecified: Secondary | ICD-10-CM

## 2016-04-12 LAB — TROPONIN I: TNIDX: 0 ug/l (ref 0.00–0.06)

## 2016-04-12 LAB — COMPREHENSIVE METABOLIC PANEL
ALT: 19 U/L (ref 0–35)
AST: 17 U/L (ref 0–37)
Albumin: 4 g/dL (ref 3.5–5.2)
Alkaline Phosphatase: 61 U/L (ref 39–117)
BILIRUBIN TOTAL: 0.4 mg/dL (ref 0.2–1.2)
BUN: 15 mg/dL (ref 6–23)
CO2: 30 meq/L (ref 19–32)
CREATININE: 0.69 mg/dL (ref 0.40–1.20)
Calcium: 9.5 mg/dL (ref 8.4–10.5)
Chloride: 103 mEq/L (ref 96–112)
GFR: 103.07 mL/min (ref >60.00)
GLUCOSE: 94 mg/dL (ref 70–99)
Potassium: 3.9 mEq/L (ref 3.5–5.1)
Sodium: 138 mEq/L (ref 135–145)
Total Protein: 7.5 g/dL (ref 6.0–8.3)

## 2016-04-12 LAB — CBC WITH DIFFERENTIAL/PLATELET
BASOS ABS: 0 10*3/uL (ref 0.0–0.1)
Basophils Relative: 0.4 % (ref 0.0–3.0)
EOS ABS: 0.1 10*3/uL (ref 0.0–0.7)
Eosinophils Relative: 1.8 % (ref 0.0–5.0)
HEMATOCRIT: 38.7 % (ref 36.0–46.0)
Hemoglobin: 13.1 g/dL (ref 12.0–15.0)
LYMPHS ABS: 2.5 10*3/uL (ref 0.7–4.0)
LYMPHS PCT: 32.6 % (ref 12.0–46.0)
MCHC: 34 g/dL (ref 30.0–36.0)
MCV: 87.1 fl (ref 78.0–100.0)
Monocytes Absolute: 0.5 10*3/uL (ref 0.1–1.0)
Monocytes Relative: 6.2 % (ref 3.0–12.0)
NEUTROS PCT: 59 % (ref 43.0–77.0)
Neutro Abs: 4.6 10*3/uL (ref 1.4–7.7)
PLATELETS: 380 10*3/uL (ref 150.0–400.0)
RBC: 4.44 Mil/uL (ref 3.87–5.11)
RDW: 13.9 % (ref 11.5–15.5)
WBC: 7.8 10*3/uL (ref 4.0–10.5)

## 2016-04-12 LAB — VITAMIN B12: Vitamin B-12: 173 pg/mL — ABNORMAL LOW (ref 211–911)

## 2016-04-12 LAB — TSH: TSH: 1.36 u[IU]/mL (ref 0.35–4.50)

## 2016-04-12 NOTE — Patient Instructions (Addendum)
Your ekg appears normal. Since you did have some pain today will get stat troponin.  For your fatigue history and low b12 will get labs.   I am going to go ahead and refer you to cardiologist for likely stress test. I asked referral staff to try to schedule you by early next week. Anderson Malta and Steffanie Dunn are our staff member to contact in event no one call you by Tuesday am.  In event of recurrent chest pain would recommend evaluation in ED again. We have ED downstairs in our building as well.  Follow up in 7-10 days here or as needed.(on follow up here recommend fasting so we can repeat lipid panel)  While cardiologist appointment pending you could try low dose ibuprofen to see if makes impact on your level of pain. This may help differentiate if maybe muscle or cartilage component.

## 2016-04-12 NOTE — Progress Notes (Signed)
Subjective:    Patient ID: Danielle Sims, female    DOB: 1981-01-19, 35 y.o.   MRN: [id number]  HPI  Pt in for evaluation.  Pt  had some lt  arm pain with some mild chest pain/pressure about one week ago.    Pt was taken to  [facility]. Pt had d-dimer, cardiac ezymes and cmp.   D-dimer was 0.57. Troponin was normal. In ED. Na was mild low at 136. Wbc was normal.   Pt had nuclear medicine perfusion scan and was negative exam. No findings to suggest PE. CXR normal.  [place] abd done as well.  Pt state ekg done and normal per pt but could not find in care everywhere.    Pt had 3 uncles who had MI in their 38's. One uncle passed away. Pt mom had TIA dx from on MRI. Pt dad did not have MI. Pt dad some atherosclerosis per his cardiologist.  Pt since the ED evaluation has had daily chest pain about 2-3 times a day. Both at rest and with activity. Can last seconds to 5 minutes. Pt at times will have general arm pain and dull  chest pain that last for minutes. But also sharp pain that last for seconds.   Pt last had pain this morning around 10:30. Last for 2 minutes  then subsided. Has not returned.  LMP- 1 week ago.   Review of Systems  Constitutional: Positive for fatigue.  HENT: Negative for congestion and sinus pressure.   Respiratory: Negative for cough, choking, shortness of breath and wheezing.   Cardiovascular: Negative for chest pain and palpitations.       None since 10:30 am.  Pt later mentioned she had 2 palpitation during these events.   Gastrointestinal: Negative for abdominal pain, blood in stool, constipation, diarrhea, nausea and vomiting.       Hx of gerd but controlled recently.  Musculoskeletal: Negative for back pain, myalgias, neck pain and neck stiffness.       Siome left arm pain associated with chest pain.  Neurological: Negative for dizziness and headaches.       Light headed at times.  Hematological: Negative for adenopathy. Does not bruise/bleed  easily.  Psychiatric/Behavioral: Negative for behavioral problems and confusion.    Past Medical History:  Diagnosis Date  . Abnormal thyroid function test 03/15/2013   once, all normal since  . Allergic rhinitis 07/13/2011  . Anxiety attack 11/13/2011  . Bell's palsy 07/13/2011   Hx of, used to see Dr. Jacelyn Grip  . Chest pain, atypical 11/29/2011  . Fatigue 11/11/2015  . Fatty infiltration of liver 07/17/2015  . Folliculitis [id number]  . GERD (gastroesophageal reflux disease) 05/31/2011   and nausea, sees Alonza Bogus in [place]  . Gonorrhea 02/08/2013  . History of Meniere's disease 09/14/2011  . 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 IBS (irritable bowel syndrome) 05/31/2011   sees Alonza Bogus in GI - hx rectal bleeding  . Left hip pain 11/11/2015   Has a history of left hip s/p an injury 6 years ago. Wearing heals twisted ankle in a whole and the next day her hip started hurting and has bothered her off and on since then.   . Migraines 07/13/2011  . Optic neuritis 07/12/2011   sees opthomologist  . Other and unspecified ovarian cysts   . 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 Preventative health care 11/11/2015  . Urticaria 01/11/2012  . UTI (urinary tract infection) 11/11/2015  . Vaginitis and vulvovaginitis 11/11/2015  Social History   Social History  . Marital status: Married    Spouse name: N/A  . Number of children: 1  . Years of education: N/A   Occupational History  . [employer]   Social History Main Topics  . Smoking status: Never Smoker  . Smokeless tobacco: Never Used  . Alcohol use 0.0 oz/week     Comment: ocassional-2-3 weekly  . Drug use: No  . Sexual activity: Yes    Partners: Male   Other Topics Concern  . 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 APPENDECTOMY  2006   Enlarged  . BREAST ENHANCEMENT SURGERY  2008  . Cheek bones corrected     at 35 yo  . RHINOPLASTY     at 35 yo  . UPPER GASTROINTESTINAL ENDOSCOPY      Family History  Problem Relation Age of Onset  . Heart disease Mother   . Hyperlipidemia Mother   . Rheum arthritis Mother   . Fibroids Mother     Uterine  . Fibrocystic breast disease Mother   . Other Mother     gastritis  . Hypertension Mother   . Hyperlipidemia Maternal Grandmother   . Osteoarthritis Maternal Grandmother   . Glaucoma Maternal Grandmother   . Kidney disease Maternal Grandmother   . Cancer Maternal Grandfather     Skin, prostate, throat, stomach  . Colon cancer Maternal Grandfather   . Breast cancer Maternal Aunt   . Other Father     gastritis  . Heart disease Father     arteriosclerosis  . Arthritis Father     femoral head necrosis b/l  . Dementia Paternal Grandmother   . Heart disease Paternal Uncle     Allergies  Allergen Reactions  . Azithromycin     gastritis  . Iodinated Diagnostic Agents Hives    Pt developed 2 hives around site of injection Lt antecubital that were itchy after leaving lobby post scan. Given 50 mg oral benadryl.     Current Outpatient Prescriptions on File Prior to Visit  Medication Sig Dispense Refill  . acyclovir (ZOVIRAX) 800 MG tablet Take 1 tablet (800 mg total) by mouth 3 (three) times daily. 21 tablet 1  . aspirin-acetaminophen-caffeine (EXCEDRIN MIGRAINE) [id number] MG per tablet Take 1 tablet by mouth every 6 (six) hours as needed. For migraine    . diphenhydrAMINE (BENADRYL) 25 MG tablet Take 25 mg by mouth at bedtime as needed. For allergies    . hyoscyamine (LEVSIN SL) 0.125 MG SL tablet PLACE 1-2 TABLETS UNDER TONGUE EVERY 4 HOURS DAILY AS NEEDED FOR PAIN 30 tablet 6  . OXcarbazepine ER 300 MG TB24 Take 300 mg by mouth  daily. 90 tablet 3  . pantoprazole (PROTONIX) 40 MG tablet TAKE 1 TABLET BY MOUTH EVERY DAY 90 tablet 1  . predniSONE (DELTASONE) 20 MG tablet Take 1 tablet by mouth as needed.    . fluticasone (FLONASE) 50 MCG/ACT nasal spray Place 2 sprays into both nostrils daily.  0  . montelukast (SINGULAIR)  10 MG tablet TAKE 1 TABLET BY MOUTH EVERY DAY AS NEEDED (Patient not taking: Reported on 04/12/2016) 90 tablet 2  . ondansetron (ZOFRAN ODT) 4 MG disintegrating tablet 4mg  ODT q4 hours prn nausea/vomit (Patient not taking: Reported on 04/12/2016) 4 tablet 0  . pantoprazole (PROTONIX) 20 MG tablet Take 20 mg by mouth daily.     No current facility-administered medications on file prior to visit.     BP 115/71   Pulse 62   Temp 98.6 F (37 C) (Oral)   Ht 4\' 11"  (1.499 m)   Wt 115 lb 9.6 oz (52.4 kg)   LMP 04/05/2016   SpO2 100%   BMI 23.35 kg/m       Objective:   Physical Exam  General Mental Status- Alert. General Appearance- Not in acute distress.   Skin General: Color- Normal Color. Moisture- Normal Moisture.  Neck No jvd. No carotid bruits.  Chest and Lung Exam Auscultation: Breath Sounds:-Normal.  Cardiovascular Auscultation:Rythm- Regular. Murmurs & Other Heart Sounds:Auscultation of the heart reveals- No Murmurs.  Abdomen Inspection:-Inspeection Normal. Palpation/Percussion:Note:No mass. Palpation and Percussion of the abdomen reveal- Non Tender, Non Distended + BS, no rebound or guarding.   Neurologic Cranial Nerve exam:- CN III-XII intact(No nystagmus), symmetric smile. Strength:- 5/5 equal and symmetric strength both upper and lower extremities.  Lower ext- no pedal edema. Negative homans signs.   Anterior chest- no chest wall pain on palpation. Lt upper ext- on rom, and palpatoin of shoulder no pain.     Assessment & Plan:  Your ekg appears normal. Since you did have some pain today will get stat troponin.  For your fatigue history and low b12 will get  labs.   I am going to go ahead and refer you to cardiologist for likely stress test. I asked referral staff to try to schedule you by early next week. Anderson Malta and Steffanie Dunn are our staff member to contact in event no one call you by Tuesday am.  In event of recurrent chest pain would recommend evaluation in ED again. We have ED downstairs in our building as well.  Follow up in 7-10 days here or as needed.(on follow up here recommend fasting so we can repeat lipid panel)  Aylla Huffine, Percell Miller, PA-C

## 2016-04-12 NOTE — Progress Notes (Signed)
Pre visit review using our clinic tool,if applicable. No additional management support is needed unless otherwise documented below in the visit note.  

## 2016-04-14 ENCOUNTER — Telehealth: Payer: Self-pay | Admitting: Medical

## 2016-04-16 NOTE — Telephone Encounter (Signed)
Opened for review. 

## 2016-04-17 ENCOUNTER — Telehealth: Payer: Self-pay | Admitting: Medical

## 2016-04-17 NOTE — Telephone Encounter (Signed)
Opened to review 

## 2016-04-23 ENCOUNTER — Other Ambulatory Visit: Payer: Managed Care, Other (non HMO)

## 2016-04-24 ENCOUNTER — Encounter: Payer: Self-pay | Admitting: Cardiology

## 2016-04-25 ENCOUNTER — Ambulatory Visit (INDEPENDENT_AMBULATORY_CARE_PROVIDER_SITE_OTHER): Payer: Managed Care, Other (non HMO) | Admitting: *Deleted

## 2016-04-25 DIAGNOSIS — Z23 Encounter for immunization: Secondary | ICD-10-CM | POA: Diagnosis not present

## 2016-04-25 DIAGNOSIS — E538 Deficiency of other specified B group vitamins: Secondary | ICD-10-CM | POA: Diagnosis not present

## 2016-04-25 MED ORDER — CYANOCOBALAMIN 1000 MCG/ML IJ SOLN
1000.0000 ug | Freq: Once | INTRAMUSCULAR | Status: AC
  Administered 2016-04-25: 1000 ug via INTRAMUSCULAR

## 2016-04-25 NOTE — Progress Notes (Signed)
Pre visit review using our clinic review tool, if applicable. No additional management support is needed unless otherwise documented below in the visit note.  Pt here for B12 injection per 04/12/16 result notes. Also requested flu shot. Patient tolerated injections well.  Dorrene German, RN

## 2016-05-03 NOTE — Progress Notes (Signed)
HPI: 35 year old female for evaluation of chest pain. Patient was seen at [facility] earlier this month with chest pain. Troponin was normal. Hemoglobin and liver functions normal. VQ scan was negative. Cardiology therefore asked to evaluate. Patient states she has had chest pain and dyspnea with exertion for approximately 6 years. She was recently seen at [facility] as described above with chest pain. She developed pain in her left arm which radiated to her chest. No associated symptoms. Not pleuritic, positional or related to food. Lasted several days. She does have dyspnea on exertion but no orthopnea, PND or pedal edema. No syncope. Occasional palpitations.  Current Outpatient Prescriptions  Medication Sig Dispense Refill  . acyclovir (ZOVIRAX) 800 MG tablet Take 1 tablet (800 mg total) by mouth 3 (three) times daily. (Patient taking differently: Take 800 mg by mouth daily as needed. ) 21 tablet 1  . aspirin-acetaminophen-caffeine (EXCEDRIN MIGRAINE) 250-250-65 MG per tablet Take 1 tablet by mouth every 6 (six) hours as needed. For migraine    . diphenhydrAMINE (BENADRYL) 25 MG tablet Take 25 mg by mouth at bedtime as needed. For allergies    . hyoscyamine (LEVSIN SL) 0.125 MG SL tablet PLACE 1-2 TABLETS UNDER TONGUE EVERY 4 HOURS DAILY AS NEEDED FOR PAIN 30 tablet 6  . OXcarbazepine ER 300 MG TB24 Take 300 mg by mouth daily. 90 tablet 3  . pantoprazole (PROTONIX) 40 MG tablet TAKE 1 TABLET BY MOUTH EVERY DAY 90 tablet 1  . predniSONE (DELTASONE) 20 MG tablet Take 1 tablet by mouth as needed.     No current facility-administered medications for this visit.     Allergies  Allergen Reactions  . Azithromycin     gastritis  . Iodinated Diagnostic Agents Hives    Pt developed 2 hives around site of injection Lt antecubital that were itchy after leaving lobby post scan. Given 50 mg oral benadryl.      Past Medical History:  Diagnosis Date  . Abnormal thyroid function  test 03/15/2013   once, all normal since  . Allergic rhinitis 07/13/2011  . Anxiety attack 11/13/2011  . Bell's palsy 07/13/2011   Hx of, used to see Dr. Jacelyn Grip  . Chest pain, atypical 11/29/2011  . Fatigue 11/11/2015  . Fatty infiltration of liver 07/17/2015  . Folliculitis [id number]  . GERD (gastroesophageal reflux disease) 05/31/2011   and nausea, sees Alonza Bogus in [place]  . Gonorrhea 02/08/2013  . History of Meniere's disease 09/14/2011  . 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 IBS (irritable bowel syndrome) 05/31/2011   sees Alonza Bogus in GI - hx rectal bleeding  . Left hip pain 11/11/2015   Has a history of left hip s/p an injury 6 years ago. Wearing heals twisted ankle in a whole and the next day her hip started hurting and has bothered her off and on since then.   . Migraines 07/13/2011  . Optic neuritis 07/12/2011   sees opthomologist  . Other and unspecified ovarian cysts   . 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 Preventative health care 11/11/2015  . Urticaria 01/11/2012  . UTI (urinary tract infection) 11/11/2015  . Vaginitis and vulvovaginitis 11/11/2015    Past Surgical History:  Procedure Laterality Date  . APPENDECTOMY  2006   Enlarged  . BREAST ENHANCEMENT SURGERY  2008  . Cheek bones corrected     at 35 yo  . RHINOPLASTY     at 35 yo  . UPPER GASTROINTESTINAL ENDOSCOPY  Social History   Social History  . Marital status: Married    Spouse name: N/A  . Number of children: 1  . Years of education: N/A   Occupational History  . [employer]   Social History Main Topics  . Smoking status: Never Smoker  . Smokeless tobacco: Never Used  . Alcohol use 0.0 oz/week     Comment: ocassional-2-3 weekly  . Drug use: No  . Sexual activity: Yes    Partners: Male   Other Topics Concern  . Not on file   Social History Narrative   Work or School: Public relations account executive, applying for PA school in [place] Situation: lives with husband and son 61, step  daughter 46 and 66      Spiritual Beliefs: Christian      Lifestyle: dance videos; diet is good       Wears seat belt regularly       Family History  Problem Relation Age of Onset  . Heart disease Mother   . Hyperlipidemia Mother   . Rheum arthritis Mother   . Fibroids Mother     Uterine  . Fibrocystic breast disease Mother   . Other Mother     gastritis  . Hypertension Mother   . Hyperlipidemia Maternal Grandmother   . Osteoarthritis Maternal Grandmother   . Glaucoma Maternal Grandmother   . Kidney disease Maternal Grandmother   . Cancer Maternal Grandfather     Skin, prostate, throat, stomach  . Colon cancer Maternal Grandfather   . Other Father     gastritis  . Heart disease Father     arteriosclerosis  . Arthritis Father     femoral head necrosis b/l  . Dementia Paternal Grandmother   . Heart disease Paternal Uncle   . Breast cancer Maternal Aunt     ROS: no fevers or chills, productive cough, hemoptysis, dysphasia, odynophagia, melena, hematochezia, dysuria, hematuria, rash, seizure activity, orthopnea, PND, pedal edema, claudication. Remaining systems are negative.  Physical Exam:   Blood pressure 102/64, pulse 75, height 4\' 11"  (1.499 m), weight 116 lb (52.6 kg), last menstrual period 04/05/2016.  General:  Well developed/well nourished in NAD Skin warm/dry Patient not depressed No peripheral clubbing Back-normal HEENT-normal/normal eyelids Neck supple/normal carotid upstroke bilaterally; no bruits; no JVD; no thyromegaly chest - CTA/ normal expansion CV - RRR/normal S1 and S2; no murmurs, rubs or gallops;  PMI nondisplaced Abdomen -NT/ND, no HSM, no mass, + bowel sounds, no bruit 2+ femoral pulses, no bruits Ext-no edema, chords, 2+ DP Neuro-grossly nonfocal  ECG 04/12/2016-sinus rhythm with no ST changes.  A/P  1 Chest pain-symptoms are extremely atypical. We will arrange an exercise treadmill for risk stratification.  2 dyspnea-schedule  echocardiogram to evaluate LV function. Recent VQ scan negative.   3 Mild hyperlipidemia-per patient report. Management per primary care.   Kirk Ruths, MD

## 2016-05-08 ENCOUNTER — Ambulatory Visit (INDEPENDENT_AMBULATORY_CARE_PROVIDER_SITE_OTHER): Payer: Managed Care, Other (non HMO) | Admitting: Cardiology

## 2016-05-08 ENCOUNTER — Encounter: Payer: Self-pay | Admitting: Cardiology

## 2016-05-08 VITALS — BP 102/64 | HR 75 | Ht 59.0 in | Wt 116.0 lb

## 2016-05-08 DIAGNOSIS — R0602 Shortness of breath: Secondary | ICD-10-CM | POA: Diagnosis not present

## 2016-05-08 DIAGNOSIS — R072 Precordial pain: Secondary | ICD-10-CM | POA: Diagnosis not present

## 2016-05-08 NOTE — Patient Instructions (Signed)
Medication Instructions:   NO CHANGE  Testing/Procedures:  Your physician has requested that you have an echocardiogram. Echocardiography is a painless test that uses sound waves to create images of your heart. It provides your doctor with information about the size and shape of your heart and how well your heart's chambers and valves are working. This procedure takes approximately one hour. There are no restrictions for this procedure.   Your physician has requested that you have an exercise tolerance test. For further information please visit [email or web address]. Please also follow instruction sheet, as given.    Follow-Up:  Your physician recommends that you schedule a follow-up appointment in: AS NEEDED PENDING TEST RESULTS    Exercise Stress Electrocardiogram An exercise stress electrocardiogram is a test to check how blood flows to your heart. It is done to find areas of poor blood flow. You will need to walk on a treadmill for this test. The electrocardiogram will record your heartbeat when you are at rest and when you are exercising. BEFORE THE PROCEDURE  Do not have drinks with caffeine or foods with caffeine for 24 hours before the test, or as told by your doctor. This includes coffee, tea (even decaf tea), sodas, chocolate, and cocoa.  Follow your doctor's instructions about eating and drinking before the test.  Ask your doctor what medicines you should or should not take before the test. Take your medicines with water unless told by your doctor not to.  If you use an inhaler, bring it with you to the test.  Bring a snack to eat after the test.  Do not  smoke for 4 hours before the test.  Do not put lotions, powders, creams, or oils on your chest before the test.  Wear comfortable shoes and clothing. PROCEDURE  You will have patches put on your chest. Small areas of your chest may need to be shaved. Wires will be connected to the patches.  Your heart rate will be  watched while you are resting and while you are exercising.  You will walk on the treadmill. The treadmill will slowly get faster to raise your heart rate.  The test will take about 1-2 hours. AFTER THE PROCEDURE  Your heart rate and blood pressure will be watched after the test.  You may return to your normal diet, activities, and medicines or as told by your doctor.   This information is not intended to replace advice given to you by your health care provider. Make sure you discuss any questions you have with your health care provider.   Document Released: 12/05/2007 Document Revised: 07/09/2014 Document Reviewed: 02/23/2013 Elsevier Interactive Patient Education 2016 Elsevier Inc.    

## 2016-05-12 ENCOUNTER — Encounter (HOSPITAL_COMMUNITY): Payer: Self-pay

## 2016-05-12 ENCOUNTER — Emergency Department (HOSPITAL_COMMUNITY)
Admission: EM | Admit: 2016-05-12 | Discharge: 2016-05-13 | Disposition: A | Discharge status: Home / Self Care | Payer: Managed Care, Other (non HMO) | Attending: Emergency Medicine | Admitting: Emergency Medicine

## 2016-05-12 DIAGNOSIS — L509 Urticaria, unspecified: Secondary | ICD-10-CM | POA: Insufficient documentation

## 2016-05-12 DIAGNOSIS — Z79899 Other long term (current) drug therapy: Secondary | ICD-10-CM | POA: Diagnosis not present

## 2016-05-12 DIAGNOSIS — R55 Syncope and collapse: Secondary | ICD-10-CM | POA: Diagnosis not present

## 2016-05-12 DIAGNOSIS — R21 Rash and other nonspecific skin eruption: Secondary | ICD-10-CM | POA: Diagnosis present

## 2016-05-12 DIAGNOSIS — Z7982 Long term (current) use of aspirin: Secondary | ICD-10-CM | POA: Insufficient documentation

## 2016-05-12 NOTE — ED Triage Notes (Signed)
Pt brought in by [facility]. Pt c/o of hives on body and abdominal cramping. Pt was treated with 25mg  benadyl IV and 25mg  benadryl Po. Pt states that she has a hx of hives when she gets anxious. Pt went to urgent care today where she received prednisone. Pt still feeling anxious, airway clear, and minimal signs of rash.

## 2016-05-12 NOTE — ED Provider Notes (Signed)
Danielle Sims Provider Note   CSN: [id number] Arrival date & time: 05/12/16  2336  By signing my name below, I, Danielle Sims, attest that this documentation has been prepared under the direction and in the presence of Danielle Hacker, MD. Electronically Signed: [name], ED Scribe. 05/12/16. 12:12 AM.  History   Chief Complaint Chief Complaint  Patient presents with  . Abdominal Cramping  . Urticaria   The history is provided by the patient. No language interpreter was used.   HPI Comments: Danielle Sims is a 35 y.o. female with a hx of urticaria brought in by EMS who presents to the Emergency Department complaining of gradually worsening rash onset one day ago. Pt says she woke up with hives on her abdomen that gradually spread to the rest of her body throughout the day. She was seen at Urgent Care today where she was prescribed prednisone and told to take Benadryl for her symptoms. Pt has taken 4 prednisone today to mild relief. Pt was with her son when she began to have worsening hives, chest pressure, nausea, SOB, and lightheadedness. Pt was given 25 mg Benadryl IV and 25 mg PO. Pt noticed a slight rash to the abdomen prior to falling asleep, but was otherwise okay. She did have exposure to new food and food for her dogs that she has never had before. She notes similar chest pain two weeks ago without rash. She has had similar rash in the past but never this severe. 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 Pt notes intermittent abdominal pain and diarrhea over the past week.She denies hx of blood clots, recent long distance travel, fever, or vomiting.   Past Medical History:  Diagnosis Date  . Abnormal thyroid function test 03/15/2013   once, all normal since  . Allergic rhinitis 07/13/2011  . Anxiety attack 11/13/2011  . Bell's palsy 07/13/2011   Hx of, used to see Dr. Jacelyn Grip  . Chest pain, atypical 11/29/2011    . Fatigue 11/11/2015  . Fatty infiltration of liver 07/17/2015  . Folliculitis [id number]  . GERD (gastroesophageal reflux disease) 05/31/2011   and nausea, sees Alonza Bogus in [place]  . Gonorrhea 02/08/2013  . History of Meniere's disease 09/14/2011  . 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 IBS (irritable bowel syndrome) 05/31/2011   sees Alonza Bogus in GI - hx rectal bleeding  . Left hip pain 11/11/2015   Has a history of left hip s/p an injury 6 years ago. Wearing heals twisted ankle in a whole and the next day her hip started hurting and has bothered her off and on since then.   . Migraines 07/13/2011  . Optic neuritis 07/12/2011   sees opthomologist  . Other and unspecified ovarian cysts   . 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 Preventative health care 11/11/2015  . Urticaria 01/11/2012  . UTI (urinary tract infection) 11/11/2015  . Vaginitis and vulvovaginitis 11/11/2015    Patient Active Problem List   Diagnosis Date Noted  . Preventative health care 11/11/2015  . UTI (urinary tract infection) 11/11/2015  . Vaginitis and vulvovaginitis 11/11/2015  . Fatigue 11/11/2015  . Left hip pain 11/11/2015  . Fatty infiltration of liver 07/17/2015  . IBS (irritable bowel syndrome) 07/13/2014  . 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 Allergic state 07/13/2011  . 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 GERD 06/01/2010    Past Surgical History:  Procedure Laterality Date  . APPENDECTOMY  2006   Enlarged  . BREAST ENHANCEMENT SURGERY  2008  . Cheek bones corrected     at 35 yo  . RHINOPLASTY     at 35 yo  . UPPER GASTROINTESTINAL ENDOSCOPY      OB History    No data available       Home Medications    Prior to Admission medications   Medication Sig Start Date End Date Taking? Authorizing Provider  acyclovir (ZOVIRAX) 800 MG tablet Take 1 tablet (800 mg total) by mouth 3 (three) times daily. Patient taking differently: Take 800 mg by mouth daily as needed (outbreak).  04/23/13  Yes Mosie Lukes, MD  aspirin-acetaminophen-caffeine (EXCEDRIN MIGRAINE) [phone] MG per tablet Take 1 tablet by mouth every 6 (six) hours as needed. For migraine   Yes Historical Provider, MD  diphenhydrAMINE (BENADRYL) 25 MG tablet Take 25 mg by mouth at bedtime as needed. For allergies 07/12/11  Yes Mosie Lukes, MD  hyoscyamine (LEVSIN SL) 0.125 MG SL tablet PLACE 1-2 TABLETS UNDER TONGUE EVERY 4 HOURS DAILY AS NEEDED FOR PAIN 09/30/15  Yes Danielle Shipper, MD  OXcarbazepine ER 300 MG TB24 Take 300 mg by mouth daily. 02/16/15  Yes Donika K Patel, DO  pantoprazole (PROTONIX) 40 MG tablet TAKE 1 TABLET BY MOUTH EVERY DAY 12/12/15  Yes Danielle Shipper, MD  predniSONE (DELTASONE) 20 MG tablet Take 5-20 tablets by mouth daily as needed (Bell's Palsey).    Yes Historical Provider, MD  predniSONE (DELTASONE) 5 MG tablet Take 5-10 mg by mouth See admin instructions. Day 1: Two tablets before breakfast, one after lunch, one after dinner, and two at bedtime. If started late in the day, take two tablets every hour for three hours, unless otherwise directed by prescriber. Day 2: One tablet before breakfast, one after lunch, one after dinner, and two at bedtime Day 3: One tablet before breakfast, one after lunch, one after dinner, and one at bedtime Day 4: One tablet before breakfast, one after lunch, and one at bedtime Day 5: One tablet before breakfast and one at bedtime Day 6: One tablet before breakfast   Yes Historical Provider, MD  EPINEPHrine 0.3 mg/0.3 mL IJ SOAJ injection Inject 0.3 mLs (0.3 mg total) into the muscle once. 05/13/16 05/13/16  Danielle Hacker, MD  famotidine (PEPCID) 20 MG tablet Take 1 tablet (20 mg total) by mouth 2 (two) times daily. 05/13/16   Danielle Hacker, MD  hydrOXYzine (ATARAX/VISTARIL) 25 MG tablet Take 1 tablet (25 mg total) by mouth every 6 (six) hours as needed for itching. 05/13/16   Danielle Hacker, MD    Family History Family History  Problem Relation Age of Onset  . Heart  disease Mother   . Hyperlipidemia Mother   . Rheum arthritis Mother   . Fibroids Mother     Uterine  . Fibrocystic breast disease Mother   . Other Mother     gastritis  . Hypertension Mother   . Hyperlipidemia Maternal Grandmother   . Osteoarthritis Maternal Grandmother   . Glaucoma Maternal Grandmother   . Kidney disease Maternal Grandmother   . Cancer Maternal Grandfather     Skin, prostate, throat, stomach  . Colon cancer Maternal Grandfather   . Other Father     gastritis  . Heart disease Father     arteriosclerosis  . Arthritis Father     femoral head necrosis b/l  . Dementia Paternal Grandmother   . Heart disease Paternal  Uncle   . Breast cancer Maternal Aunt     Social History Social History  Substance Use Topics  . Smoking status: Never Smoker  . Smokeless tobacco: Never Used  . Alcohol use 0.0 oz/week     Comment: ocassional-2-3 weekly     Allergies   Azithromycin and Iodinated diagnostic agents   Review of Systems Review of Systems  Constitutional: Negative for fever.  Respiratory: Positive for shortness of breath.   Cardiovascular: Positive for chest pain.  Gastrointestinal: Positive for abdominal pain, diarrhea and nausea. Negative for vomiting.  Skin: Positive for rash.  Neurological: Positive for light-headedness.  Psychiatric/Behavioral: The patient is nervous/anxious.   All other systems reviewed and are negative.  Physical Exam Updated Vital Signs BP 112/60   Pulse 76   Resp 15   Ht 4\' 11"  (1.499 m)   Wt 112 lb (50.8 kg)   LMP 04/20/2016   SpO2 99%   BMI 22.62 kg/m   Physical Exam  Constitutional: She is oriented to person, place, and time. She appears well-developed and well-nourished. No distress.  HENT:  Head: Normocephalic and atraumatic.  Mouth/Throat: No oropharyngeal exudate.  Cardiovascular: Normal rate, regular rhythm and normal heart sounds.   No murmur heard. Pulmonary/Chest: Effort normal and breath sounds normal. No  respiratory distress. She has no wheezes.  Abdominal: Soft. Bowel sounds are normal. There is no tenderness. There is no guarding.  Neurological: She is alert and oriented to person, place, and time.  Skin: Skin is warm and dry. Rash noted.  Mild scattered hives mostly over the arms and one small patch over the chin  Psychiatric: She has a normal mood and affect.  Nursing note and vitals reviewed.    ED Treatments / Results  DIAGNOSTIC STUDIES: Oxygen Saturation is 99% on RA, normal by my interpretation.    COORDINATION OF CARE: 12:10 AM-Discussed treatment plan which includes labs, EKG, and x-ray with pt at bedside and pt agreed to plan.    Labs (all labs ordered are listed, but only abnormal results are displayed) Labs Reviewed  URINALYSIS, ROUTINE W REFLEX MICROSCOPIC (NOT AT [facility]) - Abnormal; Notable for the following:       Result Value   APPearance CLOUDY (*)    All other components within normal limits  I-STAT CHEM 8, ED - Abnormal; Notable for the following:    Glucose, Bld 155 (*)    Hemoglobin 15.6 (*)    All other components within normal limits  PREGNANCY, URINE  I-STAT TROPOININ, ED    EKG  EKG Interpretation  Date/Time:  Sunday May 13 2016 00:33:07 EST Ventricular Rate:  71 PR Interval:    QRS Duration: 71 QT Interval:  407 QTC Calculation: 443 R Axis:   57 Text Interpretation:  Sinus rhythm Confirmed by Dina Rich  MD, Jyllian Haynie ([id number]) on 05/13/2016 12:45:22 AM       Radiology Dg Abdomen Acute W/chest  Result Date: 05/13/2016 CLINICAL DATA:  Chest and abdominal pain today.  Hives.  Nonsmoker. EXAM: DG ABDOMEN ACUTE W/ 1V CHEST COMPARISON:  Chest 04/04/2016 FINDINGS: Normal heart size and pulmonary vascularity. No focal airspace disease or consolidation in the lungs. No blunting of costophrenic angles. No pneumothorax. Mediastinal contours appear intact. Gas and stool throughout the colon. No small or large bowel distention. No free intra-abdominal  air. No abnormal air-fluid levels. No radiopaque stones. Surgical clip in the right lower quadrant. Visualized bones appear intact. IMPRESSION: No evidence of active pulmonary disease. Nonobstructive bowel gas pattern with  stool-filled colon. Electronically Signed   By: Lucienne Capers M.D.   On: 05/13/2016 01:27    Procedures Procedures (including critical care time)  Medications Ordered in ED Medications  sodium chloride 0.9 % bolus 1,000 mL (0 mLs Intravenous Stopped 05/13/16 0155)  hydrOXYzine (ATARAX/VISTARIL) tablet 50 mg (50 mg Oral Given 05/13/16 0023)  diphenhydrAMINE (BENADRYL) injection 25 mg (25 mg Intravenous Given 05/13/16 0136)  famotidine (PEPCID) IVPB 20 mg premix (0 mg Intravenous Stopped 05/13/16 0217)  LORazepam (ATIVAN) injection 0.5 mg (0.5 mg Intravenous Given 05/13/16 0135)     Initial Impression / Assessment and Plan / ED Course  I have reviewed the triage vital signs and the nursing notes.  Pertinent labs & imaging results that were available during my care of the patient were reviewed by me and considered in my medical decision making (see chart for details).  Clinical Course as of May 13 252  Sun May 13, 2016  0129 Hives migrating.  Redosed benadryl.  [CH]    Clinical Course User Index [CH] Danielle Hacker, MD    Patient presents with persistent hives. Treated appropriately with steroids. Had recurrent worsening hives and a near syncopal episode. Also reports chest pain shortness of breath. Recent similar episode. She is seeing cardiology. Notable for a stress test. Had a negative VQ at [facility].  Unclear etiology of hives. She is otherwise nontoxic. No signs or symptoms of anaphylaxis.  Patient given Atarax, Ativan, Pepcid. See clinical course above.  2:54 AM Patient resting comfortably. Hives mostly resolved. Continues to be stable. Will add Pepcid and Atarax to her daily Benadryl and prednisone. She will also be given an EpiPen. Follow-up with her primary  physician.  After history, exam, and medical workup I feel the patient has been appropriately medically screened and is safe for discharge home. Pertinent diagnoses were discussed with the patient. Patient was given return precautions.   Final Clinical Impressions(s) / ED Diagnoses   Final diagnoses:  Hives  Near syncope   I personally performed the services described in this documentation, which was scribed in my presence. The recorded information has been reviewed and is accurate.  New Prescriptions New Prescriptions   EPINEPHRINE 0.3 MG/0.3 ML IJ SOAJ INJECTION    Inject 0.3 mLs (0.3 mg total) into the muscle once.   FAMOTIDINE (PEPCID) 20 MG TABLET    Take 1 tablet (20 mg total) by mouth 2 (two) times daily.   HYDROXYZINE (ATARAX/VISTARIL) 25 MG TABLET    Take 1 tablet (25 mg total) by mouth every 6 (six) hours as needed for itching.     Danielle Hacker, MD 05/13/16 [phone]

## 2016-05-13 ENCOUNTER — Emergency Department (HOSPITAL_COMMUNITY): Payer: Managed Care, Other (non HMO)

## 2016-05-13 LAB — I-STAT CHEM 8, ED
BUN: 15 mg/dL (ref 6–20)
CREATININE: 0.7 mg/dL (ref 0.44–1.00)
Calcium, Ion: 1.23 mmol/L (ref 1.15–1.40)
Chloride: 104 mmol/L (ref 101–111)
Glucose, Bld: 155 mg/dL — ABNORMAL HIGH (ref 65–99)
HEMATOCRIT: 46 % (ref 36.0–46.0)
Hemoglobin: 15.6 g/dL — ABNORMAL HIGH (ref 12.0–15.0)
POTASSIUM: 4 mmol/L (ref 3.5–5.1)
Sodium: 139 mmol/L (ref 135–145)
TCO2: 22 mmol/L (ref 0–100)

## 2016-05-13 LAB — URINALYSIS, ROUTINE W REFLEX MICROSCOPIC
BILIRUBIN URINE: NEGATIVE
Glucose, UA: NEGATIVE mg/dL
HGB URINE DIPSTICK: NEGATIVE
Ketones, ur: NEGATIVE mg/dL
Leukocytes, UA: NEGATIVE
Nitrite: NEGATIVE
PH: 5 (ref 5.0–8.0)
Protein, ur: NEGATIVE mg/dL
SPECIFIC GRAVITY, URINE: 1.026 (ref 1.005–1.030)

## 2016-05-13 LAB — PREGNANCY, URINE: PREG TEST UR: NEGATIVE

## 2016-05-13 LAB — I-STAT TROPONIN, ED: Troponin i, poc: 0 ng/mL (ref 0.00–0.08)

## 2016-05-13 MED ORDER — HYDROXYZINE HCL 25 MG PO TABS
50.0000 mg | ORAL_TABLET | Freq: Once | ORAL | Status: AC
  Administered 2016-05-13: 50 mg via ORAL
  Filled 2016-05-13: qty 2

## 2016-05-13 MED ORDER — FAMOTIDINE IN NACL 20-0.9 MG/50ML-% IV SOLN
20.0000 mg | Freq: Once | INTRAVENOUS | Status: AC
  Administered 2016-05-13: 20 mg via INTRAVENOUS
  Filled 2016-05-13: qty 50

## 2016-05-13 MED ORDER — FAMOTIDINE 20 MG PO TABS: 20.0000 mg | ORAL_TABLET | Freq: Two times a day (BID) | ORAL | 0 refills | Status: DC

## 2016-05-13 MED ORDER — LORAZEPAM 2 MG/ML IJ SOLN
0.5000 mg | Freq: Once | INTRAMUSCULAR | Status: AC
  Administered 2016-05-13: 0.5 mg via INTRAVENOUS
  Filled 2016-05-13: qty 1

## 2016-05-13 MED ORDER — DIPHENHYDRAMINE HCL 50 MG/ML IJ SOLN
25.0000 mg | Freq: Once | INTRAMUSCULAR | Status: AC
  Administered 2016-05-13: 25 mg via INTRAVENOUS
  Filled 2016-05-13: qty 1

## 2016-05-13 MED ORDER — EPINEPHRINE 0.3 MG/0.3ML IJ SOAJ: 0.3000 mg | Freq: Once | INTRAMUSCULAR | 0 refills | Status: AC

## 2016-05-13 MED ORDER — SODIUM CHLORIDE 0.9 % IV BOLUS (SEPSIS)
1000.0000 mL | Freq: Once | INTRAVENOUS | Status: AC
  Administered 2016-05-13: 1000 mL via INTRAVENOUS

## 2016-05-13 MED ORDER — HYDROXYZINE HCL 25 MG PO TABS: 25.0000 mg | ORAL_TABLET | Freq: Four times a day (QID) | ORAL | 0 refills | Status: DC | PRN

## 2016-05-13 NOTE — ED Notes (Signed)
Pt stable, understands discharge instructions, and reasons for return.   

## 2016-05-13 NOTE — Discharge Instructions (Signed)
You were seen today for hives. Cause of your hives is unknown. Continue prednisone at home.  I will add Pepcid and Atarax to your regimen.  He will also be given an EpiPen in the event that you have an anaphylactic reaction. Follow-up with your primary physician. Follow-up with cardiology as previously scheduled for further cardiac workup.

## 2016-05-14 ENCOUNTER — Ambulatory Visit (INDEPENDENT_AMBULATORY_CARE_PROVIDER_SITE_OTHER): Payer: Managed Care, Other (non HMO) | Admitting: Family Medicine

## 2016-05-14 ENCOUNTER — Encounter: Payer: Self-pay | Admitting: Family Medicine

## 2016-05-14 VITALS — BP 99/58 | HR 69 | Temp 98.7°F | Ht 59.0 in | Wt 115.1 lb

## 2016-05-14 DIAGNOSIS — T7840XD Allergy, unspecified, subsequent encounter: Secondary | ICD-10-CM

## 2016-05-14 DIAGNOSIS — R55 Syncope and collapse: Secondary | ICD-10-CM | POA: Insufficient documentation

## 2016-05-14 DIAGNOSIS — L509 Urticaria, unspecified: Secondary | ICD-10-CM | POA: Diagnosis not present

## 2016-05-14 MED ORDER — LORATADINE 10 MG PO TABS: 10.0000 mg | ORAL_TABLET | Freq: Two times a day (BID) | ORAL | Status: AC | PRN

## 2016-05-14 MED ORDER — LORAZEPAM 0.5 MG PO TABS: 0.5000 mg | ORAL_TABLET | Freq: Two times a day (BID) | ORAL | 1 refills | Status: DC | PRN

## 2016-05-14 NOTE — Progress Notes (Signed)
Pre visit review using our clinic review tool, if applicable. No additional management support is needed unless otherwise documented below in the visit note. 

## 2016-05-14 NOTE — Patient Instructions (Addendum)
Zyrtec/Cetirizine 10 mg and ZantacRanitidine 150 mg both twice daily  Hives Hives are itchy, red, swollen areas of the skin. They can vary in size and location on your body. Hives can come and go for hours or several days (acute hives) or for several weeks (chronic hives). Hives do not spread from person to person (noncontagious). They may get worse with scratching, exercise, and emotional stress. CAUSES   Allergic reaction to food, additives, or drugs.  Infections, including the common cold.  Illness, such as vasculitis, lupus, or thyroid disease.  Exposure to sunlight, heat, or cold.  Exercise.  Stress.  Contact with chemicals. SYMPTOMS   Red or white swollen patches on the skin. The patches may change size, shape, and location quickly and repeatedly.  Itching.  Swelling of the hands, feet, and face. This may occur if hives develop deeper in the skin. DIAGNOSIS  Your caregiver can usually tell what is wrong by performing a physical exam. Skin or blood tests may also be done to determine the cause of your hives. In some cases, the cause cannot be determined. TREATMENT  Mild cases usually get better with medicines such as antihistamines. Severe cases may require an emergency epinephrine injection. If the cause of your hives is known, treatment includes avoiding that trigger.  HOME CARE INSTRUCTIONS   Avoid causes that trigger your hives.  Take antihistamines as directed by your caregiver to reduce the severity of your hives. Non-sedating or low-sedating antihistamines are usually recommended. Do not drive while taking an antihistamine.  Take any other medicines prescribed for itching as directed by your caregiver.  Wear loose-fitting clothing.  Keep all follow-up appointments as directed by your caregiver. SEEK MEDICAL CARE IF:   You have persistent or severe itching that is not relieved with medicine.  You have painful or swollen joints. SEEK IMMEDIATE MEDICAL CARE IF:    You have a fever.  Your tongue or lips are swollen.  You have trouble breathing or swallowing.  You feel tightness in the throat or chest.  You have abdominal pain. These problems may be the first sign of a life-threatening allergic reaction. Call your local emergency services (911 in [place] MAKE SURE YOU:   Understand these instructions.  Will watch your condition.  Will get help right away if you are not doing well or get worse.   This information is not intended to replace advice given to you by your health care provider. Make sure you discuss any questions you have with your health care provider.   Document Released: 06/18/2005 Document Revised: 06/23/2013 Document Reviewed: 09/11/2011 Elsevier Interactive Patient Education [employer].

## 2016-05-16 ENCOUNTER — Telehealth (HOSPITAL_COMMUNITY): Payer: Self-pay

## 2016-05-16 NOTE — Telephone Encounter (Signed)
Encounter complete. 

## 2016-05-17 ENCOUNTER — Inpatient Hospital Stay (HOSPITAL_COMMUNITY): Admission: RE | Admit: 2016-05-17 | Payer: Managed Care, Other (non HMO) | Source: Ambulatory Visit

## 2016-05-28 NOTE — Assessment & Plan Note (Signed)
Had an allergic reaction with rash and had to present to the hospital is doing much better. But still has flares of the rash. Encouraged Zyrtec and Pepcis bid for next month and report if worsens.

## 2016-05-28 NOTE — Progress Notes (Signed)
Patient ID: Danielle Sims, female   DOB: December 13, 1980, 35 y.o.   MRN: [id number]   Subjective:    Patient ID: Danielle Sims, female    DOB: Apr 26, 1981, 35 y.o.   MRN: [id number]  Chief Complaint  Patient presents with  . Follow-up    HPI Patient is in today for follow up. She is feeling well today but has been struggling with recurrent pruritic rash which got bad enough recently to require a trip to ER for steroids. This has helped to a good degree but it still keeps flaring to a lesser degree. No obvious new products. She does acknowledge she has been under a great deal of stress recently with work, family and school.Denies CP/palp/SOB/HA/congestion/fevers/GI or GU c/o. Taking meds as prescribed  Past Medical History:  Diagnosis Date  . Abnormal thyroid function test 03/15/2013   once, all normal since  . Allergic rhinitis 07/13/2011  . Anxiety attack 11/13/2011  . Bell's palsy 07/13/2011   Hx of, used to see Dr. Jacelyn Grip  . Chest pain, atypical 11/29/2011  . Fatigue 11/11/2015  . Fatty infiltration of liver 07/17/2015  . Folliculitis [id number]  . GERD (gastroesophageal reflux disease) 05/31/2011   and nausea, sees Alonza Bogus in [place]  . Gonorrhea 02/08/2013  . History of Meniere's disease 09/14/2011  . 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 IBS (irritable bowel syndrome) 05/31/2011   sees Alonza Bogus in [place] - hx rectal bleeding  . Left hip pain 11/11/2015   Has a history of left hip s/p an injury 6 years ago. Wearing heals twisted ankle in a whole and the next day her hip started hurting and has bothered her off and on since then.   . Migraines 07/13/2011  . Optic neuritis 07/12/2011   sees opthomologist  . Other and unspecified ovarian cysts   . 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 Preventative health care 11/11/2015  . Urticaria 01/11/2012  . UTI (urinary tract infection) 11/11/2015  . Vaginitis and vulvovaginitis 11/11/2015    Past Surgical History:  Procedure Laterality Date  . APPENDECTOMY  2006   Enlarged  .  BREAST ENHANCEMENT SURGERY  2008  . Cheek bones corrected     at 35 yo  . RHINOPLASTY     at 35 yo  . UPPER GASTROINTESTINAL ENDOSCOPY      Family History  Problem Relation Age of Onset  . Heart disease Mother   . Hyperlipidemia Mother   . Rheum arthritis Mother   . Fibroids Mother     Uterine  . Fibrocystic breast disease Mother   . Other Mother     gastritis  . Hypertension Mother   . Hyperlipidemia Maternal Grandmother   . Osteoarthritis Maternal Grandmother   . Glaucoma Maternal Grandmother   . Kidney disease Maternal Grandmother   . Cancer Maternal Grandfather     Skin, prostate, throat, stomach  . Colon cancer Maternal Grandfather   . Other Father     gastritis  . Heart disease Father     arteriosclerosis  . Arthritis Father     femoral head necrosis b/l  . Dementia Paternal Grandmother   . Heart disease Paternal Uncle   . Breast cancer Maternal Aunt     Social History   Social History  . Marital status: Married    Spouse name: N/A  . Number of children: 1  . Years of education: N/A   Occupational History  . [employer]   Social History Main Topics  . Smoking  status: Never Smoker  . Smokeless tobacco: Never Used  . Alcohol use 0.0 oz/week     Comment: ocassional-2-3 weekly  . Drug use: No  . Sexual activity: Yes    Partners: Male   Other Topics Concern  . Not on file   Social History Narrative   Work or School: Public relations account executive, applying for PA school in [place] Situation: lives with husband and son 55, step daughter 17 and 51      Spiritual Beliefs: Christian      Lifestyle: dance videos; diet is good       Wears seat belt regularly       Outpatient Medications Prior to Visit  Medication Sig Dispense Refill  . acyclovir (ZOVIRAX) 800 MG tablet Take 1 tablet (800 mg total) by mouth 3 (three) times daily. (Patient taking differently: Take 800 mg by mouth daily as needed (outbreak). ) 21 tablet 1  .  aspirin-acetaminophen-caffeine (EXCEDRIN MIGRAINE) [id number] MG per tablet Take 1 tablet by mouth every 6 (six) hours as needed. For migraine    . diphenhydrAMINE (BENADRYL) 25 MG tablet Take 25 mg by mouth at bedtime as needed. For allergies    . famotidine (PEPCID) 20 MG tablet Take 1 tablet (20 mg total) by mouth 2 (two) times daily. 30 tablet 0  . hydrOXYzine (ATARAX/VISTARIL) 25 MG tablet Take 1 tablet (25 mg total) by mouth every 6 (six) hours as needed for itching. 30 tablet 0  . hyoscyamine (LEVSIN SL) 0.125 MG SL tablet PLACE 1-2 TABLETS UNDER TONGUE EVERY 4 HOURS DAILY AS NEEDED FOR PAIN 30 tablet 6  . OXcarbazepine ER 300 MG TB24 Take 300 mg by mouth daily. 90 tablet 3  . pantoprazole (PROTONIX) 40 MG tablet TAKE 1 TABLET BY MOUTH EVERY DAY 90 tablet 1  . predniSONE (DELTASONE) 20 MG tablet Take 5-20 tablets by mouth daily as needed (Bell's Palsey).     . predniSONE (DELTASONE) 5 MG tablet Take 5-10 mg by mouth See admin instructions. Day 1: Two tablets before breakfast, one after lunch, one after dinner, and two at bedtime. If started late in the day, take two tablets every hour for three hours, unless otherwise directed by prescriber. Day 2: One tablet before breakfast, one after lunch, one after dinner, and two at bedtime Day 3: One tablet before breakfast, one after lunch, one after dinner, and one at bedtime Day 4: One tablet before breakfast, one after lunch, and one at bedtime Day 5: One tablet before breakfast and one at bedtime Day 6: One tablet before breakfast     No facility-administered medications prior to visit.     Allergies  Allergen Reactions  . Azithromycin     gastritis  . Iodinated Diagnostic Agents Hives    Pt developed 2 hives around site of injection Lt antecubital that were itchy after leaving lobby post scan. Given 50 mg oral benadryl.     Review of Systems  Constitutional: Negative for fever and malaise/fatigue.  HENT: Negative for congestion.     Eyes: Negative for blurred vision.  Respiratory: Negative for shortness of breath and wheezing.   Cardiovascular: Negative for chest pain, palpitations and leg swelling.  Gastrointestinal: Negative for abdominal pain, blood in stool and nausea.  Genitourinary: Negative for dysuria and frequency.  Musculoskeletal: Negative for falls.  Skin: Positive for itching and rash.  Neurological: Negative for dizziness, loss of consciousness and headaches.  Endo/Heme/Allergies: Negative for environmental allergies.  Psychiatric/Behavioral:  Negative for depression. The patient is not nervous/anxious.        Objective:    Physical Exam  Constitutional: She is oriented to person, place, and time. She appears well-developed and well-nourished. No distress.  HENT:  Head: Normocephalic and atraumatic.  Nose: Nose normal.  Eyes: Right eye exhibits no discharge. Left eye exhibits no discharge.  Neck: Normal range of motion. Neck supple.  Cardiovascular: Normal rate and regular rhythm.   No murmur heard. Pulmonary/Chest: Effort normal and breath sounds normal.  Abdominal: Soft. Bowel sounds are normal. There is no tenderness.  Musculoskeletal: She exhibits no edema.  Neurological: She is alert and oriented to person, place, and time.  Skin: Skin is warm and dry.  Psychiatric: She has a normal mood and affect.  Nursing note and vitals reviewed.   BP (!) 99/58 (BP Location: Right Arm, Patient Position: Sitting, Cuff Size: Normal)   Pulse 69   Temp 98.7 F (37.1 C) (Oral)   Ht 4\' 11"  (1.499 m)   Wt 115 lb 2 oz (52.2 kg)   LMP 04/20/2016   SpO2 100%   BMI 23.25 kg/m  Wt Readings from Last 3 Encounters:  05/14/16 115 lb 2 oz (52.2 kg)  05/12/16 112 lb (50.8 kg)  05/08/16 116 lb (52.6 kg)     Lab Results  Component Value Date   WBC 7.8 04/12/2016   HGB 15.6 (H) 05/13/2016   HCT 46.0 05/13/2016   PLT 380.0 04/12/2016   GLUCOSE 155 (H) 05/13/2016   CHOL 206 (H) 11/11/2015   TRIG 278.0  (H) 11/11/2015   HDL 42.50 11/11/2015   LDLDIRECT 120.0 11/11/2015   LDLCALC 111 (H) 10/12/2014   ALT 19 04/12/2016   AST 17 04/12/2016   NA 139 05/13/2016   K 4.0 05/13/2016   CL 104 05/13/2016   CREATININE 0.70 05/13/2016   BUN 15 05/13/2016   CO2 30 04/12/2016   TSH 1.36 04/12/2016   HGBA1C 5.8 10/12/2014    Lab Results  Component Value Date   TSH 1.36 04/12/2016   Lab Results  Component Value Date   WBC 7.8 04/12/2016   HGB 15.6 (H) 05/13/2016   HCT 46.0 05/13/2016   MCV 87.1 04/12/2016   PLT 380.0 04/12/2016   Lab Results  Component Value Date   NA 139 05/13/2016   K 4.0 05/13/2016   CO2 30 04/12/2016   GLUCOSE 155 (H) 05/13/2016   BUN 15 05/13/2016   CREATININE 0.70 05/13/2016   BILITOT 0.4 04/12/2016   ALKPHOS 61 04/12/2016   AST 17 04/12/2016   ALT 19 04/12/2016   PROT 7.5 04/12/2016   ALBUMIN 4.0 04/12/2016   CALCIUM 9.5 04/12/2016   ANIONGAP 12 06/05/2015   GFR 103.07 04/12/2016   Lab Results  Component Value Date   CHOL 206 (H) 11/11/2015   Lab Results  Component Value Date   HDL 42.50 11/11/2015   Lab Results  Component Value Date   LDLCALC 111 (H) 10/12/2014   Lab Results  Component Value Date   TRIG 278.0 (H) 11/11/2015   Lab Results  Component Value Date   CHOLHDL 5 11/11/2015   Lab Results  Component Value Date   HGBA1C 5.8 10/12/2014       Assessment & Plan:   Problem List Items Addressed This Visit    Allergic state    Had an allergic reaction with rash and had to present to the hospital is doing much better. But still has flares of the rash.  Encouraged Zyrtec and Pepcis bid for next month and report if worsens.       Syncope    Other Visit Diagnoses    Hives    -  Primary   Relevant Medications   LORazepam (ATIVAN) 0.5 MG tablet      I am having Ms. Garringer start on LORazepam and loratadine. I am also having her maintain her aspirin-acetaminophen-caffeine, diphenhydrAMINE, acyclovir, OXcarbazepine ER, predniSONE,  hyoscyamine, pantoprazole, predniSONE, famotidine, and hydrOXYzine.  Meds ordered this encounter  Medications  . LORazepam (ATIVAN) 0.5 MG tablet    Sig: Take 1 tablet (0.5 mg total) by mouth 2 (two) times daily as needed for anxiety.    Dispense:  10 tablet    Refill:  1  . loratadine (CLARITIN) 10 MG tablet    Sig: Take 1 tablet (10 mg total) by mouth 2 (two) times daily as needed for allergies.    Dispense:  60 tablet     Penni Homans, MD

## 2016-05-29 ENCOUNTER — Ambulatory Visit (INDEPENDENT_AMBULATORY_CARE_PROVIDER_SITE_OTHER): Payer: Managed Care, Other (non HMO)

## 2016-05-29 ENCOUNTER — Telehealth (HOSPITAL_COMMUNITY): Payer: Self-pay

## 2016-05-29 DIAGNOSIS — E538 Deficiency of other specified B group vitamins: Secondary | ICD-10-CM | POA: Diagnosis not present

## 2016-05-29 MED ORDER — CYANOCOBALAMIN 1000 MCG/ML IJ SOLN
1000.0000 ug | Freq: Once | INTRAMUSCULAR | Status: AC
  Administered 2016-05-29: 1000 ug via INTRAMUSCULAR

## 2016-05-29 NOTE — Telephone Encounter (Signed)
Encounter complete. 

## 2016-05-29 NOTE — Progress Notes (Signed)
Pre visit review using our clinic tool,if applicable. No additional management support is needed unless otherwise documented below in the visit note.   Patient in for B-12 injection due to B-12 deficiency per Dr. Charlett Blake. Given IM Left Deltoid. Patient tolerated well. Will return in 1 month. Appointment scheduled.

## 2016-05-30 ENCOUNTER — Encounter (HOSPITAL_COMMUNITY): Payer: Self-pay | Admitting: *Deleted

## 2016-05-30 ENCOUNTER — Ambulatory Visit (HOSPITAL_COMMUNITY)
Admission: RE | Admit: 2016-05-30 | Discharge: 2016-05-30 | Disposition: A | Discharge status: Home / Self Care | Payer: Managed Care, Other (non HMO) | Source: Ambulatory Visit | Attending: Cardiovascular Disease | Admitting: Cardiovascular Disease

## 2016-05-30 DIAGNOSIS — R072 Precordial pain: Secondary | ICD-10-CM | POA: Diagnosis not present

## 2016-05-30 NOTE — Progress Notes (Signed)
Ett reviewed by Dr. Sallyanne Kuster, pt ok to leave.

## 2016-05-31 ENCOUNTER — Ambulatory Visit (HOSPITAL_COMMUNITY): Payer: Managed Care, Other (non HMO) | Attending: Internal Medicine

## 2016-05-31 ENCOUNTER — Other Ambulatory Visit: Payer: Self-pay

## 2016-05-31 DIAGNOSIS — R072 Precordial pain: Secondary | ICD-10-CM | POA: Diagnosis not present

## 2016-05-31 DIAGNOSIS — I340 Nonrheumatic mitral (valve) insufficiency: Secondary | ICD-10-CM | POA: Diagnosis not present

## 2016-05-31 LAB — EXERCISE TOLERANCE TEST
CSEPED: 9 min
CSEPEDS: 39 s
CSEPHR: 96 %
Estimated workload: 11.1 METS
MPHR: 186 {beats}/min
Peak HR: 179 {beats}/min
RPE: 18
Rest HR: 71 {beats}/min

## 2016-06-15 ENCOUNTER — Other Ambulatory Visit: Payer: Self-pay | Admitting: Internal Medicine

## 2016-06-21 ENCOUNTER — Ambulatory Visit: Payer: Managed Care, Other (non HMO)

## 2016-06-21 NOTE — Progress Notes (Unsigned)
Patient no show for B12 injection scheduled for today.

## 2016-09-24 ENCOUNTER — Ambulatory Visit (INDEPENDENT_AMBULATORY_CARE_PROVIDER_SITE_OTHER): Payer: 59 | Admitting: Family Medicine

## 2016-09-24 ENCOUNTER — Encounter: Payer: Self-pay | Admitting: Family Medicine

## 2016-09-24 VITALS — BP 114/74 | HR 68 | Temp 98.3°F | Ht 59.0 in | Wt 114.2 lb

## 2016-09-24 DIAGNOSIS — R5383 Other fatigue: Secondary | ICD-10-CM | POA: Diagnosis not present

## 2016-09-24 NOTE — Patient Instructions (Signed)
We will start by checking a thyroid and b12 level for you today.  I will be in touch with these results asap and we can plan the next step Take care!

## 2016-09-24 NOTE — Progress Notes (Signed)
Pre visit review using our clinic review tool, if applicable. No additional management support is needed unless otherwise documented below in the visit note. 

## 2016-09-24 NOTE — Progress Notes (Signed)
[facility] [address] [place], [place] [address] [phone]  Date:  09/24/2016   Name:  Danielle Sims   DOB:  03/15/1981   MRN:  [id number]  PCP:  Penni Homans, MD    Chief Complaint: Fatigue (pt. reports extreme fatigue & tiredness x 1 month now)   History of Present Illness:  Danielle Sims is a 36 y.o. very pleasant female patient who presents with the following:  Presenting today with headaches and fatigue.  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   She has been feeling very worn out for about one month.  Thinks that she is getting enough sleep but still does not feel rested.  She does not nore She is not sure if this could be due to low vitamin B12-  Her most recent B12 level in October was low She also wonders about a thyroid issue-  Lab Results  Component Value Date   TSH 1.36 04/12/2016   She has noted some thinning hair, and is more constipated than usual She has noted a pain behind her right ear- the ear itself does not hurt and she has not felt any mass.  However when she washes her hair and presses right on this spot it can be tender  Wt Readings from Last 3 Encounters:  09/24/16 114 lb 3.2 oz (51.8 kg)  05/14/16 115 lb 2 oz (52.2 kg)  05/12/16 112 lb (50.8 kg)   Her husband is s/p vasectomy and she has no other partners so she is not concerned about pregnancy.  They have a 2 yo son.  Her menses are always irregular due to her PCOS  Patient Active Problem List   Diagnosis Date Noted  . Syncope 05/14/2016  . Preventative health care 11/11/2015  . UTI (urinary tract infection) 11/11/2015  . Vaginitis and vulvovaginitis 11/11/2015  . Fatigue 11/11/2015  . Left hip pain 11/11/2015  . Fatty infiltration of liver 07/17/2015  . IBS (irritable bowel syndrome) 07/13/2014  . 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 Allergic state 07/13/2011  . 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 GERD 06/01/2010    Past Medical History:  Diagnosis Date  . Abnormal thyroid function test 03/15/2013   once, all normal since  . Allergic rhinitis 07/13/2011  . Anxiety attack 11/13/2011  . Bell's palsy 07/13/2011   Hx of, used to see Dr. Jacelyn Grip  . Chest pain, atypical 11/29/2011  . Fatigue 11/11/2015  . Fatty infiltration of liver 07/17/2015  . Folliculitis 3/38/2505  . GERD (gastroesophageal reflux disease) 05/31/2011   and nausea, sees Alonza Bogus in [place]  . Gonorrhea 02/08/2013  . History of Meniere's disease 09/14/2011  . 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 IBS (irritable bowel syndrome) 05/31/2011   sees Alonza Bogus in GI - hx rectal bleeding  . Left hip pain 11/11/2015   Has a history of left hip s/p an injury 6 years ago. Wearing heals twisted ankle in a whole and the next day her hip started hurting and has bothered her off and on since then.   . Migraines 07/13/2011  . Optic neuritis 07/12/2011   sees opthomologist  . Other and unspecified ovarian cysts   . 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 Preventative health care 11/11/2015  . Urticaria 01/11/2012  . UTI (urinary tract infection) 11/11/2015  . Vaginitis and vulvovaginitis 11/11/2015    Past Surgical History:  Procedure Laterality  Date  . APPENDECTOMY  2006   Enlarged  . BREAST ENHANCEMENT SURGERY  2008  . Cheek bones corrected     at 36 yo  . RHINOPLASTY     at 36 yo  . UPPER GASTROINTESTINAL ENDOSCOPY      Social History  Substance Use Topics  . Smoking status: Never Smoker  . Smokeless tobacco: Never Used  . Alcohol use 0.0 oz/week     Comment: ocassional-2-3 weekly    Family History  Problem Relation Age of Onset  . Heart disease Mother   . Hyperlipidemia Mother   . Rheum arthritis Mother   . Fibroids Mother     Uterine  . Fibrocystic breast disease Mother   . Other Mother     gastritis  . Hypertension Mother   . Hyperlipidemia Maternal Grandmother   . Osteoarthritis Maternal Grandmother   .  Glaucoma Maternal Grandmother   . Kidney disease Maternal Grandmother   . Cancer Maternal Grandfather     Skin, prostate, throat, stomach  . Colon cancer Maternal Grandfather   . Other Father     gastritis  . Heart disease Father     arteriosclerosis  . Arthritis Father     femoral head necrosis b/l  . Dementia Paternal Grandmother   . Heart disease Paternal Uncle   . Breast cancer Maternal Aunt     Allergies  Allergen Reactions  . Azithromycin     gastritis  . Iodinated Diagnostic Agents Hives    Pt developed 2 hives around site of injection Lt antecubital that were itchy after leaving lobby post scan. Given 50 mg oral benadryl.     Medication list has been reviewed and updated.  Current Outpatient Prescriptions on File Prior to Visit  Medication Sig Dispense Refill  . acyclovir (ZOVIRAX) 800 MG tablet Take 1 tablet (800 mg total) by mouth 3 (three) times daily. (Patient taking differently: Take 800 mg by mouth daily as needed (outbreak). ) 21 tablet 1  . aspirin-acetaminophen-caffeine (EXCEDRIN MIGRAINE) 315-400-86 MG per tablet Take 1 tablet by mouth every 6 (six) hours as needed. For migraine    . diphenhydrAMINE (BENADRYL) 25 MG tablet Take 25 mg by mouth at bedtime as needed. For allergies    . hyoscyamine (LEVSIN SL) 0.125 MG SL tablet PLACE 1-2 TABLETS UNDER TONGUE EVERY 4 HOURS DAILY AS NEEDED FOR PAIN 30 tablet 6  . loratadine (CLARITIN) 10 MG tablet Take 1 tablet (10 mg total) by mouth 2 (two) times daily as needed for allergies. 60 tablet   . OXcarbazepine ER 300 MG TB24 Take 300 mg by mouth daily. 90 tablet 3  . pantoprazole (PROTONIX) 40 MG tablet TAKE 1 TABLET BY MOUTH EVERY DAY 90 tablet 1   No current facility-administered medications on file prior to visit.     Review of Systems:  No fever, chills, diarrhea, blood in stool  Physical Examination: Vitals:   09/24/16 1558  BP: 114/74  Pulse: 68  Temp: 98.3 F (36.8 C)   Vitals:   09/24/16 1558   Weight: 114 lb 3.2 oz (51.8 kg)  Height: 4\' 11"  (1.499 m)   Body mass index is 23.07 kg/m. Ideal Body Weight: Weight in (lb) to have BMI = 25: 123.5  GEN: WDWN, NAD, Non-toxic, A & O x 3, normal weight, looks well HEENT: Atraumatic, Normocephalic. Neck supple. No masses, No LAD.  Bilateral TM wnl, oropharynx normal.  PEERL,EOMI.  Her thyroid is perhaps a bit prominent but no nodules or  gross enlargement noted  Ears and Nose: No external deformity. CV: RRR, No M/G/R. No JVD. No thrill. No extra heart sounds. PULM: CTA B, no wheezes, crackles, rhonchi. No retractions. No resp. distress. No accessory muscle use. ABD: S, NT, ND EXTR: No c/c/e NEURO Normal gait.  PSYCH: Normally interactive. Conversant. Not depressed or anxious appearing.  Calm demeanor.    Assessment and Plan:  Other fatigue - Plan: TSH, B12   Here today with fatigue- may be due to school demands but she does also have a history of B12 def and a thyroid abnl. Will check these levels for her today and be back in touch asap   Signed Lamar Blinks, MD

## 2016-09-25 LAB — TSH: TSH: 3.19 u[IU]/mL (ref 0.35–4.50)

## 2016-09-25 LAB — VITAMIN B12: VITAMIN B 12: 181 pg/mL — AB (ref 211–911)

## 2016-09-26 ENCOUNTER — Encounter: Payer: Self-pay | Admitting: Family Medicine

## 2016-09-26 ENCOUNTER — Telehealth: Payer: Self-pay | Admitting: Family Medicine

## 2016-09-26 NOTE — Telephone Encounter (Signed)
Spouse calling back checking on the status of message below, patient states please send Rx to the preferred pharmacy and would like a call to know the status.   [id number] - [place], [place] - [address] [phone] (Phone) [phone] (Fax)

## 2016-09-26 NOTE — Telephone Encounter (Signed)
error:[id number] ° °

## 2016-09-26 NOTE — Telephone Encounter (Signed)
I patient spouse called in  Sublingual B12 does not work for her. She need an inject per her prior visits with Charlett Blake injections work best. They are requesting an rx for b12 injections. They are going out of town and asking for it today spouse asking for cal back [phone]

## 2016-09-26 NOTE — Telephone Encounter (Signed)
I am fine to give her a prescription for Vitamin B 12 mcg 1050mcg shots monthly a 6 month supply

## 2016-09-27 ENCOUNTER — Other Ambulatory Visit: Payer: Self-pay | Admitting: Family Medicine

## 2016-09-27 ENCOUNTER — Ambulatory Visit: Payer: Managed Care, Other (non HMO) | Admitting: Family Medicine

## 2016-09-27 DIAGNOSIS — E538 Deficiency of other specified B group vitamins: Secondary | ICD-10-CM

## 2016-09-27 MED ORDER — "SYRINGE/NEEDLE (DISP) 25G X 1"" 3 ML MISC": 1 refills | Status: AC

## 2016-09-27 MED ORDER — CYANOCOBALAMIN 1000 MCG/ML IJ SOLN: 1000.0000 ug | INTRAMUSCULAR | 6 refills | Status: DC

## 2016-09-27 NOTE — Telephone Encounter (Signed)
Called both numbers left detailed message to call back. Sent in vitamin B12 injection and needles[facility] to [facility] [place].

## 2016-09-27 NOTE — Telephone Encounter (Signed)
Called back both numbers. Left detailed message medication/needles sent to pharmacy.

## 2016-10-19 ENCOUNTER — Ambulatory Visit (INDEPENDENT_AMBULATORY_CARE_PROVIDER_SITE_OTHER): Payer: 59 | Admitting: Medical

## 2016-10-19 VITALS — BP 115/71 | HR 65 | Temp 98.0°F | Resp 16 | Ht 59.0 in | Wt 112.6 lb

## 2016-10-19 DIAGNOSIS — R197 Diarrhea, unspecified: Secondary | ICD-10-CM | POA: Diagnosis not present

## 2016-10-19 DIAGNOSIS — K649 Unspecified hemorrhoids: Secondary | ICD-10-CM

## 2016-10-19 MED ORDER — DIPHENOXYLATE-ATROPINE 2.5-0.025 MG PO TABS: 2.0000 | ORAL_TABLET | Freq: Four times a day (QID) | ORAL | 0 refills | Status: DC | PRN

## 2016-10-19 NOTE — Addendum Note (Signed)
Addended by: Anabel Halon on: 10/19/2016 10:05 PM   Modules accepted: Orders

## 2016-10-19 NOTE — Progress Notes (Addendum)
Subjective:    Patient ID: Danielle Sims, female    DOB: 19-Nov-1980, 36 y.o.   MRN: [id number]  HPI  Pt in with some diarrhea since Sunday. Today had 6 loose stools. Last night 5 loose stools.   Started on Sunday am. Started with diarrhea. 8 loose stools all day on first day. Monday and Tuesday 3 loose stools. Wednesday did ok per pt.  On Saturday does not report any suspicious foods. Pt states her husband had loose stools for one day on Monday. Son had one day loose stool on Tuesday as well. Both husbsand and son got better after only one day.  Pt has hx of ibs-c. Pt in past uses hycosamine .  Pt has no fever or chills. Mild fatigue.  Pt has not been vomiting.  LMP- 3 weeks ago.  Pt also states her hemorrhoids have recently flared with diarrhea. She has hydocortisone supp which she has used.   Review of Systems  Constitutional: Positive for fatigue. Negative for chills and fever.  Respiratory: Negative for cough, chest tightness, shortness of breath and wheezing.   Cardiovascular: Negative for chest pain and palpitations.  Gastrointestinal: Positive for abdominal pain and diarrhea. Negative for abdominal distention, anal bleeding, blood in stool, constipation, nausea, rectal pain and vomiting.       Hemorrhoid history. Recent flare after severe diarrhea.  Genitourinary: Negative for dysuria.  Musculoskeletal: Negative for back pain, myalgias and neck pain.  Skin: Negative for color change and rash.  Neurological: Negative for dizziness.  Hematological: Negative for adenopathy. Does not bruise/bleed easily.  Psychiatric/Behavioral: Negative for behavioral problems.    Past Medical History:  Diagnosis Date  . Abnormal thyroid function test 03/15/2013   once, all normal since  . Allergic rhinitis 07/13/2011  . Anxiety attack 11/13/2011  . Bell's palsy 07/13/2011   Hx of, used to see Dr. Jacelyn Grip  . Chest pain, atypical 11/29/2011  . Fatigue 11/11/2015  . Fatty infiltration of liver  07/17/2015  . Folliculitis 5/79/0383  . GERD (gastroesophageal reflux disease) 05/31/2011   and nausea, sees Alonza Bogus in [place]  . Gonorrhea 02/08/2013  . History of Meniere's disease 09/14/2011  . 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 IBS (irritable bowel syndrome) 05/31/2011   sees Alonza Bogus in GI - hx rectal bleeding  . Left hip pain 11/11/2015   Has a history of left hip s/p an injury 6 years ago. Wearing heals twisted ankle in a whole and the next day her hip started hurting and has bothered her off and on since then.   . Migraines 07/13/2011  . Optic neuritis 07/12/2011   sees opthomologist  . Other and unspecified ovarian cysts   . 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 Preventative health care 11/11/2015  . Urticaria 01/11/2012  . UTI (urinary tract infection) 11/11/2015  . Vaginitis and vulvovaginitis 11/11/2015     Social History   Social History  . Marital status: Married    Spouse name: N/A  . Number of children: 1  . Years of education: N/A   Occupational History  . [employer]   Social History Main Topics  . Smoking status: Never Smoker  . Smokeless tobacco: Never Used  . Alcohol use 0.0 oz/week     Comment: ocassional-2-3 weekly  . Drug use: No  . Sexual activity: Yes    Partners: Male   Other Topics Concern  . Not on file   Social History Narrative   Work or School: Psychologist, forensic  degree, applying for PA school in [place] Situation: lives with husband and son 19, step daughter 12 and 21      Spiritual Beliefs: Christian      Lifestyle: dance videos; diet is good       Wears seat belt regularly       Past Surgical History:  Procedure Laterality Date  . APPENDECTOMY  2006   Enlarged  . BREAST ENHANCEMENT SURGERY  2008  . Cheek bones corrected     at 36 yo  . RHINOPLASTY     at 36 yo  . UPPER GASTROINTESTINAL ENDOSCOPY      Family History  Problem Relation Age of Onset  . Heart disease Mother   . Hyperlipidemia Mother   .  Rheum arthritis Mother   . Fibroids Mother     Uterine  . Fibrocystic breast disease Mother   . Other Mother     gastritis  . Hypertension Mother   . Hyperlipidemia Maternal Grandmother   . Osteoarthritis Maternal Grandmother   . Glaucoma Maternal Grandmother   . Kidney disease Maternal Grandmother   . Cancer Maternal Grandfather     Skin, prostate, throat, stomach  . Colon cancer Maternal Grandfather   . Other Father     gastritis  . Heart disease Father     arteriosclerosis  . Arthritis Father     femoral head necrosis b/l  . Dementia Paternal Grandmother   . Heart disease Paternal Uncle   . Breast cancer Maternal Aunt     Allergies  Allergen Reactions  . Azithromycin     gastritis  . Iodinated Diagnostic Agents Hives    Pt developed 2 hives around site of injection Lt antecubital that were itchy after leaving lobby post scan. Given 50 mg oral benadryl.     Current Outpatient Prescriptions on File Prior to Visit  Medication Sig Dispense Refill  . acyclovir (ZOVIRAX) 800 MG tablet Take 1 tablet (800 mg total) by mouth 3 (three) times daily. (Patient taking differently: Take 800 mg by mouth daily as needed (outbreak). ) 21 tablet 1  . aspirin-acetaminophen-caffeine (EXCEDRIN MIGRAINE) 836-629-47 MG per tablet Take 1 tablet by mouth every 6 (six) hours as needed. For migraine    . cyanocobalamin (,VITAMIN B-12,) 1000 MCG/ML injection Inject 1 mL (1,000 mcg total) into the muscle every 30 (thirty) days. 30 mL 6  . diphenhydrAMINE (BENADRYL) 25 MG tablet Take 25 mg by mouth at bedtime as needed. For allergies    . hyoscyamine (LEVSIN SL) 0.125 MG SL tablet PLACE 1-2 TABLETS UNDER TONGUE EVERY 4 HOURS DAILY AS NEEDED FOR PAIN 30 tablet 6  . loratadine (CLARITIN) 10 MG tablet Take 1 tablet (10 mg total) by mouth 2 (two) times daily as needed for allergies. 60 tablet   . OXcarbazepine ER 300 MG TB24 Take 300 mg by mouth daily. 90 tablet 3  . pantoprazole (PROTONIX) 40 MG tablet  TAKE 1 TABLET BY MOUTH EVERY DAY 90 tablet 1  . SYRINGE-NEEDLE, DISP, 3 ML 25G X 1" 3 ML MISC Use with vitamin B 12 injections 50 each 1   No current facility-administered medications on file prior to visit.     BP 115/71 (BP Location: Right Arm, Patient Position: Sitting, Cuff Size: Normal)   Pulse 65   Temp 98 F (36.7 C) (Oral)   Resp 16   Ht _0  (1.499 m)   Wt 112 lb 9.6 oz (51.1 kg)  SpO2 100%   BMI 22.74 kg/m       Objective:   Physical Exam  General Appearance- Not in acute distress.  HEENT Eyes- Scleraeral/Conjuntiva-bilat- Not Yellow. Mouth & Throat- Normal.  Chest and Lung Exam Auscultation: Breath sounds:-Normal. Adventitious sounds:- No Adventitious sounds.  Cardiovascular Auscultation:Rythm - Regular. Heart Sounds -Normal heart sounds.  Abdomen Inspection:-Inspection Normal.  Palpation/Perucssion: Palpation and Percussion of the abdomen reveal- Non Tender, No Rebound tenderness, No rigidity(Guarding) and No Palpable abdominal masses.  Liver:-Normal.  Spleen:- Normal.   Rectal On inspection patient has 2 small non thrombosed hemorrhoids 5 oclock and 7 oclock position. No evidence of thrombosed hemorrhoid..      Assessment & Plan:  For severe diarrhea will advise bland diet, hydrate with propel and use lomotil.  Stool panel kit turn in today if possible.  For hemorrhoids would advise use suppository. Expect when diarrhea is better controlled hemorrhoids will flare down. If hemorrhoid pain persists would consider referral to specialist.  Later remembered that intended to print lomotil rx for pt. She may have to use immodium ad.   Follow up in 7 days or as needed  Will try to call her pharmacy and see if I can call med in. But verify first with pt where she wants me to send.  I tried to call pharmacy after hours around 10 pm. Pharmacy was closed.  Assunta Pupo, Percell Miller, PA-C

## 2016-10-19 NOTE — Patient Instructions (Addendum)
For severe diarrhea will advise bland diet, hydrate with propel and use lomotil.  Stool panel kit turn in today if possible.  For hemorrhoids would advise use suppository. Expect when diarrhea is better controlled hemorrhoids will flare down. If hemorrhoid pain persists would consider referral to specialist.  Later remembered that intended to print lomotil rx for pt. She may have to use immodium ad.   Follow up in 7 days or as needed

## 2016-10-19 NOTE — Progress Notes (Signed)
Pre visit review using our clinic review tool, if applicable. No additional management support is needed unless otherwise documented below in the visit note. 

## 2016-11-12 ENCOUNTER — Other Ambulatory Visit: Payer: Self-pay | Admitting: Internal Medicine

## 2016-11-12 ENCOUNTER — Telehealth: Payer: Self-pay | Admitting: Internal Medicine

## 2016-11-12 MED ORDER — HYOSCYAMINE SULFATE 0.125 MG SL SUBL: SUBLINGUAL_TABLET | SUBLINGUAL | 3 refills | Status: DC

## 2016-11-12 NOTE — Telephone Encounter (Signed)
Patient has scheduled an office visit so refilled Levsin

## 2016-11-16 ENCOUNTER — Ambulatory Visit (INDEPENDENT_AMBULATORY_CARE_PROVIDER_SITE_OTHER): Payer: 59 | Admitting: Family Medicine

## 2016-11-16 ENCOUNTER — Encounter: Payer: Self-pay | Admitting: Family Medicine

## 2016-11-16 ENCOUNTER — Ambulatory Visit (HOSPITAL_BASED_OUTPATIENT_CLINIC_OR_DEPARTMENT_OTHER)
Admission: RE | Admit: 2016-11-16 | Discharge: 2016-11-16 | Disposition: A | Discharge status: Home / Self Care | Payer: 59 | Source: Ambulatory Visit | Attending: Family Medicine | Admitting: Family Medicine

## 2016-11-16 VITALS — BP 98/68 | HR 71 | Temp 98.4°F | Resp 18 | Wt 115.8 lb

## 2016-11-16 DIAGNOSIS — N912 Amenorrhea, unspecified: Secondary | ICD-10-CM

## 2016-11-16 DIAGNOSIS — R1011 Right upper quadrant pain: Secondary | ICD-10-CM | POA: Diagnosis not present

## 2016-11-16 DIAGNOSIS — E782 Mixed hyperlipidemia: Secondary | ICD-10-CM

## 2016-11-16 DIAGNOSIS — K219 Gastro-esophageal reflux disease without esophagitis: Secondary | ICD-10-CM | POA: Diagnosis not present

## 2016-11-16 DIAGNOSIS — K760 Fatty (change of) liver, not elsewhere classified: Secondary | ICD-10-CM | POA: Diagnosis not present

## 2016-11-16 DIAGNOSIS — R109 Unspecified abdominal pain: Secondary | ICD-10-CM

## 2016-11-16 DIAGNOSIS — N3000 Acute cystitis without hematuria: Secondary | ICD-10-CM

## 2016-11-16 HISTORY — DX: Mixed hyperlipidemia: E78.2

## 2016-11-16 HISTORY — DX: Unspecified abdominal pain: R10.9

## 2016-11-16 LAB — LIPID PANEL
CHOL/HDL RATIO: 4
Cholesterol: 187 mg/dL (ref 0–200)
HDL: 46.6 mg/dL (ref >39.00)
NonHDL: 140.13
Triglycerides: 240 mg/dL — ABNORMAL HIGH (ref 0.0–149.0)
VLDL: 48 mg/dL — AB (ref 0.0–40.0)

## 2016-11-16 LAB — SEDIMENTATION RATE: SED RATE: 6 mm/h (ref 0–20)

## 2016-11-16 LAB — CBC WITH DIFFERENTIAL/PLATELET
Basophils Absolute: 0.1 10*3/uL (ref 0.0–0.1)
Basophils Relative: 0.8 % (ref 0.0–3.0)
EOS PCT: 3.2 % (ref 0.0–5.0)
Eosinophils Absolute: 0.3 10*3/uL (ref 0.0–0.7)
HEMATOCRIT: 40.5 % (ref 36.0–46.0)
Hemoglobin: 13.4 g/dL (ref 12.0–15.0)
Lymphocytes Relative: 26.9 % (ref 12.0–46.0)
Lymphs Abs: 2.2 10*3/uL (ref 0.7–4.0)
MCHC: 33 g/dL (ref 30.0–36.0)
MCV: 88.8 fl (ref 78.0–100.0)
Monocytes Absolute: 0.5 10*3/uL (ref 0.1–1.0)
Monocytes Relative: 6.2 % (ref 3.0–12.0)
NEUTROS PCT: 62.9 % (ref 43.0–77.0)
Neutro Abs: 5.2 10*3/uL (ref 1.4–7.7)
PLATELETS: 336 10*3/uL (ref 150.0–400.0)
RBC: 4.56 Mil/uL (ref 3.87–5.11)
RDW: 13.5 % (ref 11.5–15.5)
WBC: 8.3 10*3/uL (ref 4.0–10.5)

## 2016-11-16 LAB — COMPREHENSIVE METABOLIC PANEL
ALK PHOS: 71 U/L (ref 39–117)
ALT: 37 U/L — ABNORMAL HIGH (ref 0–35)
AST: 24 U/L (ref 0–37)
Albumin: 4.2 g/dL (ref 3.5–5.2)
BUN: 18 mg/dL (ref 6–23)
CALCIUM: 9.4 mg/dL (ref 8.4–10.5)
CO2: 28 mEq/L (ref 19–32)
Chloride: 104 mEq/L (ref 96–112)
Creatinine, Ser: 0.7 mg/dL (ref 0.40–1.20)
GFR: 101.02 mL/min (ref >60.00)
GLUCOSE: 86 mg/dL (ref 70–99)
POTASSIUM: 4 meq/L (ref 3.5–5.1)
Sodium: 139 mEq/L (ref 135–145)
Total Bilirubin: 0.5 mg/dL (ref 0.2–1.2)
Total Protein: 7.6 g/dL (ref 6.0–8.3)

## 2016-11-16 LAB — URINALYSIS
BILIRUBIN URINE: NEGATIVE
HGB URINE DIPSTICK: NEGATIVE
KETONES UR: NEGATIVE
LEUKOCYTES UA: NEGATIVE
Nitrite: NEGATIVE
SPECIFIC GRAVITY, URINE: 1.025 (ref 1.000–1.030)
Total Protein, Urine: NEGATIVE
Urine Glucose: NEGATIVE
Urobilinogen, UA: 0.2 (ref 0.0–1.0)
pH: 6 (ref 5.0–8.0)

## 2016-11-16 LAB — PREGNANCY, URINE: Preg Test, Ur: NEGATIVE

## 2016-11-16 LAB — LDL CHOLESTEROL, DIRECT: LDL DIRECT: 102 mg/dL

## 2016-11-16 NOTE — Progress Notes (Signed)
Subjective:  I acted as a Education administrator for Dr. Charlett Blake. [place], [place]  Patient ID: Danielle Sims, female    DOB: 1980/12/11, 36 y.o.   MRN: [id number]  No chief complaint on file.   HPI  Patient is in today for an acute visit with an abdominal lump. She stated she has IBS and noticed the lump after her episode. She has been able to eat, and denies vomiting. There was some loose stool and nausea but these symptoms have improved. No fevers or chills. No malaise or myalgias. Denies CP/palp/SOB/HA/congestion/fevers or GU c/o. Taking meds as prescribed  Patient Care Team: Mosie Lukes, MD as PCP - General (Family Medicine)   Past Medical History:  Diagnosis Date  . Abnormal thyroid function test 03/15/2013   once, all normal since  . Allergic rhinitis 07/13/2011  . Anxiety attack 11/13/2011  . Bell's palsy 07/13/2011   Hx of, used to see Dr. Jacelyn Grip  . Chest pain, atypical 11/29/2011  . Fatigue 11/11/2015  . Fatty infiltration of liver 07/17/2015  . Folliculitis 4/88/8916  . GERD (gastroesophageal reflux disease) 05/31/2011   and nausea, sees Alonza Bogus in [place]  . Gonorrhea 02/08/2013  . History of Meniere's disease 09/14/2011  . 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 Hyperlipidemia, mixed 11/16/2016  . IBS (irritable bowel syndrome) 05/31/2011   sees Alonza Bogus in GI - hx rectal bleeding  . Left hip pain 11/11/2015   Has a history of left hip s/p an injury 6 years ago. Wearing heals twisted ankle in a whole and the next day her hip started hurting and has bothered her off and on since then.   . Migraines 07/13/2011  . Optic neuritis 07/12/2011   sees opthomologist  . Other and unspecified ovarian cysts   . Pain in the abdomen 11/16/2016  . 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 Preventative health care 11/11/2015  . Urticaria 01/11/2012  . UTI (urinary tract infection) 11/11/2015  . Vaginitis and vulvovaginitis 11/11/2015    Past Surgical History:  Procedure Laterality Date  . APPENDECTOMY  2006   Enlarged  .  BREAST ENHANCEMENT SURGERY  2008  . Cheek bones corrected     at 36 yo  . RHINOPLASTY     at 36 yo  . UPPER GASTROINTESTINAL ENDOSCOPY      Family History  Problem Relation Age of Onset  . Heart disease Mother   . Hyperlipidemia Mother   . Rheum arthritis Mother   . Fibroids Mother        Uterine  . Fibrocystic breast disease Mother   . Other Mother        gastritis  . Hypertension Mother   . Hyperlipidemia Maternal Grandmother   . Osteoarthritis Maternal Grandmother   . Glaucoma Maternal Grandmother   . Kidney disease Maternal Grandmother   . Cancer Maternal Grandfather        Skin, prostate, throat, stomach  . Colon cancer Maternal Grandfather   . Other Father        gastritis  . Heart disease Father        arteriosclerosis  . Arthritis Father        femoral head necrosis b/l  . Dementia Paternal Grandmother   . Heart disease Paternal Uncle   . Breast cancer Maternal Aunt     Social History   Social History  . Marital status: Married    Spouse name: N/A  . Number of children: 1  . Years of education: N/A   Occupational  History  . [employer]   Social History Main Topics  . Smoking status: Never Smoker  . Smokeless tobacco: Never Used  . Alcohol use 0.0 oz/week     Comment: ocassional-2-3 weekly  . Drug use: No  . Sexual activity: Yes    Partners: Male   Other Topics Concern  . 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 acyclovir (ZOVIRAX) 800 MG tablet Take 1 tablet (800 mg total) by mouth 3 (three) times daily. (Patient taking differently: Take 800 mg by mouth daily as needed (outbreak).  ) 21 tablet 1  . aspirin-acetaminophen-caffeine (EXCEDRIN MIGRAINE) 233-007-62 MG per tablet Take 1 tablet by mouth every 6 (six) hours as needed. For migraine    . cyanocobalamin (,VITAMIN B-12,) 1000 MCG/ML injection Inject 1 mL (1,000 mcg total) into the muscle every 30 (thirty) days. 30 mL 6  . diphenhydrAMINE (BENADRYL) 25 MG tablet Take 25 mg by mouth at bedtime as needed. For allergies    . diphenoxylate-atropine (LOMOTIL) 2.5-0.025 MG tablet Take 2 tablets by mouth 4 (four) times daily as needed for diarrhea or loose stools. 16 tablet 0  . hyoscyamine (LEVSIN SL) 0.125 MG SL tablet PLACE 1-2 TABLETS UNDER TONGUE EVERY 4 HOURS DAILY AS NEEDED FOR PAIN 30 tablet 3  . loratadine (CLARITIN) 10 MG tablet Take 1 tablet (10 mg total) by mouth 2 (two) times daily as needed for allergies. 60 tablet   . OXcarbazepine ER 300 MG TB24 Take 300 mg by mouth daily. 90 tablet 3  . pantoprazole (PROTONIX) 40 MG tablet TAKE 1 TABLET BY MOUTH EVERY DAY 90 tablet 1  . SYRINGE-NEEDLE, DISP, 3 ML 25G X 1" 3 ML MISC Use with vitamin B 12 injections 50 each 1   No facility-administered medications prior to visit.     Allergies  Allergen Reactions  . Azithromycin     gastritis  . Iodinated Diagnostic Agents Hives    Pt developed 2 hives around site of injection Lt antecubital that were itchy after leaving lobby post scan. Given 50 mg oral benadryl.     Review of Systems  Constitutional: Negative for fever and malaise/fatigue.  HENT: Negative for congestion.   Eyes: Negative for blurred vision.  Respiratory: Negative for cough and shortness of breath.   Cardiovascular: Positive for orthopnea. Negative for chest pain, palpitations and leg swelling.  Gastrointestinal: Positive for abdominal pain and diarrhea. Negative for vomiting.  Musculoskeletal: Negative for back pain.  Skin: Negative for rash.  Neurological: Negative for loss of consciousness and headaches.       Objective:    Physical Exam    Constitutional: She is oriented to person, place, and time. She appears well-developed and well-nourished. No distress.  HENT:  Head: Normocephalic and atraumatic.  Eyes: Conjunctivae are normal.  Neck: Normal range of motion. No thyromegaly present.  Cardiovascular: Normal rate and regular rhythm.   Pulmonary/Chest: Effort normal and breath sounds normal. She has no wheezes.  Abdominal: Soft. Bowel sounds are normal. There is tenderness.  SQ LN RUQ, tender to palpation.   Musculoskeletal: Normal range of motion. She exhibits no edema or deformity.  Neurological: She is alert and oriented to person, place, and time.  Skin: Skin is warm and dry. She is not diaphoretic.  Psychiatric: She has a normal mood and affect.    BP 98/68 (BP Location: Left Arm, Patient Position: Sitting, Cuff Size: Normal)   Pulse 71   Temp 98.4 F (36.9 C) (Oral)   Resp 18   Wt 115 lb 12.8 oz (52.5 kg)   SpO2 98%   BMI 23.39 kg/m  Wt Readings from Last 3 Encounters:  11/16/16 115 lb 12.8 oz (52.5 kg)  10/19/16 112 lb 9.6 oz (51.1 kg)  09/24/16 114 lb 3.2 oz (51.8 kg)   BP Readings from Last 3 Encounters:  11/16/16 98/68  10/19/16 115/71  09/24/16 114/74     Immunization History  Administered Date(s) Administered  . DTaP 10/24/1981, 01/23/1982, 04/22/1982, 04/04/2004  . Hepatitis B 02/29/2004, 04/04/2004  . Hepatitis B, adult 09/27/2015  . IPV 10/24/1981, 01/23/1982, 04/22/1982  . Influenza,inj,Quad PF,36+ Mos 09/08/2015, 04/25/2016  . MMR 02/29/2004, 04/04/2004  . PPD Test 01/08/2012  . Td 07/02/2008  . Varicella 02/29/2004, 04/04/2004    Health Maintenance  Topic Date Due  . INFLUENZA VACCINE  01/30/2017  . PAP SMEAR  10/11/2017  . TETANUS/TDAP  07/02/2018  . HIV Screening  Completed    Lab Results  Component Value Date   WBC 8.3 11/16/2016   HGB 13.4 11/16/2016   HCT 40.5 11/16/2016   PLT 336.0 11/16/2016   GLUCOSE 86 11/16/2016   CHOL 187 11/16/2016   TRIG 240.0 (H)  11/16/2016   HDL 46.60 11/16/2016   LDLDIRECT 102.0 11/16/2016   LDLCALC 111 (H) 10/12/2014   ALT 37 (H) 11/16/2016   AST 24 11/16/2016   NA 139 11/16/2016   K 4.0 11/16/2016   CL 104 11/16/2016   CREATININE 0.70 11/16/2016   BUN 18 11/16/2016   CO2 28 11/16/2016   TSH 3.19 09/24/2016   HGBA1C 5.8 10/12/2014    Lab Results  Component Value Date   TSH 3.19 09/24/2016   Lab Results  Component Value Date   WBC 8.3 11/16/2016   HGB 13.4 11/16/2016   HCT 40.5 11/16/2016   MCV 88.8 11/16/2016   PLT 336.0 11/16/2016   Lab Results  Component Value Date   NA 139 11/16/2016   K 4.0 11/16/2016   CO2 28 11/16/2016   GLUCOSE 86 11/16/2016   BUN 18 11/16/2016   CREATININE 0.70 11/16/2016   BILITOT 0.5 11/16/2016   ALKPHOS 71 11/16/2016   AST 24 11/16/2016   ALT 37 (H) 11/16/2016   PROT 7.6 11/16/2016   ALBUMIN 4.2 11/16/2016   CALCIUM 9.4 11/16/2016   ANIONGAP 12 06/05/2015   GFR 101.02 11/16/2016   Lab Results  Component Value Date   CHOL 187 11/16/2016   Lab Results  Component Value Date   HDL 46.60 11/16/2016   Lab Results  Component Value Date   LDLCALC 111 (H) 10/12/2014   Lab Results  Component Value Date   TRIG 240.0 (H) 11/16/2016   Lab Results  Component Value Date   CHOLHDL 4 11/16/2016   Lab Results  Component Value Date   HGBA1C 5.8 10/12/2014         Assessment & Plan:   Problem List Items Addressed This Visit    GERD    Avoid offending foods, start probiotics. Do not eat large  meals in late evening and consider raising head of bed.       Fatty infiltration of liver    Ultrasound confirms ongoing fatty liver disease. Minimize simple carbohydrates and fast food.       Relevant Orders   Lipid panel (Completed)   US Abdomen Complete (Completed)   UTI (urinary tract infection) - Primary   Relevant Orders   Urine culture (Completed)   Urinalysis (Completed)   Pain in the abdomen    Small, 1 cm SQ LN in LUQ ultrasound shows no  concerning lesion, she is to report if it grows or pain increases. last week episode of nausea, diarrhea has resolved. She has missed her cycle which is unusual for her but pregnancy is negative and she is in [place] school under a great deal of stress.       Relevant Orders   CBC with Differential/Platelet (Completed)   Comprehensive metabolic panel (Completed)   Sed Rate (ESR) (Completed)   US Abdomen Complete (Completed)   Hyperlipidemia, mixed    Encouraged heart healthy diet, increase exercise, avoid trans fats, consider a krill oil cap daily       Other Visit Diagnoses    Amenorrhea       Relevant Orders   Pregnancy, urine (Completed)      I am having Ms. Totty maintain her aspirin-acetaminophen-caffeine, diphenhydrAMINE, acyclovir, OXcarbazepine ER, loratadine, pantoprazole, SYRINGE-NEEDLE (DISP) 3 ML, cyanocobalamin, diphenoxylate-atropine, and hyoscyamine.  No orders of the defined types were placed in this encounter.   CMA served as Education administrator during this visit. History, Physical and Plan performed by medical provider. Documentation and orders reviewed and attested to.  Penni Homans, MD

## 2016-11-16 NOTE — Patient Instructions (Signed)
Nonalcoholic Fatty Liver Disease Diet Nonalcoholic fatty liver disease is a condition that causes fat to accumulate in and around the liver. The disease makes it harder for the liver to work the way that it should. Following a healthy diet can help to keep nonalcoholic fatty liver disease under control. It can also help to prevent or improve conditions that are associated with the disease, such as heart disease, diabetes, high blood pressure, and abnormal cholesterol levels. Along with regular exercise, this diet:  Promotes weight loss.  Helps to control blood sugar levels.  Helps to improve the way that the body uses insulin. What do I need to know about this diet?  Use the glycemic index (GI) to plan your meals. The index tells you how quickly a food will raise your blood sugar. Choose low-GI foods. These foods take a longer time to raise blood sugar.  Keep track of how many calories you take in. Eating the right amount of calories will help you to achieve a healthy weight.  You may want to follow a Mediterranean diet. This diet includes a lot of vegetables, lean meats or fish, whole grains, fruits, and healthy oils and fats. What foods can I eat? Grains  Whole grains, such as whole-wheat or whole-grain breads, crackers, tortillas, cereals, and pasta. Stone-ground whole wheat. Pumpernickel bread. Unsweetened oatmeal. Bulgur. Barley. Quinoa. Brown or wild rice. Corn or whole-wheat flour tortillas. Vegetables  Lettuce. Spinach. Peas. Beets. Cauliflower. Cabbage. Broccoli. Carrots. Tomatoes. Squash. Eggplant. Herbs. Peppers. Onions. Cucumbers. Brussels sprouts. Yams and sweet potatoes. Beans. Lentils. Fruits  Bananas. Apples. Oranges. Grapes. Papaya. Mango. Pomegranate. Kiwi. Grapefruit. Cherries. Meats and Other Protein Sources  Seafood and shellfish. Lean meats. Poultry. Tofu. Dairy  Low-fat or fat-free dairy products, such as yogurt, cottage cheese, and cheese. Beverages  Water.  Sugar-free drinks. Tea. Coffee. Low-fat or skim milk. Milk alternatives, such as soy or almond milk. Real fruit juice. Condiments  Mustard. Relish. Low-fat, low-sugar ketchup and barbecue sauce. Low-fat or fat-free mayonnaise. Sweets and Desserts  Sugar-free sweets. Fats and Oils  Avocado. Canola or olive oil. Nuts and nut butters. Seeds. The items listed above may not be a complete list of recommended foods or beverages. Contact your dietitian for more options.  What foods are not recommended? Palm oil and coconut oil. Processed foods. Fried foods. Sweetened drinks, such as sweet tea, milkshakes, snow cones, iced sweet drinks, and sodas. Alcohol. Sweets. Foods that contain a lot of salt or sodium. The items listed above may not be a complete list of foods and beverages to avoid. Contact your dietitian for more information.  This information is not intended to replace advice given to you by your health care provider. Make sure you discuss any questions you have with your health care provider. Document Released: 11/02/2014 Document Revised: 11/24/2015 Document Reviewed: 07/13/2014 Elsevier Interactive Patient Education  2017 [employer].

## 2016-11-17 LAB — URINE CULTURE: ORGANISM ID, BACTERIA: NO GROWTH

## 2016-11-18 NOTE — Assessment & Plan Note (Signed)
Ultrasound confirms ongoing fatty liver disease. Minimize simple carbohydrates and fast food.

## 2016-11-18 NOTE — Assessment & Plan Note (Signed)
Encouraged heart healthy diet, increase exercise, avoid trans fats, consider a krill oil cap daily 

## 2016-11-18 NOTE — Assessment & Plan Note (Signed)
Avoid offending foods, start probiotics. Do not eat large meals in late evening and consider raising head of bed.  

## 2016-11-18 NOTE — Assessment & Plan Note (Addendum)
Small, 1 cm SQ LN in LUQ ultrasound shows no concerning lesion, she is to report if it grows or pain increases. last week episode of nausea, diarrhea has resolved. She has missed her cycle which is unusual for her but pregnancy is negative and she is in [place] school under a great deal of stress.

## 2016-12-07 ENCOUNTER — Ambulatory Visit: Payer: 59 | Admitting: Internal Medicine

## 2016-12-31 ENCOUNTER — Ambulatory Visit (INDEPENDENT_AMBULATORY_CARE_PROVIDER_SITE_OTHER): Payer: 59 | Admitting: Internal Medicine

## 2016-12-31 ENCOUNTER — Encounter: Payer: Self-pay | Admitting: Internal Medicine

## 2016-12-31 VITALS — BP 106/64 | HR 64 | Ht 59.0 in | Wt 116.6 lb

## 2016-12-31 DIAGNOSIS — K649 Unspecified hemorrhoids: Secondary | ICD-10-CM | POA: Diagnosis not present

## 2016-12-31 DIAGNOSIS — K580 Irritable bowel syndrome with diarrhea: Secondary | ICD-10-CM | POA: Diagnosis not present

## 2016-12-31 MED ORDER — DICYCLOMINE HCL 10 MG PO CAPS: 10.0000 mg | ORAL_CAPSULE | Freq: Three times a day (TID) | ORAL | 3 refills | Status: DC | PRN

## 2016-12-31 MED ORDER — HYDROCORTISONE ACETATE 25 MG RE SUPP: 25.0000 mg | Freq: Every day | RECTAL | 3 refills | Status: DC

## 2016-12-31 NOTE — Progress Notes (Signed)
HISTORY OF PRESENT ILLNESS:  Danielle Sims is a 36 y.o. female, native of [place] and current Astronomer at [employer], with a history of GERD, IBS, and hemorrhoids who presents today for follow-up with chief complaints of worsening IBS and symptomatic hemorrhoids. She has been evaluated in this office on several occasions. Last evaluation with GI physician assistant January 2016. Testing for H. pylori was negative. Previous upper endoscopy August 2014 was normal. Patient tells me that she has been having worsening problems with her irritable bowel syndrome. More frequent in terms of cramping followed by urgency and loose bowel movements. The majority of the time this is postprandially. The pain is in the lower abdomen. Most part improved post defecation though left with fatigue and soreness in the lower abdomen. Occurring now once or twice per week. Levsin sublingual has helped in the past but less so recently. She is also noticed some blood from hemorrhoidal irritation with increased frequency of bowel movements. Stress makes things worse. No nocturnal component. She feels this is affecting her quality of life and social life. Occasional constipation  REVIEW OF SYSTEMS:  All non-GI ROS negative less otherwise stated in the history of present illness  Past Medical History:  Diagnosis Date  . Abnormal thyroid function test 03/15/2013   once, all normal since  . Allergic rhinitis 07/13/2011  . Anxiety attack 11/13/2011  . Bell's palsy 07/13/2011   Hx of, used to see Dr. Jacelyn Grip  . Chest pain, atypical 11/29/2011  . Fatigue 11/11/2015  . Fatty infiltration of liver 07/17/2015  . Folliculitis 4/48/1856  . GERD (gastroesophageal reflux disease) 05/31/2011   and nausea, sees Alonza Bogus in [place]  . Gonorrhea 02/08/2013  . History of Meniere's disease 09/14/2011  . 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 Hyperlipidemia, mixed 11/16/2016  . IBS (irritable bowel syndrome) 05/31/2011    sees Alonza Bogus in GI - hx rectal bleeding  . Left hip pain 11/11/2015   Has a history of left hip s/p an injury 6 years ago. Wearing heals twisted ankle in a whole and the next day her hip started hurting and has bothered her off and on since then.   . Migraines 07/13/2011  . Optic neuritis 07/12/2011   sees opthomologist  . Other and unspecified ovarian cysts   . Pain in the abdomen 11/16/2016  . 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 Preventative health care 11/11/2015  . Urticaria 01/11/2012  . UTI (urinary tract infection) 11/11/2015  . Vaginitis and vulvovaginitis 11/11/2015    Past Surgical History:  Procedure Laterality Date  . APPENDECTOMY  2006   Enlarged  . BREAST ENHANCEMENT SURGERY  2008  . Cheek bones corrected     at 36 yo  . RHINOPLASTY     at 36 yo  . UPPER GASTROINTESTINAL ENDOSCOPY      Social History Nicole Defino  reports that she has never smoked. She has never used smokeless tobacco. She reports that she drinks alcohol. She reports that she does not use drugs.  family history includes Arthritis in her father; Breast cancer in her maternal aunt; Colon cancer in her maternal grandfather; Dementia in her paternal grandmother; Fibrocystic breast disease in her mother; Fibroids in her mother; Glaucoma in her maternal grandmother; Heart disease in her father, mother, and paternal uncle; Hyperlipidemia in her maternal grandmother and mother; Hypertension in her mother; Kidney disease in her maternal grandmother; Osteoarthritis in her maternal grandmother; Other in her father and mother; Prostate cancer in her  maternal grandfather; Rheum arthritis in her mother; Skin cancer in her maternal grandfather; Stomach cancer in her maternal grandfather; Throat cancer in her maternal grandfather.  Allergies  Allergen Reactions  . Azithromycin     gastritis  . Iodinated Diagnostic Agents Hives    Pt developed 2 hives around site of injection Lt antecubital that were itchy after leaving lobby  post scan. Given 50 mg oral benadryl.        PHYSICAL EXAMINATION: Vital signs: BP 106/64   Pulse 64   Ht 4\' 11"  (1.499 m)   Wt 116 lb 9.6 oz (52.9 kg)   BMI 23.55 kg/m   Constitutional: generally well-appearing, no acute distress Psychiatric: alert and oriented x3, cooperative Eyes: extraocular movements intact, anicteric, conjunctiva pink Mouth: oral pharynx moist, no lesions Neck: supple no lymphadenopathy Cardiovascular: heart regular rate and rhythm, no murmur Lungs: clear to auscultation bilaterally Abdomen: soft, nontender, nondistended, no obvious ascites, no peritoneal signs, normal bowel sounds, no organomegaly Rectal:Omitted Extremities: no clubbing cyanosis or lower extremity edema bilaterally Skin: no lesions on visible extremities Neuro: No focal deficits. Cranor is intact  ASSESSMENT:  #1. Irritable bowel syndrome. Deteriorated #2. Symptomatic hemorrhoids. History of #3. GERD. Symptoms controlled on pantoprazole   PLAN:  #1. Metamucil 2 tablespoons daily. Pick up OTC #2. Bentyl 20 mg by mouth 3 times a day when necessary. Prescribed #3. Anusol HC suppositories at night. Prescribed #4. Reflux precautions #5. The PPI to control GERD symptoms. Lowest dose at lowest frequency to control symptoms #6. Routine GI follow-up one year. Sooner if needed

## 2016-12-31 NOTE — Patient Instructions (Signed)
We have sent the following medications to your pharmacy for you to pick up at your convenience:  Bentyl, Anusol HC suppositories  You may take 2 tablespoons of Metamucil daily

## 2017-01-17 ENCOUNTER — Telehealth: Payer: Self-pay | Admitting: Family Medicine

## 2017-01-17 NOTE — Telephone Encounter (Signed)
lvm for pt to call back to schedule nurse visit for TB blood draw.

## 2017-01-17 NOTE — Telephone Encounter (Signed)
Pt called because she need the TB blood draw. She said that its needed for school.   Please advise for scheduling.    CB: [phone]

## 2017-01-17 NOTE — Telephone Encounter (Signed)
Patient can be schedule for TB test she can be placed on nurse schedule for Monday.    PC

## 2017-01-23 NOTE — Telephone Encounter (Addendum)
°  Relation to TY:YPEJ Call back number:[phone]  D.O.D Dr. Larose Kells   Reason for call:  Patient returned call and would like to schedule TDAP booster with nurse for Friday with TB lab draw, please advise regarding TDAP booster.

## 2017-01-23 NOTE — Telephone Encounter (Signed)
Patient scheduled with nurse for 01/25/2017 and labs directly after for TB blood draw

## 2017-01-23 NOTE — Telephone Encounter (Addendum)
Spoke w/ Pt, she is needing TDAP for college, she is stating clinical rounds, okay to schedule nurse visit at her convenience. Thank you.

## 2017-01-25 ENCOUNTER — Ambulatory Visit (INDEPENDENT_AMBULATORY_CARE_PROVIDER_SITE_OTHER): Payer: 59 | Admitting: Behavioral Health

## 2017-01-25 ENCOUNTER — Other Ambulatory Visit: Payer: 59

## 2017-01-25 DIAGNOSIS — R7611 Nonspecific reaction to tuberculin skin test without active tuberculosis: Secondary | ICD-10-CM | POA: Diagnosis not present

## 2017-01-25 DIAGNOSIS — Z23 Encounter for immunization: Secondary | ICD-10-CM

## 2017-01-25 NOTE — Progress Notes (Signed)
Pre visit review using our clinic review tool, if applicable. No additional management support is needed unless otherwise documented below in the visit note.  Patient came in clinic today for Tdap vaccination & Quantiferon TB Gold lab work. RN received verbal orders from Dr. Charlett Blake for both the vaccine & lab. IM injection was given in the right deltoid. Patient tolerated injection well. She did not have any concerns before leaving the nurse visit & went to the lab afterwards.

## 2017-01-26 LAB — QUANTIFERON TB GOLD ASSAY (BLOOD)
INTERFERON GAMMA RELEASE ASSAY: NEGATIVE
QUANTIFERON NIL VALUE: 0.05 [IU]/mL
Quantiferon Tb Ag Minus Nil Value: 0.17 IU/mL

## 2017-01-29 ENCOUNTER — Other Ambulatory Visit: Payer: 59

## 2017-02-28 ENCOUNTER — Other Ambulatory Visit: Payer: Self-pay | Admitting: Internal Medicine

## 2017-04-22 ENCOUNTER — Other Ambulatory Visit: Payer: Self-pay

## 2017-04-22 MED ORDER — DICYCLOMINE HCL 10 MG PO CAPS: 10.0000 mg | ORAL_CAPSULE | Freq: Three times a day (TID) | ORAL | 1 refills | Status: DC | PRN

## 2017-05-07 ENCOUNTER — Other Ambulatory Visit: Payer: Self-pay

## 2017-05-07 MED ORDER — DICYCLOMINE HCL 10 MG PO CAPS: 10.0000 mg | ORAL_CAPSULE | Freq: Three times a day (TID) | ORAL | 1 refills | Status: AC | PRN

## 2017-07-08 ENCOUNTER — Ambulatory Visit: Payer: 59 | Admitting: Family Medicine

## 2017-07-08 ENCOUNTER — Encounter: Payer: Self-pay | Admitting: Family Medicine

## 2017-07-08 VITALS — BP 98/68 | HR 73 | Temp 98.3°F | Resp 18 | Wt 120.6 lb

## 2017-07-08 DIAGNOSIS — W57XXXA Bitten or stung by nonvenomous insect and other nonvenomous arthropods, initial encounter: Secondary | ICD-10-CM | POA: Diagnosis not present

## 2017-07-08 DIAGNOSIS — S50862A Insect bite (nonvenomous) of left forearm, initial encounter: Secondary | ICD-10-CM

## 2017-07-08 DIAGNOSIS — B009 Herpesviral infection, unspecified: Secondary | ICD-10-CM | POA: Diagnosis not present

## 2017-07-08 DIAGNOSIS — G510 Bell's palsy: Secondary | ICD-10-CM

## 2017-07-08 DIAGNOSIS — R21 Rash and other nonspecific skin eruption: Secondary | ICD-10-CM

## 2017-07-08 MED ORDER — ACYCLOVIR 800 MG PO TABS: ORAL_TABLET | ORAL | 1 refills | Status: DC

## 2017-07-08 MED ORDER — CEPHALEXIN 500 MG PO CAPS: 500.0000 mg | ORAL_CAPSULE | Freq: Three times a day (TID) | ORAL | 0 refills | Status: DC

## 2017-07-08 MED ORDER — VALACYCLOVIR HCL 500 MG PO TABS: 500.0000 mg | ORAL_TABLET | Freq: Every day | ORAL | 5 refills | Status: DC

## 2017-07-08 NOTE — Assessment & Plan Note (Signed)
Itchy, painful raised with a clearing and then red ring around suggestive of a target lesion. Lyme, RMSF and Ehrlichia testing done. Given Keflex if increased pain, redness, swelling will use.

## 2017-07-08 NOTE — Progress Notes (Signed)
Subjective:  I acted as a Education administrator for Dr. Charlett Blake. [place], [place]  Patient ID: Danielle Sims, female    DOB: 08-02-1980, 37 y.o.   MRN: [id number]  No chief complaint on file.   HPI  Patient is in today for an acute visit for left arm bite. She recently visited [place] and was bitten there. No headaches or malaise. No GI upset. Lesion on left arm is improving today but has been itchy and red and painful. No other systemic symptoms has also noted 3 or 4 outbreaks of vesicular lesions on sacrum over past year and a history of HSV 2 positivitiy. Denies CP/palp/SOB/HA/congestion/fevers/GI or GU c/o. Taking meds as prescribed  Patient Care Team: Mosie Lukes, MD as PCP - General (Family Medicine)   Past Medical History:  Diagnosis Date  . Abnormal thyroid function test 03/15/2013   once, all normal since  . Allergic rhinitis 07/13/2011  . Anxiety attack 11/13/2011  . Bell's palsy 07/13/2011   Hx of, used to see Dr. Jacelyn Grip  . Chest pain, atypical 11/29/2011  . Fatigue 11/11/2015  . Fatty infiltration of liver 07/17/2015  . Folliculitis 5/78/4696  . GERD (gastroesophageal reflux disease) 05/31/2011   and nausea, sees Alonza Bogus in [place]  . Gonorrhea 02/08/2013  . History of Meniere's disease 09/14/2011  . 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 Hyperlipidemia, mixed 11/16/2016  . IBS (irritable bowel syndrome) 05/31/2011   sees Alonza Bogus in GI - hx rectal bleeding  . Left hip pain 11/11/2015   Has a history of left hip s/p an injury 6 years ago. Wearing heals twisted ankle in a whole and the next day her hip started hurting and has bothered her off and on since then.   . Migraines 07/13/2011  . Optic neuritis 07/12/2011   sees opthomologist  . Other and unspecified ovarian cysts   . Pain in the abdomen 11/16/2016  . 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 Preventative health care 11/11/2015  . Urticaria 01/11/2012  . UTI (urinary tract infection) 11/11/2015  . Vaginitis and vulvovaginitis 11/11/2015    Past  Surgical History:  Procedure Laterality Date  . APPENDECTOMY  2006   Enlarged  . BREAST ENHANCEMENT SURGERY  2008  . Cheek bones corrected     at 37 yo  . RHINOPLASTY     at 37 yo  . UPPER GASTROINTESTINAL ENDOSCOPY      Family History  Problem Relation Age of Onset  . Heart disease Mother   . Hyperlipidemia Mother   . Rheum arthritis Mother   . Fibroids Mother        Uterine  . Fibrocystic breast disease Mother   . Other Mother        gastritis  . Hypertension Mother   . Hyperlipidemia Maternal Grandmother   . Osteoarthritis Maternal Grandmother   . Glaucoma Maternal Grandmother   . Kidney disease Maternal Grandmother   . Colon cancer Maternal Grandfather   . Skin cancer Maternal Grandfather   . Prostate cancer Maternal Grandfather   . Throat cancer Maternal Grandfather   . Stomach cancer Maternal Grandfather   . Other Father        gastritis  . Heart disease Father        arteriosclerosis  . Arthritis Father        femoral head necrosis b/l  . Dementia Paternal Grandmother   . Heart disease Paternal Uncle   . Breast cancer Maternal Aunt     Social History  Socioeconomic History  . Marital status: Married    Spouse name: Not on file  . Number of children: 1  . Years of education: Not on file  . Highest education level: Not on file  Social Needs  . Financial resource strain: Not on file  . Food insecurity - worry: Not on file  . Food insecurity - inability: Not on file  . Transportation needs - medical: Not on file  . Transportation needs - non-medical: Not on file  Occupational History  . Occupation: CMA    Employer: [employer]  Tobacco Use  . Smoking status: Never Smoker  . Smokeless tobacco: Never Used  Substance and Sexual Activity  . Alcohol use: Yes    Alcohol/week: 0.0 oz    Comment: 1-2 glasses wine monthly  . Drug use: No  . Sexual activity: Yes    Partners: Male  Other Topics Concern  . Not on file  Social History  Narrative   Work or School: Public relations account executive, applying for PA school in [place] Situation: lives with husband and son 39, step daughter 74 and 52      Spiritual Beliefs: Christian      Lifestyle: dance videos; diet is good       Wears seat belt regularly    Outpatient Medications Prior to Visit  Medication Sig Dispense Refill  . aspirin-acetaminophen-caffeine (EXCEDRIN MIGRAINE) 250-250-65 MG per tablet Take 1 tablet by mouth every 6 (six) hours as needed. For migraine    . cyanocobalamin (,VITAMIN B-12,) 1000 MCG/ML injection Inject 1 mL (1,000 mcg total) into the muscle every 30 (thirty) days. 30 mL 6  . dicyclomine (BENTYL) 10 MG capsule Take 1 capsule (10 mg total) 3 (three) times daily as needed by mouth for spasms. 180 capsule 1  . diphenhydrAMINE (BENADRYL) 25 MG tablet Take 25 mg by mouth at bedtime as needed. For allergies    . hydrocortisone (ANUSOL-HC) 25 MG suppository Place 1 suppository (25 mg total) rectally at bedtime. 30 suppository 3  . loratadine (CLARITIN) 10 MG tablet Take 1 tablet (10 mg total) by mouth 2 (two) times daily as needed for allergies. 60 tablet   . OXcarbazepine ER 300 MG TB24 Take 300 mg by mouth daily. 90 tablet 3  . pantoprazole (PROTONIX) 40 MG tablet TAKE 1 TABLET BY MOUTH EVERY DAY 90 tablet 1  . SYRINGE-NEEDLE, DISP, 3 ML 25G X 1" 3 ML MISC Use with vitamin B 12 injections 50 each 1  . acyclovir (ZOVIRAX) 800 MG tablet Take 1 tablet (800 mg total) by mouth 3 (three) times daily. (Patient taking differently: Take 800 mg by mouth daily as needed (outbreak). ) 21 tablet 1  . hyoscyamine (LEVSIN SL) 0.125 MG SL tablet PLACE 1-2 TABLETS UNDER TONGUE EVERY 4 HOURS DAILY AS NEEDED FOR PAIN 30 tablet 3   No facility-administered medications prior to visit.     Allergies  Allergen Reactions  . Azithromycin     gastritis  . Iodinated Diagnostic Agents Hives    Pt developed 2 hives around site of injection Lt antecubital that were  itchy after leaving lobby post scan. Given 50 mg oral benadryl.     Review of Systems  Constitutional: Negative for fever and malaise/fatigue.  HENT: Negative for congestion.   Eyes: Negative for blurred vision.  Respiratory: Negative for shortness of breath.   Cardiovascular: Negative for chest pain, palpitations and leg swelling.  Gastrointestinal: Negative for abdominal  pain, blood in stool and nausea.  Genitourinary: Negative for dysuria and frequency.  Musculoskeletal: Negative for falls.  Skin: Positive for itching and rash.  Neurological: Negative for dizziness, loss of consciousness and headaches.  Endo/Heme/Allergies: Negative for environmental allergies.  Psychiatric/Behavioral: Negative for depression. The patient is not nervous/anxious.        Objective:    Physical Exam  Constitutional: She is oriented to person, place, and time. She appears well-developed and well-nourished. No distress.  HENT:  Head: Normocephalic and atraumatic.  Nose: Nose normal.  Eyes: Right eye exhibits no discharge. Left eye exhibits no discharge.  Neck: Normal range of motion. Neck supple.  Cardiovascular: Normal rate and regular rhythm.  No murmur heard. Pulmonary/Chest: Effort normal and breath sounds normal.  Abdominal: Soft. Bowel sounds are normal. There is no tenderness.  Musculoskeletal: She exhibits no edema.  Neurological: She is alert and oriented to person, place, and time.  Skin: Skin is warm and dry. Rash noted. There is erythema.  Raised, red 1 cm lesion on forearm a small amount of erythema around lesion.   Psychiatric: She has a normal mood and affect.  Nursing note and vitals reviewed.   BP 98/68 (BP Location: Left Arm, Patient Position: Sitting, Cuff Size: Normal)   Pulse 73   Temp 98.3 F (36.8 C) (Oral)   Resp 18   Wt 120 lb 9.6 oz (54.7 kg)   SpO2 98%   BMI 24.36 kg/m  Wt Readings from Last 3 Encounters:  07/08/17 120 lb 9.6 oz (54.7 kg)  12/31/16 116 lb  9.6 oz (52.9 kg)  11/16/16 115 lb 12.8 oz (52.5 kg)   BP Readings from Last 3 Encounters:  07/08/17 98/68  12/31/16 106/64  11/16/16 98/68     Immunization History  Administered Date(s) Administered  . DTaP 10/24/1981, 01/23/1982, 04/22/1982, 04/04/2004  . Hepatitis B 02/29/2004, 04/04/2004  . Hepatitis B, adult 09/27/2015  . IPV 10/24/1981, 01/23/1982, 04/22/1982  . Influenza,inj,Quad PF,6+ Mos 09/08/2015, 04/25/2016  . MMR 02/29/2004, 04/04/2004  . PPD Test 01/08/2012  . Td 07/02/2008  . Tdap 01/25/2017  . Varicella 02/29/2004, 04/04/2004    Health Maintenance  Topic Date Due  . INFLUENZA VACCINE  01/30/2017  . PAP SMEAR  10/11/2017  . TETANUS/TDAP  01/26/2027  . HIV Screening  Completed    Lab Results  Component Value Date   WBC 8.3 11/16/2016   HGB 13.4 11/16/2016   HCT 40.5 11/16/2016   PLT 336.0 11/16/2016   GLUCOSE 86 11/16/2016   CHOL 187 11/16/2016   TRIG 240.0 (H) 11/16/2016   HDL 46.60 11/16/2016   LDLDIRECT 102.0 11/16/2016   LDLCALC 111 (H) 10/12/2014   ALT 37 (H) 11/16/2016   AST 24 11/16/2016   NA 139 11/16/2016   K 4.0 11/16/2016   CL 104 11/16/2016   CREATININE 0.70 11/16/2016   BUN 18 11/16/2016   CO2 28 11/16/2016   TSH 3.19 09/24/2016   HGBA1C 5.8 10/12/2014    Lab Results  Component Value Date   TSH 3.19 09/24/2016   Lab Results  Component Value Date   WBC 8.3 11/16/2016   HGB 13.4 11/16/2016   HCT 40.5 11/16/2016   MCV 88.8 11/16/2016   PLT 336.0 11/16/2016   Lab Results  Component Value Date   NA 139 11/16/2016   K 4.0 11/16/2016   CO2 28 11/16/2016   GLUCOSE 86 11/16/2016   BUN 18 11/16/2016   CREATININE 0.70 11/16/2016   BILITOT 0.5 11/16/2016  ALKPHOS 71 11/16/2016   AST 24 11/16/2016   ALT 37 (H) 11/16/2016   PROT 7.6 11/16/2016   ALBUMIN 4.2 11/16/2016   CALCIUM 9.4 11/16/2016   ANIONGAP 12 06/05/2015   GFR 101.02 11/16/2016   Lab Results  Component Value Date   CHOL 187 11/16/2016   Lab Results    Component Value Date   HDL 46.60 11/16/2016   Lab Results  Component Value Date   LDLCALC 111 (H) 10/12/2014   Lab Results  Component Value Date   TRIG 240.0 (H) 11/16/2016   Lab Results  Component Value Date   CHOLHDL 4 11/16/2016   Lab Results  Component Value Date   HGBA1C 5.8 10/12/2014         Assessment & Plan:   Problem List Items Addressed This Visit    Facial paralysis/Bells palsy    No recent flares, can use Acyclovir burst as needed. Will hold Valtrex those days      Bite, insect    Itchy, painful raised with a clearing and then red ring around suggestive of a target lesion. Lyme, RMSF and Ehrlichia testing done. Given Keflex if increased pain, redness, swelling will use.       Relevant Orders   Rocky mtn spotted fvr abs pnl(IgG+IgM)   Ehrlichia antibody panel   B. Burgdorfi Antibodies by WB   HSV infection - Primary    Has been having recurrent blisters on sacrum. 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 Started on Valtrex 500 mg daily to suppress      Relevant Medications   acyclovir (ZOVIRAX) 800 MG tablet   cephALEXin (KEFLEX) 500 MG capsule   valACYclovir (VALTREX) 500 MG tablet    Other Visit Diagnoses    Bell's palsy       Relevant Medications   acyclovir (ZOVIRAX) 800 MG tablet      I have discontinued Pura Portilla's hyoscyamine. I have also changed her acyclovir. Additionally, I am having her start on cephALEXin and valACYclovir. Lastly, I am having her maintain her aspirin-acetaminophen-caffeine, diphenhydrAMINE, OXcarbazepine ER, loratadine, pantoprazole, SYRINGE-NEEDLE (DISP) 3 ML, cyanocobalamin, hydrocortisone, and dicyclomine.  Meds ordered this encounter  Medications  . acyclovir (ZOVIRAX) 800 MG tablet    Sig: Take 1 tablet by mouth 3 times daily as needed for outbreaks    Dispense:  90 tablet    Refill:  1  . cephALEXin (KEFLEX) 500 MG capsule    Sig: Take 1 capsule (500 mg total) by mouth 3 (three) times daily.     Dispense:  30 capsule    Refill:  0  . valACYclovir (VALTREX) 500 MG tablet    Sig: Take 1 tablet (500 mg total) by mouth daily.    Dispense:  30 tablet    Refill:  5    CMA served as scribe during this visit. History, Physical and Plan performed by medical provider. Documentation and orders reviewed and attested to.  Penni Homans, MD

## 2017-07-08 NOTE — Assessment & Plan Note (Signed)
Has been having recurrent blisters on sacrum. 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 Started on Valtrex 500 mg daily to suppress

## 2017-07-08 NOTE — Patient Instructions (Signed)

## 2017-07-08 NOTE — Assessment & Plan Note (Signed)
No recent flares, can use Acyclovir burst as needed. Will hold Valtrex those days

## 2017-07-15 ENCOUNTER — Telehealth: Payer: Self-pay | Admitting: Family Medicine

## 2017-07-15 ENCOUNTER — Other Ambulatory Visit: Payer: Self-pay | Admitting: Internal Medicine

## 2017-07-15 NOTE — Telephone Encounter (Signed)
Copied from [place] [id number]. Topic: Quick Communication - See Telephone Encounter >> Jul 15, 2017  3:32 PM Oneta Rack wrote: CRM for notification. See Telephone encounter for:   07/15/17.   Relation to pt: self  Call back number: [phone]    Reason for call:  Patient inquiring about lyme disease lab results, patient able to see other lab results, please advise

## 2017-07-16 ENCOUNTER — Telehealth: Payer: Self-pay | Admitting: Family Medicine

## 2017-07-16 LAB — B. BURGDORFI ANTIBODIES BY WB
B burgdorferi IgG Abs (IB): NEGATIVE
B burgdorferi IgM Abs (IB): POSITIVE — AB
Lyme Disease 18 kD IgG: NONREACTIVE
Lyme Disease 23 kD IgG: NONREACTIVE
Lyme Disease 23 kD IgM: NONREACTIVE
Lyme Disease 28 kD IgG: NONREACTIVE
Lyme Disease 30 kD IgG: NONREACTIVE
Lyme Disease 39 kD IgG: NONREACTIVE
Lyme Disease 39 kD IgM: REACTIVE — AB
Lyme Disease 41 kD IgG: NONREACTIVE
Lyme Disease 41 kD IgM: REACTIVE — AB
Lyme Disease 45 kD IgG: NONREACTIVE
Lyme Disease 58 kD IgG: NONREACTIVE
Lyme Disease 66 kD IgG: NONREACTIVE
Lyme Disease 93 kD IgG: NONREACTIVE

## 2017-07-16 LAB — ROCKY MTN SPOTTED FVR ABS PNL(IGG+IGM)
RMSF IgG: NOT DETECTED
RMSF IgM: NOT DETECTED

## 2017-07-16 LAB — EHRLICHIA ANTIBODY PANEL
E. CHAFFEENSIS AB IGG: <1:64 {titer}
E. CHAFFEENSIS AB IGM: <1:20 {titer}

## 2017-07-16 NOTE — Telephone Encounter (Signed)
Patient notified

## 2017-07-16 NOTE — Telephone Encounter (Signed)
Copied from [place] [id number]. Topic: Quick Communication - Lab Results >> Jul 16, 2017  4:44 PM Corie Chiquito, [place] wrote: Patient called to get there lab results for Lyme Diease. When results are in could someone give them a call back about them at [phone]

## 2017-07-16 NOTE — Telephone Encounter (Signed)
That test takes longer to run than the other test's. It also was not set up until that Wednesday because they ru that test on certain days.

## 2017-07-16 NOTE — Telephone Encounter (Signed)
Hey what did the lab say about her results? Thanks

## 2017-08-01 ENCOUNTER — Other Ambulatory Visit: Payer: Self-pay

## 2017-08-01 DIAGNOSIS — G510 Bell's palsy: Secondary | ICD-10-CM

## 2017-08-01 MED ORDER — ACYCLOVIR 800 MG PO TABS
ORAL_TABLET | ORAL | 1 refills | Status: DC
Start: 2017-08-01 — End: 2017-11-06

## 2017-08-21 ENCOUNTER — Encounter: Payer: Self-pay | Admitting: Family Medicine

## 2017-08-22 ENCOUNTER — Other Ambulatory Visit: Payer: Self-pay | Admitting: Family Medicine

## 2017-08-22 DIAGNOSIS — I059 Rheumatic mitral valve disease, unspecified: Secondary | ICD-10-CM

## 2017-08-22 DIAGNOSIS — R002 Palpitations: Secondary | ICD-10-CM

## 2017-09-09 NOTE — Progress Notes (Signed)
Cardiology Office Note   Date:  09/10/2017   ID:  Danielle Sims, DOB [birth year], MRN [id number]  PCP:  Mosie Lukes, MD  Cardiologist:  Dr. Stanford Breed (not seen by cardiology since 05/08/2016)   Chief Complaint  Patient presents with  . Palpitations    pac, pvc, throat spasms  . Shortness of Breath    dizzy,  "not orthostatic"     History of Present Illness: Danielle Sims is a 37 y.o. female who presents for ongoing assessment and management of chronic chest pain, with dyspnea. Prior cardiac testing revealed systolic function, normal stress test, and normal V/Q lung scan. On last visit with Dr. Stanford Breed on 05/08/2016 the patient was scheduled for an exercise treadmill for a stratification. This is completed 05/30/2016, again revealing no ischemia good exercise capacity normal BP response to stress, no ST segment deviation noted during stress.  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 He has noticed that he is having more frequent racing heart rate and palpitations. She wears an Apple watch with an APP that allows her to check her EKG during the time that she's experiencing palpitations. When checking she is seeing his frequent PVCs and PAC's. She states time she feels the palpitations occurring both in her chest and into her throat, and then she has involuntary coughing spell, which helps both to subside.  She also complains of unilateral leg pain on the right, usually behind her knee. No evidence of intermittent claudication symptoms or unilateral edema, tingling, or calf pain.  Past Medical History:  Diagnosis Date  . Abnormal thyroid function test 03/15/2013   once, all normal since  . Allergic rhinitis 07/13/2011  . Anxiety attack 11/13/2011  . Bell's palsy 07/13/2011   Hx of, used to see Dr. Jacelyn Grip  . Chest pain, atypical 11/29/2011  . Fatigue 11/11/2015  . Fatty infiltration of liver 07/17/2015  . Folliculitis 12/30/1599  . GERD (gastroesophageal reflux  disease) 05/31/2011   and nausea, sees Danielle Sims in [place]  . Gonorrhea 02/08/2013  . History of Meniere's disease 09/14/2011  . 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 Hyperlipidemia, mixed 11/16/2016  . IBS (irritable bowel syndrome) 05/31/2011   sees Danielle Sims in GI - hx rectal bleeding  . Left hip pain 11/11/2015   Has a history of left hip s/p an injury 6 years ago. Wearing heals twisted ankle in a whole and the next day her hip started hurting and has bothered her off and on since then.   . Migraines 07/13/2011  . Optic neuritis 07/12/2011   sees opthomologist  . Other and unspecified ovarian cysts   . Pain in the abdomen 11/16/2016  . 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 Preventative health care 11/11/2015  . Urticaria 01/11/2012  . UTI (urinary tract infection) 11/11/2015  . Vaginitis and vulvovaginitis 11/11/2015    Past Surgical History:  Procedure Laterality Date  . APPENDECTOMY  2006   Enlarged  . BREAST ENHANCEMENT SURGERY  2008  . Cheek bones corrected     at 37 yo  . RHINOPLASTY     at 37 yo  . UPPER GASTROINTESTINAL ENDOSCOPY       Current Outpatient Medications  Medication Sig Dispense Refill  . acyclovir (ZOVIRAX) 800 MG tablet Take 1 tablet by mouth 3 times daily as needed for outbreaks 90 tablet 1  . aspirin-acetaminophen-caffeine (EXCEDRIN MIGRAINE) 250-250-65 MG per tablet Take 1 tablet by mouth every 6 (six) hours as needed. For migraine    .  cyanocobalamin (,VITAMIN B-12,) 1000 MCG/ML injection Inject 1 mL (1,000 mcg total) into the muscle every 30 (thirty) days. 30 mL 6  . dicyclomine (BENTYL) 10 MG capsule Take 1 capsule (10 mg total) 3 (three) times daily as needed by mouth for spasms. 180 capsule 1  . diphenhydrAMINE (BENADRYL) 25 MG tablet Take 25 mg by mouth at bedtime as needed. For allergies    . hydrocortisone (ANUSOL-HC) 25 MG suppository Place 1 suppository (25 mg total) rectally at bedtime. 30 suppository 3  . loratadine (CLARITIN) 10 MG tablet Take 1 tablet (10 mg  total) by mouth 2 (two) times daily as needed for allergies. 60 tablet   . OXcarbazepine ER 300 MG TB24 Take 300 mg by mouth daily. 90 tablet 3  . pantoprazole (PROTONIX) 40 MG tablet TAKE 1 TABLET BY MOUTH EVERY DAY 90 tablet 1  . SYRINGE-NEEDLE, DISP, 3 ML 25G X 1" 3 ML MISC Use with vitamin B 12 injections 50 each 1  . valACYclovir (VALTREX) 500 MG tablet Take 1 tablet (500 mg total) by mouth daily. 30 tablet 5   No current facility-administered medications for this visit.     Allergies:   Azithromycin and Iodinated diagnostic agents    Social History:  The patient  reports that  has never smoked. she has never used smokeless tobacco. She reports that she drinks alcohol. She reports that she does not use drugs.   Family History:  The patient's family history includes Arthritis in her father; Breast cancer in her maternal aunt; Colon cancer in her maternal grandfather; Dementia in her paternal grandmother; Fibrocystic breast disease in her mother; Fibroids in her mother; Glaucoma in her maternal grandmother; Heart disease in her father, mother, and paternal uncle; Hyperlipidemia in her maternal grandmother and mother; Hypertension in her mother; Kidney disease in her maternal grandmother; Osteoarthritis in her maternal grandmother; Other in her father and mother; Prostate cancer in her maternal grandfather; Rheum arthritis in her mother; Skin cancer in her maternal grandfather; Stomach cancer in her maternal grandfather; Throat cancer in her maternal grandfather.    ROS: All other systems are reviewed and negative. Unless otherwise mentioned in H&P    PHYSICAL EXAM: VS:  BP 104/68   Pulse 68   Ht 4\' 11"  (1.499 m)   Wt 115 lb 9.6 oz (52.4 kg)   BMI 23.35 kg/m  , BMI Body mass index is 23.35 kg/m. GEN: Well nourished, well developed, in no acute distress  HEENT: normal  Neck: no JVD, carotid bruits, or masses Cardiac: RRR; no murmurs, rubs, or gallops,no edema  Respiratory:  clear to  auscultation bilaterally, normal work of breathing GI: soft, nontender, nondistended, + BS MS: no deformity or atrophy no right lower leg edema, some discomfort when palpating the popliteal area.  Skin: warm and dry, no rash Neuro:  Strength and sensation are intact Psych: euthymic mood, full affect   EKG:  Normal sinus rhythm, nonspecific ST anterior leads and in lead III. Heart rate of 60 bpm.  Recent Labs: 09/24/2016: TSH 3.19 11/16/2016: ALT 37; BUN 18; Creatinine, Ser 0.70; Hemoglobin 13.4; Platelets 336.0; Potassium 4.0; Sodium 139    Lipid Panel    Component Value Date/Time   CHOL 187 11/16/2016 0817   TRIG 240.0 (H) 11/16/2016 0817   HDL 46.60 11/16/2016 0817   CHOLHDL 4 11/16/2016 0817   VLDL 48.0 (H) 11/16/2016 0817   LDLCALC 111 (H) 10/12/2014 0911   LDLDIRECT 102.0 11/16/2016 0817      Wt  Readings from Last 3 Encounters:  09/10/17 115 lb 9.6 oz (52.4 kg)  07/08/17 120 lb 9.6 oz (54.7 kg)  12/31/16 116 lb 9.6 oz (52.9 kg)      Other studies Reviewed: Study Highlights     Blood pressure demonstrated a normal response to exercise.  There was no ST segment deviation noted during stress.   No ischemia. Good exercise capacity. Normal BP response to stress.    Echocardiogram 05/31/2016 Left ventricle: The cavity size was normal. Systolic function was   normal. The estimated ejection fraction was in the range of 60%   to 65%. - Mitral valve: There was mild regurgitation.  ASSESSMENT AND PLAN:  1.  Frequent palpitations: The patient is symptomatic stating that they occur several times a week. At first she thought it was related to stress as she is in [place] school. She has been careful about her caffeine intake. She is sleeping well. She is not on any AV nodal blocking agents at this time. Heart rate is well controlled. Her to place a 30 day cardiac monitor to evaluate frequency and morphology of her palpitations. I will not add any medications at this time.    I will BMET, magnesium, and TSH. Echocardiogram was already ordered and completed this a.m. results is visible at this time  2. Unilateral right lower leg pain: No evidence of edema, heat, or skin color change. She does have some pain in the popliteal area. She may have a Baker's cyst. It seems unlikely that she has DVT, but will be thorough to evaluate further. Labs are being checked as above.   Current medicines are reviewed at length with the patient today.    Labs/ tests ordered today include: BMET, magnesium, TSH, lower extremity venous Doppler ultrasound  Phill Myron. West Pugh, ANP, AACC   09/10/2017 9:08 AM    [facility] [address] [place], [place] [address] Phone: [phone]; Fax: [phone]

## 2017-09-10 ENCOUNTER — Other Ambulatory Visit: Payer: Self-pay

## 2017-09-10 ENCOUNTER — Ambulatory Visit: Payer: 59 | Admitting: Adult Health

## 2017-09-10 ENCOUNTER — Encounter: Payer: Self-pay | Admitting: Adult Health

## 2017-09-10 ENCOUNTER — Ambulatory Visit (HOSPITAL_COMMUNITY): Payer: 59 | Attending: Cardiology

## 2017-09-10 VITALS — BP 104/68 | HR 68 | Ht 59.0 in | Wt 115.6 lb

## 2017-09-10 DIAGNOSIS — R002 Palpitations: Secondary | ICD-10-CM | POA: Diagnosis not present

## 2017-09-10 DIAGNOSIS — I059 Rheumatic mitral valve disease, unspecified: Secondary | ICD-10-CM | POA: Diagnosis not present

## 2017-09-10 DIAGNOSIS — Z79899 Other long term (current) drug therapy: Secondary | ICD-10-CM | POA: Diagnosis not present

## 2017-09-10 DIAGNOSIS — M79609 Pain in unspecified limb: Secondary | ICD-10-CM | POA: Diagnosis not present

## 2017-09-10 DIAGNOSIS — I340 Nonrheumatic mitral (valve) insufficiency: Secondary | ICD-10-CM | POA: Diagnosis not present

## 2017-09-10 NOTE — Patient Instructions (Addendum)
Medication Instructions:  NO CHANGES- Your physician recommends that you continue on your current medications as directed. Please refer to the Current Medication list given to you today.  If you need a refill on your cardiac medications before your next appointment, please call your pharmacy.  Labwork: BMET,MAG AND TSH TODAY HERE IN OUR OFFICE AT [facility]  Take the provided lab slips for you to take with you to the lab for you blood draw.   Testing/Procedures: Your physician has recommended that you wear a 30DAY event monitor. This will be performed at our [facility] location - [address].  Event monitors are medical devices that record the heart's electrical activity. Doctors most often [place] these monitors to diagnose arrhythmias. Arrhythmias are problems with the speed or rhythm of the heartbeat. The monitor is a small, portable device. You can wear one while you do your normal daily activities. This is usually used to diagnose what is causing palpitations/syncope (passing out).  Your physician has requested that you have a lower extremity arterial duplex. This test is an ultrasound of the arteries in the legs or arms. It looks at arterial blood flow in the legs and arms. Allow one hour for Lower and Upper Arterial scans. There are no restrictions or special instructions  Follow-Up: Your physician wants you to follow-up in: [place] ([place]), DNP.   Thank you for choosing [facility] at [facility]!!

## 2017-09-11 ENCOUNTER — Other Ambulatory Visit: Payer: Self-pay | Admitting: Adult Health

## 2017-09-11 DIAGNOSIS — M79609 Pain in unspecified limb: Secondary | ICD-10-CM

## 2017-09-11 LAB — BASIC METABOLIC PANEL
BUN / CREAT RATIO: 16 (ref 9–23)
BUN: 11 mg/dL (ref 6–20)
CALCIUM: 8.9 mg/dL (ref 8.7–10.2)
CHLORIDE: 105 mmol/L (ref 96–106)
CO2: 24 mmol/L (ref 20–29)
Creatinine, Ser: 0.67 mg/dL (ref 0.57–1.00)
GFR calc Af Amer: 131 mL/min/{1.73_m2} (ref >59)
GFR calc non Af Amer: 113 mL/min/{1.73_m2} (ref >59)
GLUCOSE: 93 mg/dL (ref 65–99)
Potassium: 4.6 mmol/L (ref 3.5–5.2)
Sodium: 142 mmol/L (ref 134–144)

## 2017-09-11 LAB — TSH: TSH: 2.98 u[IU]/mL (ref 0.450–4.500)

## 2017-09-11 LAB — MAGNESIUM: Magnesium: 2.2 mg/dL (ref 1.6–2.3)

## 2017-09-23 ENCOUNTER — Ambulatory Visit (HOSPITAL_COMMUNITY)
Admission: RE | Admit: 2017-09-23 | Discharge: 2017-09-23 | Disposition: A | Discharge status: Home / Self Care | Payer: 59 | Source: Ambulatory Visit | Attending: Cardiovascular Disease | Admitting: Cardiovascular Disease

## 2017-09-23 ENCOUNTER — Ambulatory Visit (INDEPENDENT_AMBULATORY_CARE_PROVIDER_SITE_OTHER): Payer: 59

## 2017-09-23 DIAGNOSIS — R002 Palpitations: Secondary | ICD-10-CM

## 2017-09-23 DIAGNOSIS — M79609 Pain in unspecified limb: Secondary | ICD-10-CM | POA: Insufficient documentation

## 2017-10-14 ENCOUNTER — Ambulatory Visit: Payer: 59 | Admitting: Family Medicine

## 2017-10-14 ENCOUNTER — Encounter: Payer: Self-pay | Admitting: Family Medicine

## 2017-10-14 VITALS — BP 113/75 | HR 73 | Temp 99.0°F | Resp 18 | Ht 59.0 in | Wt 115.0 lb

## 2017-10-14 DIAGNOSIS — K625 Hemorrhage of anus and rectum: Secondary | ICD-10-CM

## 2017-10-14 DIAGNOSIS — K648 Other hemorrhoids: Secondary | ICD-10-CM

## 2017-10-14 LAB — POC HEMOCCULT BLD/STL (OFFICE/1-CARD/DIAGNOSTIC): Fecal Occult Blood, POC: POSITIVE — AB

## 2017-10-14 MED ORDER — HYDROCORTISONE ACETATE 25 MG RE SUPP: 25.0000 mg | Freq: Every day | RECTAL | 0 refills | Status: DC

## 2017-10-14 NOTE — Patient Instructions (Signed)
Take miralax in 1 cap of water daily (taper up or down depending on BM). Goal is soft but formed stools daily.   Star probiotic.  Use anusol suppositories to encourage area to heal.  Should stat to heal within the next week. If it does not not we would want you to get into see your gastroenterologist.    Hemorrhoids Hemorrhoids are swollen veins in and around the rectum or anus. There are two types of hemorrhoids:  Internal hemorrhoids. These occur in the veins that are just inside the rectum. They may poke through to the outside and become irritated and painful.  External hemorrhoids. These occur in the veins that are outside of the anus and can be felt as a painful swelling or hard lump near the anus.  Most hemorrhoids do not cause serious problems, and they can be managed with home treatments such as diet and lifestyle changes. If home treatments do not help your symptoms, procedures can be done to shrink or remove the hemorrhoids. What are the causes? This condition is caused by increased pressure in the anal area. This pressure may result from various things, including:  Constipation.  Straining to have a bowel movement.  Diarrhea.  Pregnancy.  Obesity.  Sitting for long periods of time.  Heavy lifting or other activity that causes you to strain.  Anal sex.  What are the signs or symptoms? Symptoms of this condition include:  Pain.  Anal itching or irritation.  Rectal bleeding.  Leakage of stool (feces).  Anal swelling.  One or more lumps around the anus.  How is this diagnosed? This condition can often be diagnosed through a visual exam. Other exams or tests may also be done, such as:  Examination of the rectal area with a gloved hand (digital rectal exam).  Examination of the anal canal using a small tube (anoscope).  A blood test, if you have lost a significant amount of blood.  A test to look inside the colon (sigmoidoscopy or colonoscopy).  How is  this treated? This condition can usually be treated at home. However, various procedures may be done if dietary changes, lifestyle changes, and other home treatments do not help your symptoms. These procedures can help make the hemorrhoids smaller or remove them completely. Some of these procedures involve surgery, and others do not. Common procedures include:  Rubber band ligation. Rubber bands are placed at the base of the hemorrhoids to cut off the blood supply to them.  Sclerotherapy. Medicine is injected into the hemorrhoids to shrink them.  Infrared coagulation. A type of light energy is used to get rid of the hemorrhoids.  Hemorrhoidectomy surgery. The hemorrhoids are surgically removed, and the veins that supply them are tied off.  Stapled hemorrhoidopexy surgery. A circular stapling device is used to remove the hemorrhoids and use staples to cut off the blood supply to them.  Follow these instructions at home: Eating and drinking  Eat foods that have a lot of fiber in them, such as whole grains, beans, nuts, fruits, and vegetables. Ask your health care provider about taking products that have added fiber (fiber supplements).  Drink enough fluid to keep your urine clear or pale yellow. Managing pain and swelling  Take warm sitz baths for 20 minutes, 3-4 times a day to ease pain and discomfort.  If directed, apply ice to the affected area. Using ice packs between sitz baths may be helpful. ? Put ice in a plastic bag. ? Place a towel between  your skin and the bag. ? Leave the ice on for 20 minutes, 2-3 times a day. General instructions  Take over-the-counter and prescription medicines only as told by your health care provider.  Use medicated creams or suppositories as told.  Exercise regularly.  Go to the bathroom when you have the urge to have a bowel movement. Do not wait.  Avoid straining to have bowel movements.  Keep the anal area dry and clean. Use wet toilet paper  or moist towelettes after a bowel movement.  Do not sit on the toilet for long periods of time. This increases blood pooling and pain. Contact a health care provider if:  You have increasing pain and swelling that are not controlled by treatment or medicine.  You have uncontrolled bleeding.  You have difficulty having a bowel movement, or you are unable to have a bowel movement.  You have pain or inflammation outside the area of the hemorrhoids. This information is not intended to replace advice given to you by your health care provider. Make sure you discuss any questions you have with your health care provider. Document Released: 06/15/2000 Document Revised: 11/16/2015 Document Reviewed: 03/02/2015 Elsevier Interactive Patient Education  [employer].

## 2017-10-14 NOTE — Progress Notes (Signed)
Danielle Sims , 1980/09/22, 37 y.o., female MRN: [id number] Patient Care Team    Relationship Specialty Notifications Start End  Mosie Lukes, MD PCP - General Family Medicine  01/24/15     Chief Complaint  Patient presents with  . Rectal Bleeding    bright red blood x 5 days     Subjective: Pt presents for an OV with complaints of rectal bleeding after BM of 5 days duration.  Associated symptoms include consitipation. She denies fever, chills, nausea, vomit, diarrhea, dizziness or increased fatigue. She reports she has been constipated for 5 days and this morning her BM was normal. She has visualized bright red blood on her TP, toilet Bowl and in underwear after BM only. She denies abd pain.  Depression screen [facility] 2/9 10/14/2017  Decreased Interest 0  Down, Depressed, Hopeless 0  PHQ - 2 Score 0    Allergies  Allergen Reactions  . Azithromycin     gastritis  . Iodinated Diagnostic Agents Hives    Pt developed 2 hives around site of injection Lt antecubital that were itchy after leaving lobby post scan. Given 50 mg oral benadryl.    Social History   Tobacco Use  . Smoking status: Never Smoker  . Smokeless tobacco: Never Used  Substance Use Topics  . Alcohol use: Yes    Alcohol/week: 0.0 oz    Comment: 1-2 glasses wine monthly   Past Medical History:  Diagnosis Date  . Abnormal thyroid function test 03/15/2013   once, all normal since  . Allergic rhinitis 07/13/2011  . Anxiety attack 11/13/2011  . Bell's palsy 07/13/2011   Hx of, used to see Dr. Jacelyn Grip  . Chest pain, atypical 11/29/2011  . Fatigue 11/11/2015  . Fatty infiltration of liver 07/17/2015  . Folliculitis 9/32/6712  . GERD (gastroesophageal reflux disease) 05/31/2011   and nausea, sees Alonza Bogus in [place]  . Gonorrhea 02/08/2013  . History of Meniere's disease 09/14/2011  . 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 Hyperlipidemia, mixed 11/16/2016  . IBS (irritable bowel syndrome) 05/31/2011   sees Alonza Bogus in GI - hx  rectal bleeding  . Left hip pain 11/11/2015   Has a history of left hip s/p an injury 6 years ago. Wearing heals twisted ankle in a whole and the next day her hip started hurting and has bothered her off and on since then.   . Migraines 07/13/2011  . Optic neuritis 07/12/2011   sees opthomologist  . Other and unspecified ovarian cysts   . Pain in the abdomen 11/16/2016  . 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 Preventative health care 11/11/2015  . Urticaria 01/11/2012  . UTI (urinary tract infection) 11/11/2015  . Vaginitis and vulvovaginitis 11/11/2015   Past Surgical History:  Procedure Laterality Date  . APPENDECTOMY  2006   Enlarged  . BREAST ENHANCEMENT SURGERY  2008  . Cheek bones corrected     at 37 yo  . RHINOPLASTY     at 37 yo  . UPPER GASTROINTESTINAL ENDOSCOPY     Family History  Problem Relation Age of Onset  . Heart disease Mother   . Hyperlipidemia Mother   . Rheum arthritis Mother   . Fibroids Mother        Uterine  . Fibrocystic breast disease Mother   . Other Mother        gastritis  . Hypertension Mother   . Hyperlipidemia Maternal Grandmother   . Osteoarthritis Maternal Grandmother   . Glaucoma Maternal Grandmother   .  Kidney disease Maternal Grandmother   . Colon cancer Maternal Grandfather   . Skin cancer Maternal Grandfather   . Prostate cancer Maternal Grandfather   . Throat cancer Maternal Grandfather   . Stomach cancer Maternal Grandfather   . Other Father        gastritis  . Heart disease Father        arteriosclerosis  . Arthritis Father        femoral head necrosis b/l  . Dementia Paternal Grandmother   . Heart disease Paternal Uncle   . Breast cancer Maternal Aunt    Allergies as of 10/14/2017      Reactions   Azithromycin    gastritis   Iodinated Diagnostic Agents Hives   Pt developed 2 hives around site of injection Lt antecubital that were itchy after leaving lobby post scan. Given 50 mg oral benadryl.       Medication List        Accurate  as of 10/14/17  1:41 PM. Always use your most recent med list.          acyclovir 800 MG tablet Commonly known as:  ZOVIRAX Take 1 tablet by mouth 3 times daily as needed for outbreaks   aspirin-acetaminophen-caffeine 250-250-65 MG tablet Commonly known as:  EXCEDRIN MIGRAINE Take 1 tablet by mouth every 6 (six) hours as needed. For migraine   cyanocobalamin 1000 MCG/ML injection Commonly known as:  (VITAMIN B-12) Inject 1 mL (1,000 mcg total) into the muscle every 30 (thirty) days.   dicyclomine 10 MG capsule Commonly known as:  BENTYL Take 1 capsule (10 mg total) 3 (three) times daily as needed by mouth for spasms.   diphenhydrAMINE 25 MG tablet Commonly known as:  BENADRYL Take 25 mg by mouth at bedtime as needed. For allergies   hydrocortisone 25 MG suppository Commonly known as:  ANUSOL-HC Place 1 suppository (25 mg total) rectally at bedtime.   loratadine 10 MG tablet Commonly known as:  CLARITIN Take 1 tablet (10 mg total) by mouth 2 (two) times daily as needed for allergies.   OXcarbazepine ER 300 MG Tb24 Take 300 mg by mouth daily.   pantoprazole 40 MG tablet Commonly known as:  PROTONIX TAKE 1 TABLET BY MOUTH EVERY DAY   SYRINGE-NEEDLE (DISP) 3 ML 25G X 1" 3 ML Misc Use with vitamin B 12 injections   valACYclovir 500 MG tablet Commonly known as:  VALTREX Take 1 tablet (500 mg total) by mouth daily.       All past medical history, surgical history, allergies, family history, immunizations andmedications were updated in the EMR today and reviewed under the history and medication portions of their EMR.     ROS: Negative, with the exception of above mentioned in HPI   Objective:  BP 113/75 (BP Location: Left Arm, Patient Position: Sitting, Cuff Size: Normal)   Pulse 73   Temp 99 F (37.2 C)   Resp 18   Ht 4\' 11"  (1.499 m)   Wt 115 lb (52.2 kg)   LMP 09/19/2017   SpO2 100%   BMI 23.23 kg/m  Body mass index is 23.23 kg/m. Gen: Afebrile. No acute  distress. Nontoxic in appearance, well developed, well nourished.  HENT: AT. [place].  MMM, no oral lesions.  Eyes:Pupils Equal Round Reactive to light, Extraocular movements intact,  Conjunctiva without redness, discharge or icterus. Rectal exam:  tenderness noted, internal hemorrhoids noted, external hemorrhoids noted, sphincter tone normal, stool guaiac positive.  No exam data present No  results found. No results found for this or any previous visit (from the past 24 hour(s)).  Assessment/Plan: Danielle Sims is a 37 y.o. female present for OV for  Rectal bleeding Internal hemorrhoid, bleeding Internal hemorrhoid and external hemorrhoid. No fissure identified, but could be small. Discussed options with her today.  FOBT positive today. No systemic symptoms.  - start anusol supp. Miralax. Goal soft but formed stools.  - start probiotic. Samples provided today.  AVS on hemorrhoids and emergent care provided today.    Reviewed expectations re: course of current medical issues.  Discussed self-management of symptoms.  Outlined signs and symptoms indicating need for more acute intervention.  Patient verbalized understanding and all questions were answered.  Patient received an After-Visit Summary.    No orders of the defined types were placed in this encounter.    Note is dictated utilizing voice recognition software. Although note has been proof read prior to signing, occasional typographical errors still can be missed. If any questions arise, please do not hesitate to call for verification.   electronically signed by:  Howard Pouch, DO  [place]

## 2017-10-16 ENCOUNTER — Telehealth: Payer: Self-pay | Admitting: *Deleted

## 2017-10-16 MED ORDER — HYDROCORTISONE ACETATE 25 MG RE SUPP: 25.0000 mg | Freq: Two times a day (BID) | RECTAL | 0 refills | Status: AC

## 2017-10-16 MED ORDER — HYDROCORTISONE 2.5 % RE CREA: 1.0000 "application " | TOPICAL_CREAM | Freq: Two times a day (BID) | RECTAL | 0 refills | Status: AC

## 2017-10-16 NOTE — Telephone Encounter (Signed)
Pt needs a rectal suppository for internal hemorrhoid. Pharm request sent to change to cream, cream would not help internal hemorrhoid. Decreased amount of supp to see if she can afford and resent script. Will also send for cream in the event she can not afford supp. Please make pt aware.

## 2017-10-16 NOTE — Telephone Encounter (Signed)
Copied from [place] [id number]. Topic: General - Other >> Oct 16, 2017  8:44 AM Yvette Rack wrote: Reason for CRM: Kim from [id number] - [place], [place]  calling to let provider know that they had faxed over a request to change the hydrocortisone (ANUSOL-HC) 25 MG suppository the insurance want pay for this but they will pay for the Proctosol cream  Left detailed message with information and instructions on patient voice mail per [facility]

## 2017-10-28 NOTE — Progress Notes (Deleted)
Cardiology Office Note   Date:  10/28/2017   ID:  Danielle Sims, DOB [birth year], MRN [id number]  PCP:  Mosie Lukes, MD  Cardiologist:  Lubertha South  No chief complaint on file.    History of Present Illness: Danielle Sims is a 37 y.o. female who presents for follow up for ongoing assessment and management of chronic chest pain and dyspnea. The patient had GXT on 05/30/2016 without evidence of ischemia.   On last visit, on 312/2019 she was complaining of palpitations. She wears an Visual merchandiser with an APP that allows her to check her EKG during the time that she's experiencing palpitations, She reported that she was experiencing frequent PVC's and PAC's. She also complained of unilateral edema, tingling and calf pain on the right, usually behind the knee. 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   Labs were checked to evaluate Mg+. TSH and BMET. I also checked venous studies due to her complaints of leg pain. Venous dopplers were normal, no evidence of cysts or DVT. Recommendations for support hose were offered. Labs did not have evidence of renal insufficiency, anemia, thyroid disease, or hypomagnesemia. .     Past Medical History:  Diagnosis Date  . Abnormal thyroid function test 03/15/2013   once, all normal since  . Allergic rhinitis 07/13/2011  . Anxiety attack 11/13/2011  . Bell's palsy 07/13/2011   Hx of, used to see Dr. Jacelyn Grip  . Chest pain, atypical 11/29/2011  . Fatigue 11/11/2015  . Fatty infiltration of liver 07/17/2015  . Folliculitis 9/62/8366  . GERD (gastroesophageal reflux disease) 05/31/2011   and nausea, sees Alonza Bogus in [place]  . Gonorrhea 02/08/2013  . History of Meniere's disease 09/14/2011  . 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 Hyperlipidemia, mixed 11/16/2016  . IBS (irritable bowel syndrome) 05/31/2011   sees Alonza Bogus in GI - hx rectal bleeding  . Left hip pain 11/11/2015   Has a history of left hip s/p an injury 6 years ago. Wearing heals  twisted ankle in a whole and the next day her hip started hurting and has bothered her off and on since then.   . Migraines 07/13/2011  . Optic neuritis 07/12/2011   sees opthomologist  . Other and unspecified ovarian cysts   . Pain in the abdomen 11/16/2016  . 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 Preventative health care 11/11/2015  . Urticaria 01/11/2012  . UTI (urinary tract infection) 11/11/2015  . Vaginitis and vulvovaginitis 11/11/2015    Past Surgical History:  Procedure Laterality Date  . APPENDECTOMY  2006   Enlarged  . BREAST ENHANCEMENT SURGERY  2008  . Cheek bones corrected     at 37 yo  . RHINOPLASTY     at 37 yo  . UPPER GASTROINTESTINAL ENDOSCOPY       Current Outpatient Medications  Medication Sig Dispense Refill  . acyclovir (ZOVIRAX) 800 MG tablet Take 1 tablet by mouth 3 times daily as needed for outbreaks 90 tablet 1  . aspirin-acetaminophen-caffeine (EXCEDRIN MIGRAINE) 250-250-65 MG per tablet Take 1 tablet by mouth every 6 (six) hours as needed. For migraine    . cyanocobalamin (,VITAMIN B-12,) 1000 MCG/ML injection Inject 1 mL (1,000 mcg total) into the muscle every 30 (thirty) days. 30 mL 6  . dicyclomine (BENTYL) 10 MG capsule Take 1 capsule (10 mg total) 3 (three) times daily as needed by mouth for spasms. 180 capsule 1  . diphenhydrAMINE (BENADRYL) 25 MG tablet Take 25 mg by mouth  at bedtime as needed. For allergies    . hydrocortisone (ANUSOL-HC) 2.5 % rectal cream Place 1 application rectally 2 (two) times daily. 30 g 0  . hydrocortisone (ANUSOL-HC) 25 MG suppository Place 1 suppository (25 mg total) rectally 2 (two) times daily. 6 suppository 0  . loratadine (CLARITIN) 10 MG tablet Take 1 tablet (10 mg total) by mouth 2 (two) times daily as needed for allergies. 60 tablet   . OXcarbazepine ER 300 MG TB24 Take 300 mg by mouth daily. 90 tablet 3  . pantoprazole (PROTONIX) 40 MG tablet TAKE 1 TABLET BY MOUTH EVERY DAY 90 tablet 1  . SYRINGE-NEEDLE, DISP, 3 ML 25G X 1" 3  ML MISC Use with vitamin B 12 injections 50 each 1  . valACYclovir (VALTREX) 500 MG tablet Take 1 tablet (500 mg total) by mouth daily. 30 tablet 5   No current facility-administered medications for this visit.     Allergies:   Azithromycin and Iodinated diagnostic agents    Social History:  The patient  reports that she has never smoked. She has never used smokeless tobacco. She reports that she drinks alcohol. She reports that she does not use drugs.   Family History:  The patient's family history includes Arthritis in her father; Breast cancer in her maternal aunt; Colon cancer in her maternal grandfather; Dementia in her paternal grandmother; Fibrocystic breast disease in her mother; Fibroids in her mother; Glaucoma in her maternal grandmother; Heart disease in her father, mother, and paternal uncle; Hyperlipidemia in her maternal grandmother and mother; Hypertension in her mother; Kidney disease in her maternal grandmother; Osteoarthritis in her maternal grandmother; Other in her father and mother; Prostate cancer in her maternal grandfather; Rheum arthritis in her mother; Skin cancer in her maternal grandfather; Stomach cancer in her maternal grandfather; Throat cancer in her maternal grandfather.    ROS: All other systems are reviewed and negative. Unless otherwise mentioned in H&P    PHYSICAL EXAM: VS:  There were no vitals taken for this visit. , BMI There is no height or weight on file to calculate BMI. GEN: Well nourished, well developed, in no acute distress  HEENT: normal  Neck: no JVD, carotid bruits, or masses Cardiac: ***RRR; no murmurs, rubs, or gallops,no edema  Respiratory:  clear to auscultation bilaterally, normal work of breathing GI: soft, nontender, nondistended, + BS MS: no deformity or atrophy  Skin: warm and dry, no rash Neuro:  Strength and sensation are intact Psych: euthymic mood, full affect   EKG:  EKG {ACTION; IS/IS TIR:[id number]} ordered today. The ekg  ordered today demonstrates ***   Recent Labs: 11/16/2016: ALT 37; Hemoglobin 13.4; Platelets 336.0 09/10/2017: BUN 11; Creatinine, Ser 0.67; Magnesium 2.2; Potassium 4.6; Sodium 142; TSH 2.980    Lipid Panel    Component Value Date/Time   CHOL 187 11/16/2016 0817   TRIG 240.0 (H) 11/16/2016 0817   HDL 46.60 11/16/2016 0817   CHOLHDL 4 11/16/2016 0817   VLDL 48.0 (H) 11/16/2016 0817   LDLCALC 111 (H) 10/12/2014 0911   LDLDIRECT 102.0 11/16/2016 0817      Wt Readings from Last 3 Encounters:  10/14/17 115 lb (52.2 kg)  09/10/17 115 lb 9.6 oz (52.4 kg)  07/08/17 120 lb 9.6 oz (54.7 kg)      Other studies Reviewed: Additional studies/ records that were reviewed today include: ***. Review of the above records demonstrates: ***   ASSESSMENT AND PLAN:  1.  ***   Current medicines are reviewed at  length with the patient today.    Labs/ tests ordered today include: *** Phill Myron. [place] Pugh, ANP, AACC   10/28/2017 8:52 AM    [facility] [address] [place], [place] [address] Phone: [phone]; Fax: [phone]

## 2017-10-29 ENCOUNTER — Ambulatory Visit: Payer: 59 | Admitting: Adult Health

## 2017-11-04 ENCOUNTER — Telehealth: Payer: Self-pay

## 2017-11-06 ENCOUNTER — Other Ambulatory Visit: Payer: Self-pay

## 2017-11-06 DIAGNOSIS — G510 Bell's palsy: Secondary | ICD-10-CM

## 2017-11-06 MED ORDER — ACYCLOVIR 800 MG PO TABS: ORAL_TABLET | ORAL | 1 refills | Status: DC

## 2017-11-29 ENCOUNTER — Other Ambulatory Visit: Payer: Self-pay | Admitting: Family Medicine

## 2017-12-31 ENCOUNTER — Other Ambulatory Visit: Payer: Self-pay | Admitting: Family Medicine

## 2018-02-11 ENCOUNTER — Encounter: Payer: Self-pay | Admitting: Family Medicine

## 2018-02-11 DIAGNOSIS — G510 Bell's palsy: Secondary | ICD-10-CM

## 2018-02-11 MED ORDER — ACYCLOVIR 800 MG PO TABS: ORAL_TABLET | ORAL | 1 refills | Status: DC

## 2018-03-06 ENCOUNTER — Other Ambulatory Visit: Payer: Self-pay | Admitting: Family Medicine

## 2018-03-06 DIAGNOSIS — G510 Bell's palsy: Secondary | ICD-10-CM

## 2018-03-22 ENCOUNTER — Other Ambulatory Visit: Payer: Self-pay | Admitting: Family Medicine

## 2018-03-22 DIAGNOSIS — G510 Bell's palsy: Secondary | ICD-10-CM
